# Patient Record
Sex: Male | Born: 1960 | Race: Black or African American | Hispanic: No | Marital: Married | Smoking: Never smoker
Health system: Southern US, Community
[De-identification: ages and names within clinical notes are randomized; demographics above are authoritative.]

## PROBLEM LIST (undated history)

## (undated) DIAGNOSIS — B9681 Helicobacter pylori [H. pylori] as the cause of diseases classified elsewhere: Secondary | ICD-10-CM

## (undated) DIAGNOSIS — M7710 Lateral epicondylitis, unspecified elbow: Secondary | ICD-10-CM

## (undated) DIAGNOSIS — K269 Duodenal ulcer, unspecified as acute or chronic, without hemorrhage or perforation: Secondary | ICD-10-CM

## (undated) DIAGNOSIS — K219 Gastro-esophageal reflux disease without esophagitis: Secondary | ICD-10-CM

## (undated) DIAGNOSIS — J310 Chronic rhinitis: Secondary | ICD-10-CM

## (undated) DIAGNOSIS — F325 Major depressive disorder, single episode, in full remission: Secondary | ICD-10-CM

## (undated) DIAGNOSIS — K824 Cholesterolosis of gallbladder: Secondary | ICD-10-CM

## (undated) DIAGNOSIS — J3489 Other specified disorders of nose and nasal sinuses: Secondary | ICD-10-CM

## (undated) HISTORY — DX: Major depressive disorder, single episode, in full remission: F32.5

## (undated) HISTORY — DX: Duodenal ulcer, unspecified as acute or chronic, without hemorrhage or perforation: K26.9

## (undated) HISTORY — PX: UPPER GASTROINTESTINAL ENDOSCOPY: SHX188

## (undated) HISTORY — DX: Cholesterolosis of gallbladder: K82.4

## (undated) HISTORY — DX: Helicobacter pylori (H. pylori) as the cause of diseases classified elsewhere: B96.81

## (undated) HISTORY — DX: Lateral epicondylitis, unspecified elbow: M77.10

## (undated) HISTORY — DX: Other specified disorders of nose and nasal sinuses: J34.89

## (undated) HISTORY — DX: Chronic rhinitis: J31.0

---
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
## 1997-08-11 ENCOUNTER — Emergency Department (HOSPITAL_COMMUNITY): Admission: EM | Admit: 1997-08-11 | Discharge: 1997-08-11 | Payer: Self-pay | Admitting: Emergency Medicine

## 1997-11-27 ENCOUNTER — Emergency Department (HOSPITAL_COMMUNITY): Admission: EM | Admit: 1997-11-27 | Discharge: 1997-11-27 | Payer: Self-pay | Admitting: Emergency Medicine

## 1998-06-29 ENCOUNTER — Emergency Department (HOSPITAL_COMMUNITY): Admission: EM | Admit: 1998-06-29 | Discharge: 1998-06-29 | Payer: Self-pay | Admitting: Emergency Medicine

## 1998-06-29 ENCOUNTER — Encounter: Payer: Self-pay | Admitting: Emergency Medicine

## 1998-07-27 ENCOUNTER — Emergency Department (HOSPITAL_COMMUNITY): Admission: EM | Admit: 1998-07-27 | Discharge: 1998-07-27 | Payer: Self-pay | Admitting: Emergency Medicine

## 1998-07-27 ENCOUNTER — Encounter: Payer: Self-pay | Admitting: Emergency Medicine

## 1998-10-01 ENCOUNTER — Emergency Department (HOSPITAL_COMMUNITY): Admission: EM | Admit: 1998-10-01 | Discharge: 1998-10-01 | Payer: Self-pay | Admitting: Emergency Medicine

## 1998-10-02 ENCOUNTER — Encounter: Payer: Self-pay | Admitting: Emergency Medicine

## 1999-08-04 ENCOUNTER — Emergency Department (HOSPITAL_COMMUNITY): Admission: EM | Admit: 1999-08-04 | Discharge: 1999-08-04 | Payer: Self-pay | Admitting: Emergency Medicine

## 2000-09-14 ENCOUNTER — Emergency Department (HOSPITAL_COMMUNITY): Admission: EM | Admit: 2000-09-14 | Discharge: 2000-09-14 | Payer: Self-pay

## 2000-11-09 ENCOUNTER — Emergency Department (HOSPITAL_COMMUNITY): Admission: EM | Admit: 2000-11-09 | Discharge: 2000-11-09 | Payer: Self-pay

## 2001-04-21 ENCOUNTER — Encounter: Admission: RE | Admit: 2001-04-21 | Discharge: 2001-04-21 | Payer: Self-pay | Admitting: Family Medicine

## 2001-07-30 ENCOUNTER — Encounter: Admission: RE | Admit: 2001-07-30 | Discharge: 2001-07-30 | Payer: Self-pay | Admitting: Family Medicine

## 2001-11-15 ENCOUNTER — Emergency Department (HOSPITAL_COMMUNITY): Admission: EM | Admit: 2001-11-15 | Discharge: 2001-11-15 | Payer: Self-pay | Admitting: Emergency Medicine

## 2002-02-13 ENCOUNTER — Emergency Department (HOSPITAL_COMMUNITY): Admission: EM | Admit: 2002-02-13 | Discharge: 2002-02-13 | Payer: Self-pay | Admitting: *Deleted

## 2002-03-03 ENCOUNTER — Encounter: Admission: RE | Admit: 2002-03-03 | Discharge: 2002-03-03 | Payer: Self-pay | Admitting: Family Medicine

## 2002-04-11 ENCOUNTER — Encounter: Admission: RE | Admit: 2002-04-11 | Discharge: 2002-04-11 | Payer: Self-pay | Admitting: Family Medicine

## 2002-05-05 ENCOUNTER — Encounter: Admission: RE | Admit: 2002-05-05 | Discharge: 2002-05-05 | Payer: Self-pay | Admitting: Family Medicine

## 2003-02-18 HISTORY — PX: ELBOW SURGERY: SHX618

## 2003-03-28 ENCOUNTER — Emergency Department (HOSPITAL_COMMUNITY): Admission: EM | Admit: 2003-03-28 | Discharge: 2003-03-29 | Payer: Self-pay | Admitting: Emergency Medicine

## 2003-04-30 ENCOUNTER — Emergency Department (HOSPITAL_COMMUNITY): Admission: AD | Admit: 2003-04-30 | Discharge: 2003-05-01 | Payer: Self-pay | Admitting: Emergency Medicine

## 2003-06-20 ENCOUNTER — Emergency Department (HOSPITAL_COMMUNITY): Admission: EM | Admit: 2003-06-20 | Discharge: 2003-06-20 | Payer: Self-pay | Admitting: Emergency Medicine

## 2003-10-31 ENCOUNTER — Emergency Department (HOSPITAL_COMMUNITY): Admission: EM | Admit: 2003-10-31 | Discharge: 2003-11-01 | Payer: Self-pay | Admitting: Emergency Medicine

## 2003-11-06 ENCOUNTER — Emergency Department (HOSPITAL_COMMUNITY): Admission: EM | Admit: 2003-11-06 | Discharge: 2003-11-06 | Payer: Self-pay | Admitting: Emergency Medicine

## 2004-03-20 ENCOUNTER — Ambulatory Visit: Payer: Self-pay | Admitting: Family Medicine

## 2004-04-19 ENCOUNTER — Ambulatory Visit: Payer: Self-pay | Admitting: Family Medicine

## 2004-08-08 ENCOUNTER — Ambulatory Visit: Payer: Self-pay | Admitting: Family Medicine

## 2004-09-09 ENCOUNTER — Ambulatory Visit: Payer: Self-pay | Admitting: Sports Medicine

## 2004-09-27 ENCOUNTER — Ambulatory Visit: Payer: Self-pay | Admitting: Family Medicine

## 2005-04-11 ENCOUNTER — Ambulatory Visit: Payer: Self-pay | Admitting: Family Medicine

## 2006-04-16 DIAGNOSIS — M7710 Lateral epicondylitis, unspecified elbow: Secondary | ICD-10-CM | POA: Insufficient documentation

## 2006-04-16 DIAGNOSIS — E669 Obesity, unspecified: Secondary | ICD-10-CM

## 2006-04-16 DIAGNOSIS — F3289 Other specified depressive episodes: Secondary | ICD-10-CM | POA: Insufficient documentation

## 2006-04-16 DIAGNOSIS — F329 Major depressive disorder, single episode, unspecified: Secondary | ICD-10-CM

## 2006-04-16 DIAGNOSIS — J310 Chronic rhinitis: Secondary | ICD-10-CM

## 2006-04-16 DIAGNOSIS — F528 Other sexual dysfunction not due to a substance or known physiological condition: Secondary | ICD-10-CM

## 2006-04-16 HISTORY — DX: Lateral epicondylitis, unspecified elbow: M77.10

## 2006-04-16 HISTORY — DX: Chronic rhinitis: J31.0

## 2006-04-26 ENCOUNTER — Emergency Department (HOSPITAL_COMMUNITY): Admission: EM | Admit: 2006-04-26 | Discharge: 2006-04-26 | Payer: Self-pay | Admitting: Emergency Medicine

## 2007-05-27 ENCOUNTER — Emergency Department (HOSPITAL_COMMUNITY): Admission: EM | Admit: 2007-05-27 | Discharge: 2007-05-27 | Payer: Self-pay | Admitting: Emergency Medicine

## 2007-06-04 ENCOUNTER — Emergency Department (HOSPITAL_COMMUNITY): Admission: EM | Admit: 2007-06-04 | Discharge: 2007-06-04 | Payer: Self-pay | Admitting: Emergency Medicine

## 2007-06-04 ENCOUNTER — Telehealth: Payer: Self-pay | Admitting: *Deleted

## 2007-06-07 ENCOUNTER — Ambulatory Visit: Payer: Self-pay | Admitting: Sports Medicine

## 2007-06-07 ENCOUNTER — Ambulatory Visit (HOSPITAL_COMMUNITY): Admission: RE | Admit: 2007-06-07 | Discharge: 2007-06-07 | Payer: Self-pay | Admitting: Family Medicine

## 2007-06-07 DIAGNOSIS — R55 Syncope and collapse: Secondary | ICD-10-CM

## 2007-06-08 ENCOUNTER — Ambulatory Visit: Payer: Self-pay | Admitting: Family Medicine

## 2007-06-09 ENCOUNTER — Encounter: Payer: Self-pay | Admitting: Family Medicine

## 2007-06-16 ENCOUNTER — Ambulatory Visit: Payer: Self-pay | Admitting: Family Medicine

## 2007-06-16 ENCOUNTER — Encounter: Payer: Self-pay | Admitting: Family Medicine

## 2007-06-18 ENCOUNTER — Encounter: Payer: Self-pay | Admitting: *Deleted

## 2008-02-01 ENCOUNTER — Emergency Department (HOSPITAL_COMMUNITY): Admission: EM | Admit: 2008-02-01 | Discharge: 2008-02-01 | Payer: Self-pay | Admitting: Emergency Medicine

## 2008-02-23 ENCOUNTER — Ambulatory Visit: Payer: Self-pay | Admitting: Family Medicine

## 2008-02-23 DIAGNOSIS — R1013 Epigastric pain: Secondary | ICD-10-CM

## 2008-02-23 DIAGNOSIS — J3489 Other specified disorders of nose and nasal sinuses: Secondary | ICD-10-CM

## 2008-02-23 HISTORY — DX: Other specified disorders of nose and nasal sinuses: J34.89

## 2009-08-15 ENCOUNTER — Ambulatory Visit: Payer: Self-pay | Admitting: Family Medicine

## 2009-08-15 ENCOUNTER — Inpatient Hospital Stay (HOSPITAL_COMMUNITY): Admission: EM | Admit: 2009-08-15 | Discharge: 2009-08-18 | Payer: Self-pay | Admitting: Emergency Medicine

## 2009-08-17 ENCOUNTER — Encounter: Payer: Self-pay | Admitting: Family Medicine

## 2009-08-22 ENCOUNTER — Ambulatory Visit: Payer: Self-pay | Admitting: Family Medicine

## 2009-08-22 ENCOUNTER — Encounter: Payer: Self-pay | Admitting: Family Medicine

## 2009-08-22 DIAGNOSIS — A048 Other specified bacterial intestinal infections: Secondary | ICD-10-CM | POA: Insufficient documentation

## 2009-08-22 DIAGNOSIS — D649 Anemia, unspecified: Secondary | ICD-10-CM | POA: Insufficient documentation

## 2009-08-22 LAB — CONVERTED CEMR LAB
HCT: 32.5 % — ABNORMAL LOW (ref 39.0–52.0)
Hemoglobin: 10.8 g/dL — ABNORMAL LOW (ref 13.0–17.0)
MCV: 85.1 fL (ref 78.0–100.0)
Platelets: 254 10*3/uL (ref 150–400)
RBC: 3.82 M/uL — ABNORMAL LOW (ref 4.22–5.81)
WBC: 6.4 10*3/uL (ref 4.0–10.5)

## 2009-09-10 ENCOUNTER — Encounter: Payer: Self-pay | Admitting: Family Medicine

## 2009-11-28 ENCOUNTER — Ambulatory Visit: Payer: Self-pay | Admitting: Family Medicine

## 2010-03-21 NOTE — Assessment & Plan Note (Signed)
Summary: hfu,df   Vital Signs:  Patient profile:   50 year old male Weight:      259.7 pounds Temp:     98.3 degrees F oral Pulse rate:   90 / minute BP sitting:   118 / 77  (right arm) Cuff size:   large  Vitals Entered By: Garen Grams LPN (August 22, 8293 3:17 PM) CC: hfu Is Patient Diabetic? No Pain Assessment Patient in pain? no        Primary Care Provider:  Antoine Primas DO  CC:  hfu.  History of Present Illness: 50 yo male with recent hospitalization for GI bleed and found to have gastritis and duodenal ulcers.  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  Pt is to see GI later today to start proper treatment.  Pt states that he has some tenderness but much better than before as well as dizziness but it has improved a lot.  Pt states he is anxious to get back to work.  Pt is eating well, going to the bathroom regularly and stools look more regular.    Habits & Providers  Alcohol-Tobacco-Diet     Tobacco Status: never  Current Medications (verified): 1)  Protonix 40 Mg Tbec (Pantoprazole Sodium) .... Take 1 Tab By Mouth Daily  Allergies (verified): No Known Drug Allergies  Past History:  Past medical, surgical, family and social histories (including risk factors) reviewed, and no changes noted (except as noted below).  Past Medical History: high risk sexual activity Gastritis and duodenal ulcers H yplori infection treated 7/11.  Followed by GI  Past Surgical History: Reviewed history from 04/16/2006 and no changes required. r elbow tendonitis repair - 2005 - 04/19/2004, R. inguinal hernia repair-1982 - 03/03/2002  Family History: Reviewed history from 04/16/2006 and no changes required. Father-died age 50 unknown cause.  Has 52 sisters and brothers (all healthy per his report)., Mother-living, no medical problems.  Social History: Reviewed history from 06/16/2007 and no changes required. Married, has two  daughtesr Restaurant manager, fast food, [place]).  From [place], has lived in [place]. since 1996.  Works at [employer] (heavy lifting)-lays soccer, no dieting.  Received one blood transfusion in past for unknown reason (in [place]).  No T/A/D's.Smoking Status:  never  Review of Systems       denies fever, chills, nausea, vomiting, diarrhea or constipation   Physical Exam  General:  alert, well-developed, well-nourished, and well-hydrated.   Head:  normocephalic and atraumatic.   Eyes:  normal appearancePERRLA,  Ears:  R ear normal and L ear normal.   Mouth:  pharynx pink and moist, no erythema, no exudates, and no lesions.   Lungs:  Normal respiratory effort, chest expands symmetrically. Lungs are clear to auscultation, no crackles or wheezes. Heart:  Normal rate and regular rhythm. S1 and S2 normal without gallop, murmur, click, rub or other extra sounds. Abdomen:  + BS, ttp epigastrum.  Tenderness is mild.  No guarding, no rebound.  No RUQ pain.  No masses or HSM. 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   Orders: [facility]- Est  Level 4 (62130)  Problem # 2:  UNSPECIFIED ANEMIA (ICD-285.9) CBC Hgb stable at 10.8 today, will monitor.  Orders: CBC-[facility] (86578)  [facility]- Est  Level 4 (99214)  Problem # 3:  SYNCOPE (ICD-780.2) Could secondary to anemia. Has been worked up before with holter monitor but was normal.  if continues then may repeat holter monitor.  Has not completely passed out since his admission which was complicated but dehydration at the time.  Orders: CBC-[facility] (16109) [facility]- Est  Level 4 (60454)  Complete Medication List: 1)  Protonix 40 Mg Tbec (Pantoprazole sodium) .... Take 1 tab by mouth daily  Patient Instructions: 1)  It is good to see you 2)  I am going to draw some blood  today.   3)  I want you to go see the stomach Dr. He will give you the treatment for the infection in your stomach.  4)  I wan to see you again in 3-4 weeks to make sure that you are diong better and the dizziness is not continuing.  5)  I am giving you a note for work to return on Monday.

## 2010-03-21 NOTE — Assessment & Plan Note (Signed)
Summary: epigastric pain   Vital Signs:  Patient profile:   50 year old male Height:      77 inches Weight:      253 pounds BMI:     30.11 Temp:     98.8 degrees F oral Pulse rate:   72 / minute BP sitting:   115 / 75  (left arm) Cuff size:   large  Vitals Entered By: Jimmy Footman, CMA (November 28, 2009 8:51 AM) CC: chronic abdominal pain getting worse and more frequent Comments seen in ED because needed a refill on meds.  on prilosec but getting worse   Primary Care Provider:  Antoine Primas DO  CC:  chronic abdominal pain getting worse and more frequent.  History of Present Illness: epigastric pain: Pt here for increased frequency of epigastric pain x 3-4 days.   Pt has had this pain in past and he has treated it with protonix.  He has minimal epigastric pain when he takes protonix as directed.  He ran out of medication approx 1 month ago and the pain has returned.  He went to the ER for refill on his medication the last time that he ran out of his medication.  Pt denies fever.   No change in bowel or bladder.  Pain located only in epigastric area, pain relieved when he holds pressure over the area.  No blood in stool.  No nausea/vomiting.  Habits & Providers  Alcohol-Tobacco-Diet     Tobacco Status: never  Current Medications (verified): 1)  Protonix 40 Mg Tbec (Pantoprazole Sodium) .... Take 1 Tab By Mouth Daily  Allergies (verified): No Known Drug Allergies  Review of Systems       as per hpi  Physical Exam  General:  VSS Well-developed,well-nourished,in no acute distress; alert,appropriate and cooperative throughout examination Lungs:  Normal respiratory effort, chest expands symmetrically. Lungs are clear to auscultation, no crackles or wheezes. Heart:  Normal rate and regular rhythm. S1 and S2 normal without gallop, murmur, click, rub or other extra sounds. Abdomen:  Bowel sounds positive,abdomen soft and non-tender without masses, organomegaly or hernias noted.   + tenderness in epigastric area with palpation.     Impression & Recommendations:  Problem # 1:  EPIGASTRIC PAIN, CHRONIC (ICD-789.06) Assessment Deteriorated  From history and from physical exam the pt appears to be having a recurrance of gastritis. Gave refill of protonix,  pt to take tums as needed for breakthrough reflux/gastritis pain.  Pt given red flags for return. Pt to return in 2-3 weeks to follow up with Dr. Katrinka Blazing.  Pt given flu shot today.   Orders: [facility]- Est Level  3 (04540)  Complete Medication List: 1)  Protonix 40 Mg Tbec (Pantoprazole sodium) .... Take 1 tab by mouth daily  Patient Instructions: 1)  Return in 2-3 weeks to see Dr. Katrinka Blazing for follow up to insure that reflux has improved.  2)  Take protonix as directed, take tums as needed for breakthrough reflux.  3)  Return if pain doesn't improve with the above medications, or if new or worsening symptoms.   Appended Document: epigastric pain FLU SHOT GIVEN TODAY.Jimmy Footman, CMA  November 28, 2009 11:56 AM    Immunizations Administered:  Influenza Vaccine # 1:    Vaccine Type: Fluvax 3+    Site: right deltoid    Mfr: Sanofi Pasteur    Dose: 0.5 ml    Route: IM    Given by: Jimmy Footman, CMA    Exp. Date:  08/14/2010    Lot #: [id number]    VIS given: 09/11/09 version given November 28, 2009.  Flu Vaccine Consent Questions:    Do you have a history of severe allergic reactions to this vaccine? no    Any prior history of allergic reactions to egg and/or gelatin? no    Do you have a sensitivity to the preservative Thimersol? no    Do you have a past history of Guillan-Barre Syndrome? no    Do you currently have an acute febrile illness? no    Have you ever had a severe reaction to latex? no    Vaccine information given and explained to patient? yes

## 2010-03-21 NOTE — Miscellaneous (Signed)
Summary: Application to [facility]  Clinical Lists Changes [name] dropped form off and would like to have ready tomorrow or Wednesday. Tony Nguyen  September 10, 2009 4:27 PM forms to pcp.Golden Circle RN  September 10, 2009 4:49 PM Pt paperwork completed.  Pt contacted and informed that papers were ready for pickup.  Will pick them up on Wednesday. Tony Nguyen  September 11, 2009 4:37 PM

## 2010-03-26 ENCOUNTER — Encounter: Payer: Self-pay | Admitting: *Deleted

## 2010-05-05 LAB — TYPE AND SCREEN
ABO/RH(D): B POS
Antibody Screen: NEGATIVE

## 2010-05-05 LAB — CBC
HCT: 31.3 % — ABNORMAL LOW (ref 39.0–52.0)
Hemoglobin: 10.6 g/dL — ABNORMAL LOW (ref 13.0–17.0)
Hemoglobin: 10.8 g/dL — ABNORMAL LOW (ref 13.0–17.0)
MCH: 30 pg (ref 26.0–34.0)
MCH: 30.5 pg (ref 26.0–34.0)
MCH: 30.3 pg (ref 26.0–34.0)
MCH: 30.4 pg (ref 26.0–34.0)
MCHC: 33.7 g/dL (ref 30.0–36.0)
MCHC: 34.5 g/dL (ref 30.0–36.0)
MCV: 88.9 fL (ref 78.0–100.0)
MCV: 87.8 fL (ref 78.0–100.0)
Platelets: 165 10*3/uL (ref 150–400)
Platelets: 180 10*3/uL (ref 150–400)
Platelets: 190 10*3/uL (ref 150–400)
RBC: 3.55 MIL/uL — ABNORMAL LOW (ref 4.22–5.81)
RBC: 3.74 MIL/uL — ABNORMAL LOW (ref 4.22–5.81)
RBC: 3.98 MIL/uL — ABNORMAL LOW (ref 4.22–5.81)
RDW: 13.2 % (ref 11.5–15.5)
RDW: 13.7 % (ref 11.5–15.5)
RDW: 13.4 % (ref 11.5–15.5)
RDW: 13.5 % (ref 11.5–15.5)
WBC: 8.5 10*3/uL (ref 4.0–10.5)
WBC: 8.9 10*3/uL (ref 4.0–10.5)

## 2010-05-05 LAB — PROTIME-INR
INR: 1.11 (ref 0.00–1.49)
Prothrombin Time: 14.2 seconds (ref 11.6–15.2)

## 2010-05-05 LAB — DIFFERENTIAL
Basophils Absolute: 0 10*3/uL (ref 0.0–0.1)
Eosinophils Relative: 3 % (ref 0–5)
Eosinophils Relative: 3 % (ref 0–5)
Lymphocytes Relative: 21 % (ref 12–46)
Lymphocytes Relative: 39 % (ref 12–46)
Monocytes Absolute: 0.4 10*3/uL (ref 0.1–1.0)
Monocytes Relative: 5 % (ref 3–12)
Neutro Abs: 4.7 10*3/uL (ref 1.7–7.7)
Neutro Abs: 6.1 10*3/uL (ref 1.7–7.7)
Neutrophils Relative %: 53 % (ref 43–77)

## 2010-05-05 LAB — BASIC METABOLIC PANEL
BUN: 11 mg/dL (ref 6–23)
CO2: 27 mEq/L (ref 19–32)
CO2: 24 mEq/L (ref 19–32)
Calcium: 8.8 mg/dL (ref 8.4–10.5)
Calcium: 8.3 mg/dL — ABNORMAL LOW (ref 8.4–10.5)
Creatinine, Ser: 1.08 mg/dL (ref 0.4–1.5)
Creatinine, Ser: 0.98 mg/dL (ref 0.4–1.5)
GFR calc Af Amer: >60 mL/min (ref >60)
GFR calc non Af Amer: >60 mL/min (ref >60)
GFR calc non Af Amer: >60 mL/min (ref >60)
Glucose, Bld: 101 mg/dL — ABNORMAL HIGH (ref 70–99)
Sodium: 139 mEq/L (ref 135–145)

## 2010-05-05 LAB — COMPREHENSIVE METABOLIC PANEL
AST: 19 U/L (ref 0–37)
Albumin: 3.2 g/dL — ABNORMAL LOW (ref 3.5–5.2)
BUN: 23 mg/dL (ref 6–23)
Chloride: 111 mEq/L (ref 96–112)
Creatinine, Ser: 1.17 mg/dL (ref 0.4–1.5)
GFR calc Af Amer: >60 mL/min (ref >60)
Potassium: 4.2 mEq/L (ref 3.5–5.1)
Total Protein: 6.3 g/dL (ref 6.0–8.3)

## 2010-05-05 LAB — ABO/RH: ABO/RH(D): B POS

## 2010-05-05 LAB — APTT: aPTT: 28 seconds (ref 24–37)

## 2010-07-22 ENCOUNTER — Ambulatory Visit (INDEPENDENT_AMBULATORY_CARE_PROVIDER_SITE_OTHER): Payer: PRIVATE HEALTH INSURANCE | Admitting: Family Medicine

## 2010-07-22 ENCOUNTER — Encounter: Payer: Self-pay | Admitting: Family Medicine

## 2010-07-22 VITALS — BP 135/80 | HR 80 | Temp 98.1°F | Wt 261.0 lb

## 2010-07-22 DIAGNOSIS — K279 Peptic ulcer, site unspecified, unspecified as acute or chronic, without hemorrhage or perforation: Secondary | ICD-10-CM

## 2010-07-22 MED ORDER — SUCRALFATE 1 G PO TABS: 1.0000 g | ORAL_TABLET | Freq: Four times a day (QID) | ORAL | Status: DC

## 2010-07-22 MED ORDER — PANTOPRAZOLE SODIUM 40 MG PO TBEC: 40.0000 mg | DELAYED_RELEASE_TABLET | Freq: Two times a day (BID) | ORAL | Status: DC

## 2010-07-22 NOTE — Patient Instructions (Signed)
Peptic Ulcers Ulcers are small, open craters or sores that develop in the lining of the stomach or the duodenum (the first part of the small intestine). The term peptic ulcer is used to describe both types of ulcers. There are a number of treatments that relieve the discomfort of ulcers. In most cases ulcers do heal.   CAUSES AND COMMON FEATURES OF PEPTIC ULCERS   Peptic ulcers occur only in areas of the digestive system that come in contact with digestive juices. These juices are secreted (given off) by the stomach. They include acid and an enzyme called pepsin that breaks down proteins. Many people with duodenal ulcers have too much digestive juice spilling down from the stomach. Most people with gastric (stomach) ulcers have normal or below normal amounts of stomach acid. Sometimes, when the mucous membrane (protective lining) of the stomach and duodenum does not protect well, this may add to the growth of ulcers. Duodenal ulcers often produces pain in a small area between the breastbone and navel. Pain varies from hunger pain to constant gnawing or burning sensations (feeling). Sometimes the pain is felt during sleep and may awaken the person in the middle of the night. Often the pain occurs two or three hours after eating, when the stomach is empty. Other common symptoms (problems) include overeating for pain relief. Eating relieves the pain of a duodenal ulcer. Gastric ulcer pain may be felt in the same place as the pain of a duodenal ulcer, or slightly higher up. There may also be sensations of feeling full, indigestion, and heartburn. Sometimes pain occurs when the stomach is full. This causes loss of appetite followed by weight loss. HOME CARE INSTRUCTIONS  Use of tobacco products have been found to slow down the healing of an ulcer. STOP SMOKING.   Avoid alcohol, aspirin, and other inflammation (swelling and soreness) reducing drugs. These substances weaken the stomach lining.   Eat regular,  nutritious meals.   Avoid foods that bother you.   Take medications and antacids as directed. Over-the-counter medications are used to neutralize stomach acid. Prescription medications reduce acid secretion, block acid production, or provide a protective coating over the ulcer. If a specific antacid was prescribed, do not switch brands without your caregiver's approval.  Surgery is usually not necessary. Diet and/or drug therapy usually is effective. Surgery may be necessary if perforation, obstruction due to scarring, or uncontrollable bleeding is found, or if severe pain is not otherwise controlled. SEEK IMMEDIATE MEDICAL ATTENTION IF:  You see signs of bleeding. This includes vomiting fresh, bright red blood or passing bloody or tarry, black stools.   You suffer weakness, fatigue, or loss of consciousness. These symptoms can result from severe hemorrhaging (bleeding). Shock may result.   You have sudden, intense, severe abdominal (belly) pain. This is the first sign of a perforation. This would require immediate surgical treatment.   You have intense pain and continued vomiting. This could signal an obstruction of the digestive tract.  Document Released: 02/01/2000 Document Re-Released: 05/02/2008 [facility] Patient Information 2011 [place], [place].

## 2010-07-22 NOTE — Progress Notes (Signed)
  Subjective:    Patient ID: Tony Nguyen, male    DOB: [birth year], 50 y.o.   MRN: [id number]  Abdominal Pain This is a new problem. The current episode started in the past 7 days. The onset quality is gradual. The problem occurs constantly. The problem has been unchanged. The pain is located in the epigastric region. The pain is at a severity of 5/10. The quality of the pain is aching. The abdominal pain does not radiate. Pertinent negatives include no anorexia, arthralgias, belching, constipation, diarrhea, dysuria, fever, flatus, frequency, headaches, hematochezia, hematuria, melena, myalgias, nausea, vomiting or weight loss. Associated symptoms comments: + Dizziness. The pain is aggravated by nothing. The pain is relieved by nothing. He has tried proton pump inhibitors for the symptoms. The treatment provided no relief.      Review of Systems  Constitutional: Negative for fever and weight loss.  Gastrointestinal: Positive for abdominal pain. Negative for nausea, vomiting, diarrhea, constipation, melena, hematochezia, anorexia and flatus.  Genitourinary: Negative for dysuria, frequency and hematuria.  Musculoskeletal: Negative for myalgias and arthralgias.  Neurological: Negative for headaches.       Objective:   Physical Exam  Vitals reviewed. Constitutional: He is oriented to person, place, and time. He appears well-nourished. No distress.  Eyes: Conjunctivae are normal.  Neck: Normal range of motion. Neck supple.  Cardiovascular: Normal rate, regular rhythm and normal heart sounds.   Pulmonary/Chest: Effort normal and breath sounds normal. No respiratory distress. He has no wheezes.  Abdominal: Soft. Bowel sounds are normal. He exhibits no distension and no mass. There is tenderness. There is no rebound and no guarding.       Moderate Epigastric tenderness  Lymphadenopathy:    He has no cervical adenopathy.  Neurological: He is alert and oriented to person, place, and  time. No cranial nerve deficit. He exhibits normal muscle tone. Coordination normal.  Skin: Skin is warm and dry. No rash noted.  Psychiatric: He has a normal mood and affect.          Assessment & Plan:

## 2010-07-22 NOTE — Assessment & Plan Note (Signed)
Likely peptic ulcer given hx of similar episode and diagnosis by EGD.  Will increase Protonix to twice a day, will also start Carafate.  Reviewed Precautions.  Patient would likely benefit from follow up with GI (Dr. Elnoria Howard saw him in the hospital).

## 2010-07-24 ENCOUNTER — Emergency Department (HOSPITAL_COMMUNITY)
Admission: EM | Admit: 2010-07-24 | Discharge: 2010-07-24 | Disposition: A | Discharge status: Home / Self Care | Payer: PRIVATE HEALTH INSURANCE | Attending: Emergency Medicine | Admitting: Emergency Medicine

## 2010-07-24 ENCOUNTER — Emergency Department (HOSPITAL_COMMUNITY): Payer: PRIVATE HEALTH INSURANCE

## 2010-07-24 DIAGNOSIS — R10816 Epigastric abdominal tenderness: Secondary | ICD-10-CM | POA: Insufficient documentation

## 2010-07-24 DIAGNOSIS — R109 Unspecified abdominal pain: Secondary | ICD-10-CM | POA: Insufficient documentation

## 2010-07-24 DIAGNOSIS — Z8711 Personal history of peptic ulcer disease: Secondary | ICD-10-CM | POA: Insufficient documentation

## 2010-07-24 LAB — COMPREHENSIVE METABOLIC PANEL
AST: 15 U/L (ref 0–37)
Albumin: 3.6 g/dL (ref 3.5–5.2)
Alkaline Phosphatase: 65 U/L (ref 39–117)
Chloride: 101 mEq/L (ref 96–112)
GFR calc Af Amer: >60 mL/min (ref >60)
Potassium: 3.8 mEq/L (ref 3.5–5.1)
Total Bilirubin: 0.2 mg/dL — ABNORMAL LOW (ref 0.3–1.2)

## 2010-07-24 LAB — CBC
Platelets: 216 10*3/uL (ref 150–400)
RBC: 4.87 MIL/uL (ref 4.22–5.81)
WBC: 5.4 10*3/uL (ref 4.0–10.5)

## 2010-07-24 LAB — DIFFERENTIAL
Basophils Relative: 1 % (ref 0–1)
Eosinophils Absolute: 0.3 10*3/uL (ref 0.0–0.7)
Lymphs Abs: 2.4 10*3/uL (ref 0.7–4.0)
Neutrophils Relative %: 42 % — ABNORMAL LOW (ref 43–77)

## 2010-07-24 LAB — LIPASE, BLOOD: Lipase: 25 U/L (ref 11–59)

## 2010-07-30 ENCOUNTER — Ambulatory Visit (INDEPENDENT_AMBULATORY_CARE_PROVIDER_SITE_OTHER): Payer: PRIVATE HEALTH INSURANCE | Admitting: Family Medicine

## 2010-07-30 VITALS — BP 113/76 | HR 80 | Temp 98.1°F | Ht 77.0 in | Wt 255.0 lb

## 2010-07-30 DIAGNOSIS — K279 Peptic ulcer, site unspecified, unspecified as acute or chronic, without hemorrhage or perforation: Secondary | ICD-10-CM

## 2010-07-30 MED ORDER — RANITIDINE HCL 300 MG PO TABS: 300.0000 mg | ORAL_TABLET | Freq: Every day | ORAL | Status: DC

## 2010-07-30 NOTE — Progress Notes (Signed)
Subjective:     Tony Nguyen is a 50 y.o. male who presents for evaluation of abdominal pain. Onset was 2 weeks ago. Symptoms have been gradually worsening. The pain is described as burning, and is 6/10 in intensity. Pain is located in the epigastric region without radiation.  Aggravating factors: none.  Alleviating factors: none. Associated symptoms: anorexia and melena. The patient denies constipation, diarrhea, hematochezia, hematuria, nausea and vomiting.  The patient's history has been marked as reviewed and updated as appropriate.  Review of Systems Pertinent items are noted in HPI.     Objective:    BP 113/76  Pulse 80  Temp(Src) 98.1 F (36.7 C) (Oral)  Ht 6\' 5"  (1.956 m)  Wt 255 lb (115.667 kg)  BMI 30.24 kg/m2 General appearance: alert, cooperative and appears stated age Lungs: clear to auscultation bilaterally Heart: regular rate and rhythm, S1, S2 normal, no murmur, click, rub or gallop Abdomen: abnormal findings:  epigastric, RUQ and LUQ TTP without rebound or guarding Rectal: normal tone, normal prostate, no masses or tenderness and hemoccult neg    Assessment:    Abdominal pain, likely secondary to peptic ulcer disease v GERD .    Plan:    The diagnosis was discussed with the patient and evaluation and treatment plans outlined. See orders for lab and imaging studies. Follow up with PCP in 2 weeks.  Cont ppi and added h2 (zantac) 300mg  qhs.  Pt referred to GI for EGD for H Pylori testing and evaluation.  ER warnings given.

## 2010-07-30 NOTE — Patient Instructions (Signed)
It was a pleasure to care for you today.  Please take medication as prescribed.   A new medication was added today and take once at bedtime.  Go immediately to the ED if with any fever, chills, nausea, vomiting, severe abdominal pain, black stools or bloody stools, lightheadedness or fainting. Or vomiting blood.  Please return for a follow up visit in 2 weeks for re evaluation.  You will need to make an appointment with the specialist for an EGD to re screen for infection.  Please follow up on that appointment.

## 2010-07-31 ENCOUNTER — Encounter: Payer: Self-pay | Admitting: *Deleted

## 2010-08-15 ENCOUNTER — Other Ambulatory Visit: Payer: Self-pay | Admitting: Gastroenterology

## 2010-08-15 DIAGNOSIS — R1013 Epigastric pain: Secondary | ICD-10-CM

## 2010-08-16 ENCOUNTER — Ambulatory Visit
Admission: RE | Admit: 2010-08-16 | Discharge: 2010-08-16 | Disposition: A | Discharge status: Home / Self Care | Payer: PRIVATE HEALTH INSURANCE | Source: Ambulatory Visit | Attending: Gastroenterology | Admitting: Gastroenterology

## 2010-08-16 DIAGNOSIS — R1013 Epigastric pain: Secondary | ICD-10-CM

## 2010-09-17 ENCOUNTER — Other Ambulatory Visit: Payer: Self-pay | Admitting: Gastroenterology

## 2010-09-25 ENCOUNTER — Encounter (INDEPENDENT_AMBULATORY_CARE_PROVIDER_SITE_OTHER): Payer: Self-pay | Admitting: Surgery

## 2010-09-25 ENCOUNTER — Ambulatory Visit (INDEPENDENT_AMBULATORY_CARE_PROVIDER_SITE_OTHER): Payer: PRIVATE HEALTH INSURANCE | Admitting: Surgery

## 2010-09-25 VITALS — BP 122/84 | HR 60 | Temp 96.8°F | Ht 72.0 in | Wt 255.4 lb

## 2010-09-25 DIAGNOSIS — K824 Cholesterolosis of gallbladder: Secondary | ICD-10-CM

## 2010-09-25 DIAGNOSIS — B9681 Helicobacter pylori [H. pylori] as the cause of diseases classified elsewhere: Secondary | ICD-10-CM

## 2010-09-25 DIAGNOSIS — K269 Duodenal ulcer, unspecified as acute or chronic, without hemorrhage or perforation: Secondary | ICD-10-CM

## 2010-09-25 DIAGNOSIS — A048 Other specified bacterial intestinal infections: Secondary | ICD-10-CM

## 2010-09-25 HISTORY — DX: Cholesterolosis of gallbladder: K82.4

## 2010-09-25 HISTORY — DX: Helicobacter pylori (H. pylori) as the cause of diseases classified elsewhere: B96.81

## 2010-09-25 NOTE — Progress Notes (Signed)
Subjective:     Patient ID: Tony Nguyen, male   DOB: [birth year], 50 y.o.   MRN: [id number]  HPI  Patient has a history of epigastric pain going on for a year. He had a severe episode of pain with nausea last year, so he went to the emergency room. He had loose bowels with black and red changes. He was admitted. And Dr. Elnoria Howard did endoscopy and revealed gastritis. He was placed on antacid meds. It seemed to resolve.  However this year he is getting repeated episodes. It is a burning pain. It does not radiate. It is not associated with eating. It often happens on an empty stomach. He denies any significant heartburn. No reflux. No dysphasia. He normally has one bowel movement a day. No major changes in his bowels. No hematochezia this time. The episodes are happening at night & at work. It's not associated with eating. He normally can eat any food he wants.  No problem with greasy foods, dairy, spicy foods.  He saw Dr. Evette Cristal with [facility] gastroenterology. He had an ultrasound done which showed a polyp within the gallbladder and another mass in there that was probably sludge. He an EGD which revealed duodenal bulb ulcers. He had biopsy showing gastritis and Helicobacter pylori positive. He is taken antibiotics and antacids for 2 weeks. That helped. He stopped all this. The pains come back. He really does not want to take medicines indefinitely.  Dr. Evette Cristal sent the patient [place] for consideration of the abnormal gallbladder.  No sick contacts. Internal history. He had an appendectomy but no other bowel surgery. He is otherwise a moderately active. No cardiac history. He does not smoke.  Review of Systems  Constitutional: Negative for fever, chills and diaphoresis.  HENT: Negative for sore throat and trouble swallowing.   Eyes: Negative for photophobia and visual disturbance.  Respiratory: Negative for choking and shortness of breath.   Cardiovascular: Negative for chest pain and palpitations.    Gastrointestinal: Positive for nausea and abdominal pain. Negative for vomiting, diarrhea, constipation, blood in stool, abdominal distention, anal bleeding and rectal pain.  Genitourinary: Negative for dysuria, urgency, difficulty urinating, penile pain and testicular pain.  Musculoskeletal: Negative for myalgias, back pain, joint swelling, arthralgias and gait problem.  Skin: Negative for color change and rash.  Neurological: Negative for dizziness, speech difficulty, weakness and numbness.  Hematological: Negative for adenopathy.  Psychiatric/Behavioral: Negative for hallucinations, confusion and agitation.       Objective:   Physical Exam  Constitutional: He is oriented to person, place, and time. He appears well-developed and well-nourished. No distress.  HENT:  Head: Normocephalic.  Mouth/Throat: Oropharynx is clear and moist. No oropharyngeal exudate.  Eyes: Conjunctivae and EOM are normal. Pupils are equal, round, and reactive to light. No scleral icterus.  Neck: Normal range of motion. Neck supple. No tracheal deviation present.  Cardiovascular: Normal rate, regular rhythm and intact distal pulses.   Pulmonary/Chest: Effort normal and breath sounds normal. No respiratory distress.  Abdominal: Soft. He exhibits no distension and no mass. There is no rebound and no guarding. Hernia confirmed negative in the right inguinal area and confirmed negative in the left inguinal area.       Moderate epigastric greater than right paramedian tenderness to palpation.  No tenderness over her right upper quadrant.  No Murphy sign. Small umbilical hernia.  Musculoskeletal: Normal range of motion. He exhibits no tenderness.  Lymphadenopathy:    He has no cervical adenopathy.  Right: No inguinal adenopathy present.       Left: No inguinal adenopathy present.  Neurological: He is alert and oriented to person, place, and time. No cranial nerve deficit. He exhibits normal muscle tone.  Coordination normal.  Skin: Skin is warm and dry. No rash noted. He is not diaphoretic. No erythema. No pallor.  Psychiatric: He has a normal mood and affect. His behavior is normal. Judgment and thought content normal.   Studies: As noted above. 7 mm abnormal focusing gallbladder. Looks more like a stone to me that they think is more consistent with sludge. A small gallbladder polyp. Abdominal x-rays negative. Endoscopy as above noted.    Assessment:     [see below]    Plan:     #1: duodenal bulb ulcers and gastritis. I strongly encouraged him to resume his proton pump inhibitor. Call Dr. Arlana Hove who agreed with this. Dr. didn't want to see him again since the patient is still having epigastric symptoms. I caution that the patient may need antacid meds for several months if not indefinitely since this is her repeated problem. Avoid nonsteroidals.  #2: gallbladder polyp and abnormal mass. Most likely this is benign. I discussed with numerous partners. I gave the patient her options. He was aggressive option is to do cholecystectomy since that will determine what is inside the gallbladder US to remove it. There's a very small increased risk of cancer. Catching it early would be helpful. At the very least it should be followed with a repeat Ultrasound in 6 months. Patient is very concerned about having an abnormal gallbladder & wished to have removed.  I did discuss removal gallbladder. I think is reasonable for a single site approach. Technique, risks, benefits, alternatives discussed. Most important, I noted at this point probably not effect his symptoms at all as I didn't think the gallbladder issues are asymptomatic. He understands that. We'll work according to see convenient time.

## 2010-09-25 NOTE — Patient Instructions (Addendum)
1. See Handout on Gallbladder surgery for polyps   2. Ulcer Disease (Peptic Ulcer, Gastric Ulcer, Duodenal Ulcer) You have an ulcer. This is in the first part of your small bowel, the duodenum (duodenal ulcer). An ulcer is a break in the lining of the stomach or duodenum. The ulcer causes erosion into the deeper tissue. CAUSES The stomach has a lining to protect itself from the acid that digests food. The lining can be damaged in two main ways:  The Helico Pylori bacteria (H. Pyolori) can infect the lining of the stomach and cause ulcers.   Nonsteroidal, anti-inflammatory medications (NSAIDS) can cause gastric ulcerations.   Smoking tobacco can increase the acid in the stomach. This can lead to ulcers, and will impair healing of ulcers.  Other factors, such as alcohol use and stress may contribute to ulcer formation. Rarely, a tumor or cancer can cause an ulcer.  SYMPTOMS The problems (symptoms) of ulcer disease are usually a burning or gnawing of the mid-upper belly (abdomen). This is often worse on an empty stomach and may get better with food. This may be associated with feeling sick to your stomach (nausea), bloating, and vomiting. If the ulcer results in bleeding, it can cause:  Black, tarry stools.   Vomiting of bright red blood.   Vomiting coffee-ground-looking materials.  With severe bleeding, there may be loss of consciousness and shock.  DIAGNOSIS Learning what is wrong (diagnosis) is usually made based upon your history and an exam. Medications are so effective that further tests may not be necessary. If needed, other tests may include:  Blood tests, x-rays, or heart tests. These are done to be sure that no other conditions are causing your symptoms.   X-rays (Barium studies) such as an upper GI series.   Most commonly, an upper GI endoscopy can confirm your diagnosis. This is when a flexible tube is passed through the mouth (using sedative medication). The tube is used to  look at the inside of esophagus, stomach, and small bowel. Abnormal pieces of tissue may be removed to examine under the microscope (biopsy).  TREATMENT Bleeding from ulcers can usually be treated via endoscopy. Rarely, surgery is needed for ulcers when bleeding cannot be stopped. Surgery is needed if the ulcer goes through the wall of the stomach or duodenum.  After any bleeding is stopped, medications are the main treatment:  If your ulcer was caused by the bacteria H. Pylori, then you will need antibiotics to kill the infection. Usually, a program involving more than one antibiotic, and a medicine for stomach acid, is prescribed.   Medicine to decrease acid production is used for almost all ulcers. Your caregiver will make a recommendation. They will also tell you how long you must use the medication.   Stop using any medications or substances that may have contributed to your ulcer (alcohol, tobacco, caffeine, for example).   Medications are available that protect the lining of the bowel.  HOME CARE INSTRUCTIONS Continue regular work and usual activities unless advised otherwise by your caregiver.  Avoid tobacco, alcohol, and caffeine. Tobacco use will decrease and slow the rates of healing.   Avoid foods that seem to aggravate or cause discomfort.   There are many over-the-counter products available to control stomach acid and other symptoms. Discuss these with your caregiver before using them. DO NOT substitute over-the-counter medications for prescription medications without discussing with your caregiver.   Special diets are not usually needed.   Keep any follow-up appointments and  blood tests, as directed.  SEEK MEDICAL CARE IF:  Your pain or other ulcer symptoms do not improve within a few days of starting treatment.   You develop diarrhea. This can be a complication of certain treatments.   You have ongoing indigestion or heartburn, even if your main ulcer symptoms are  improved.  SEEK IMMEDIATE MEDICAL CARE IF:  You develop bright red, rectal bleeding; dark black, tarry stools; or vomit blood.   You become light-headed, weak, have fainting episodes, or become sweaty, cold and clammy.   You experience severe abdominal pain not controlled by medications. DO NOT take pain medications unless ordered by your caregiver.  Document Released: 11/13/2004 Document Re-Released: 05/02/2008 [facility] Patient Information 2011 [place], [place].

## 2010-10-09 ENCOUNTER — Encounter (HOSPITAL_COMMUNITY)
Admission: RE | Admit: 2010-10-09 | Discharge: 2010-10-09 | Disposition: A | Discharge status: Home / Self Care | Payer: PRIVATE HEALTH INSURANCE | Source: Ambulatory Visit | Attending: Surgery | Admitting: Surgery

## 2010-10-09 LAB — CBC
HCT: 37.2 % — ABNORMAL LOW (ref 39.0–52.0)
MCH: 28.2 pg (ref 26.0–34.0)
MCHC: 33.6 g/dL (ref 30.0–36.0)
RDW: 13.3 % (ref 11.5–15.5)

## 2010-10-09 LAB — SURGICAL PCR SCREEN: Staphylococcus aureus: NEGATIVE

## 2010-10-15 ENCOUNTER — Ambulatory Visit (HOSPITAL_COMMUNITY)
Admission: RE | Admit: 2010-10-15 | Discharge: 2010-10-16 | Disposition: A | Discharge status: Home / Self Care | Payer: PRIVATE HEALTH INSURANCE | Source: Ambulatory Visit | Attending: Surgery | Admitting: Surgery

## 2010-10-15 ENCOUNTER — Ambulatory Visit (HOSPITAL_COMMUNITY): Payer: PRIVATE HEALTH INSURANCE

## 2010-10-15 ENCOUNTER — Other Ambulatory Visit (INDEPENDENT_AMBULATORY_CARE_PROVIDER_SITE_OTHER): Payer: Self-pay | Admitting: Surgery

## 2010-10-15 DIAGNOSIS — K219 Gastro-esophageal reflux disease without esophagitis: Secondary | ICD-10-CM | POA: Insufficient documentation

## 2010-10-15 DIAGNOSIS — Z01812 Encounter for preprocedural laboratory examination: Secondary | ICD-10-CM | POA: Insufficient documentation

## 2010-10-15 DIAGNOSIS — K429 Umbilical hernia without obstruction or gangrene: Secondary | ICD-10-CM | POA: Insufficient documentation

## 2010-10-15 DIAGNOSIS — K811 Chronic cholecystitis: Secondary | ICD-10-CM

## 2010-10-15 DIAGNOSIS — K824 Cholesterolosis of gallbladder: Secondary | ICD-10-CM | POA: Insufficient documentation

## 2010-10-15 DIAGNOSIS — E669 Obesity, unspecified: Secondary | ICD-10-CM | POA: Insufficient documentation

## 2010-10-15 HISTORY — PX: CHOLECYSTECTOMY: SHX55

## 2010-10-17 NOTE — Op Note (Signed)
Tony Nguyen, Tony Nguyen NO.:  [id number]  MEDICAL RECORD NO.:  [id number]  LOCATION:  5532                         FACILITY:  [facility]  PHYSICIAN:  Tony Sportsman, MD     DATE OF BIRTH:  11/23/1960  DATE OF PROCEDURE:  10/15/2010 DATE OF DISCHARGE:                              OPERATIVE REPORT   PRIMARY CARE PHYSICIAN:  Tony Primas, DO  GASTROENTEROLOGISTS: 1. Tony Shiver, MD 2. Tony Hawks Elnoria Howard, MD  SURGEON:  Tony Sportsman, MD  ASSISTANT:  RN.  PREOPERATIVE DIAGNOSES: 1. Gallbladder polyps with probable chronic cholecystitis. 2. Possible umbilical hernia.  POSTOPERATIVE DIAGNOSIS:  Gallbladder polyps with probable chronic cholecystitis.  PROCEDURE PERFORMED: 1. Laparoscopic cholecystectomy with intraoperative cholangiogram     (single site technique). 2. Primary repair of umbilical hernia.  ANESTHESIA: 1. General anesthesia. 2. Local anesthetic and a field block.  SPECIMENS:  Gallbladder. DRAINS:  None.  ESTIMATED BLOOD LOSS:  Less than 30 mL.  COMPLICATIONS:  None apparent.  INDICATIONS:  Mr. Tony Nguyen is a 50 year old male with history of intermittent epigastric pains.  He has been evaluated by Gastroenterology for history of gastritis and some duodenitis that seems to be rather controlled.  He had a workup showing polyp within the gallbladder and probable sludge.  Gases, reflux, and gastritis seemed to have improved when asked but his postprandial symptoms have not and he has abnormality of his gallbladder.  I was consulted and I recommended consideration of cholecystectomy.  The anatomy or physiology of hepatobiliary and pancreatic function was discussed.  Pathophysiology of gallbladder polyps and chronic cholecystolithiasis was discussed.  Options were discussed. Recommendations were made for removal of gallbladder and study bile ducts.  Techniques, risks, benefits, and alternatives were discussed. Single site laparoscopic and open  techniques were discussed of umbilical hernia to help with future risk of incarceration and strangulation were discussed as well.  Risks, benefits, and alternatives were discussed. Questions were answered and he agreed to proceed.  OPERATIVE FINDINGS:  He had some minor adhesions to the gallbladder and some dilation of the gallbladder concerning for chronic cholecystitis. His biliary system had rather classic anatomy with no evidence of any obstruction or abnormalities.  He had a 1-cm umbilical hernia through the stalk.  DESCRIPTION OF PROCEDURE:  Informed consent was confirmed.  The patient underwent general anesthesia without any difficulty.  He was positioned supine with arms tucked.  He had voided prior to coming to the operating room.  He had sequential compression devices active during the entire care.  His abdomen was clipped, prepped, and draped in sterile fashion. A surgical time-out confirmed our plan.  I placed a #5 long bariatric port through the umbilical stalk hernia using a modified Hasson cut-down technique through a curvilinear incision through the superior circumference in the umbilical fold.  I did camera inspection and it revealed no injury.  I did carbon dioxide insufflation.  There were no adhesions to the anterior abdominal wall. I therefore placed a 5-mm port in the left upper aspect of the wound and a 5-mm grasper in the right inferior aspect of the wound.  I turned attention towards the right upper quadrant.  I found the gallbladder fundus elevated cephalad.  I think he had some greater omentum and mesocolonic adhesions to most of the anterior wall of the gallbladder concerning for chronic cholecystitis.  I elevated that up and freed those off using controlled blunt and focused ultrasonic/harmonic dissection.  Then I was able to free the peritoneum from the gallbladder on its anterior medial wall and posterolateral wall.  I isolated and skeletonized a  dominant posterior cystic artery branch and a smaller right anterior cystic artery branch after skeletonizing and getting a 360-degree good critical view.  I ligated using ultrasound dissection.  I found the lymph node of [place].  I isolated and ligated that and kept that on the gallbladder side.  I did further dissection and freed the proximal third of the gallbladder off the liver bed to get a good critical view of the infundibulum and the cystic duct as it narrowed down.  I made sure I had a good critical view.  I placed a clip in the infundibulum and did a partial cystic ductotomy. I took a #5 [place] through a subperiosteal puncture site and placed it in the cystic duct.  Initially, I could not get it advanced.  I re-did the ductotomy about 6 mm more proximally and was able to achieve the cannulization better.  This was most likely consistent with that was between the two ductotomies.  I ran a cholangiogram using dilute radiopaque contrast and continuous fluoroscopy.  Contrast flowed well from the side branch and refluxed up the common hepatic duct into the right and left intrahepatic chains.  It easily flowed down the common bile duct across the normal ampulla into the normal duodenal bulb without any major abnormalities.  I went ahead and removed the cholangiographic catheter.  I placed 4 clips on the cystic duct just proximal to the most proximal cystic ductotomy.  I completed cystic duct transection using ultrasonic/harmonic dissection.  I freed the gallbladder from its medial attachments on the liver bed just to find a wisp to help elevate the gallbladder fossa.  I used touch point cautery to ensure good hemostasis on the liver bed.  The liver was rather friable and it did have few small peels of that but then I was able to control that by controlling the oozing with the cautery.  I did copious irrigation with excellent hemostasis.  Duodenal bulb, colon, and  other organs appeared normal and not harmed.  I went ahead and removed the gallbladder out the umbilical wound.  I freed the umbilical stalk off the fascia.  I reapproximated the umbilical hernia using 0-Vicryl interrupted stitches with good result.  I closed the skin using 4-0 Monocryl stitch.  Sterile dressings were applied.  The patient was extubated and sent to the recovery room in stable condition.  I discussed the postoperative care with the patient in the office and just prior to surgery in the holding area.  I will discuss with his family as well.     Tony Sportsman, MD     SCG/MEDQ  D:  10/15/2010  T:  10/15/2010  Job:  [id number]  cc:   Tony Nguyen, M.D. Tony Primas, DO  Electronically Signed by Karie Soda MD on 10/17/2010 01:33:58 PM

## 2010-11-04 ENCOUNTER — Encounter (INDEPENDENT_AMBULATORY_CARE_PROVIDER_SITE_OTHER): Payer: Self-pay | Admitting: Surgery

## 2010-11-04 ENCOUNTER — Ambulatory Visit (INDEPENDENT_AMBULATORY_CARE_PROVIDER_SITE_OTHER): Payer: PRIVATE HEALTH INSURANCE | Admitting: Surgery

## 2010-11-04 DIAGNOSIS — K824 Cholesterolosis of gallbladder: Secondary | ICD-10-CM

## 2010-11-04 DIAGNOSIS — K811 Chronic cholecystitis: Secondary | ICD-10-CM

## 2010-11-04 NOTE — Progress Notes (Signed)
Subjective:     Patient ID: Tony Nguyen, male   DOB: 22-Feb-1960, 50 y.o.   MRN: [id number]  HPI  Reason for visit: Followup after laparoscopic cholecystectomy 10/15/2010  Pathology: Chronic cholecystitis. Mucosal fold ?polyp  The patient comes today feeling well. No longer is having the epigastric pains. No nausea nor vomiting. He is having daily bowel movements which is an improvement. No fevers chills nor sweats. Energy level is good. Minimal soreness. He is wanting to get back to work but notes he has a fair amount of heavy lifting and wishes to be careful.  Review of Systems  Constitutional: Negative for fever, chills and diaphoresis.  HENT: Negative for nosebleeds, sore throat, facial swelling, mouth sores, trouble swallowing and ear discharge.   Eyes: Negative for photophobia, discharge and visual disturbance.  Respiratory: Negative for choking, chest tightness, shortness of breath and stridor.   Cardiovascular: Negative for chest pain and palpitations.  Gastrointestinal: Negative for nausea, vomiting, diarrhea, constipation, blood in stool, abdominal distention, anal bleeding and rectal pain.  Genitourinary: Negative for dysuria, urgency, difficulty urinating and testicular pain.  Musculoskeletal: Negative for myalgias, back pain, arthralgias and gait problem.  Skin: Negative for color change, pallor, rash and wound.  Neurological: Negative for dizziness, speech difficulty, weakness, numbness and headaches.  Hematological: Negative for adenopathy. Does not bruise/bleed easily.  Psychiatric/Behavioral: Negative for hallucinations, confusion and agitation.       Objective:   Physical Exam  Constitutional: He is oriented to person, place, and time. He appears well-developed and well-nourished. No distress.  HENT:  Head: Normocephalic.  Mouth/Throat: Oropharynx is clear and moist. No oropharyngeal exudate.  Eyes: Conjunctivae and EOM are normal. Pupils are equal, round,  and reactive to light. No scleral icterus.  Neck: Normal range of motion. Neck supple. No tracheal deviation present.  Cardiovascular: Normal rate, normal heart sounds and intact distal pulses.   Pulmonary/Chest: Effort normal. No respiratory distress.  Abdominal: Soft. He exhibits no distension. There is no tenderness. Hernia confirmed negative in the right inguinal area and confirmed negative in the left inguinal area.       Umb incision clean with normal healing ridge.  No hernias  Musculoskeletal: Normal range of motion. He exhibits no tenderness.  Lymphadenopathy:    He has no cervical adenopathy.       Right: No inguinal adenopathy present.       Left: No inguinal adenopathy present.  Neurological: He is alert and oriented to person, place, and time. No cranial nerve deficit. He exhibits normal muscle tone. Coordination normal.  Skin: Skin is warm and dry. No rash noted. He is not diaphoretic. No erythema. No pallor.  Psychiatric: He has a normal mood and affect. His behavior is normal. Judgment and thought content normal.       Assessment:     Postoperative day #20 status post laparoscopic cholecystectomy. Improving well.    Plan:     Increase activity as tolerated.  Advance diet as tolerated. Avoid heavy and spicy foods.  Followup of duodenal peptic ulcer disease with gastroenterology as needed.  I'm glad he's had a good response. I think he is getting out of a window of any serious post operative risks or issues. He can followup p.r.n. He expressed appreciation.

## 2010-11-12 LAB — CBC
HCT: 43
Hemoglobin: 14.5
MCHC: 33.7
Platelets: 195
RDW: 13.4

## 2010-11-12 LAB — DIFFERENTIAL
Basophils Absolute: 0
Basophils Relative: 1
Eosinophils Absolute: 0.3
Eosinophils Relative: 5
Lymphocytes Relative: 40
Monocytes Absolute: 0.5

## 2010-11-12 LAB — POCT I-STAT, CHEM 8
BUN: 15
Calcium, Ion: 1.2
HCT: 45
Hemoglobin: 15.3
Sodium: 139
TCO2: 26

## 2012-03-08 ENCOUNTER — Encounter (HOSPITAL_COMMUNITY): Payer: Self-pay | Admitting: Emergency Medicine

## 2012-03-08 ENCOUNTER — Emergency Department (HOSPITAL_COMMUNITY)
Admission: EM | Admit: 2012-03-08 | Discharge: 2012-03-08 | Disposition: A | Discharge status: Home / Self Care | Payer: 59 | Attending: Emergency Medicine | Admitting: Emergency Medicine

## 2012-03-08 DIAGNOSIS — Y939 Activity, unspecified: Secondary | ICD-10-CM | POA: Insufficient documentation

## 2012-03-08 DIAGNOSIS — S29019A Strain of muscle and tendon of unspecified wall of thorax, initial encounter: Secondary | ICD-10-CM

## 2012-03-08 DIAGNOSIS — S239XXA Sprain of unspecified parts of thorax, initial encounter: Secondary | ICD-10-CM | POA: Insufficient documentation

## 2012-03-08 DIAGNOSIS — Y9289 Other specified places as the place of occurrence of the external cause: Secondary | ICD-10-CM | POA: Insufficient documentation

## 2012-03-08 DIAGNOSIS — Z79899 Other long term (current) drug therapy: Secondary | ICD-10-CM | POA: Insufficient documentation

## 2012-03-08 DIAGNOSIS — Y990 Civilian activity done for income or pay: Secondary | ICD-10-CM | POA: Insufficient documentation

## 2012-03-08 DIAGNOSIS — X500XXA Overexertion from strenuous movement or load, initial encounter: Secondary | ICD-10-CM | POA: Insufficient documentation

## 2012-03-08 MED ORDER — CYCLOBENZAPRINE HCL 10 MG PO TABS: 10.0000 mg | ORAL_TABLET | Freq: Three times a day (TID) | ORAL | Status: DC | PRN

## 2012-03-08 MED ORDER — HYDROCODONE-ACETAMINOPHEN 5-325 MG PO TABS: 1.0000 | ORAL_TABLET | Freq: Four times a day (QID) | ORAL | Status: DC | PRN

## 2012-03-08 MED ORDER — IBUPROFEN 800 MG PO TABS: 800.0000 mg | ORAL_TABLET | Freq: Three times a day (TID) | ORAL | Status: DC | PRN

## 2012-03-08 NOTE — ED Provider Notes (Signed)
Medical screening examination/treatment/procedure(s) were performed by non-physician practitioner and as supervising physician I was immediately available for consultation/collaboration.   Celene Kras, MD 03/08/12 [phone]

## 2012-03-08 NOTE — ED Provider Notes (Signed)
History     CSN: [id number]  Arrival date & time 03/08/12  1156   First MD Initiated Contact with Patient 03/08/12 1349      Chief Complaint  Patient presents with  . Shoulder Pain    (Consider location/radiation/quality/duration/timing/severity/associated sxs/prior treatment) HPI Patient presents to the emergency department with right posterior shoulder pain, along the shoulder blade.  Patient states that the pain started after he finished working yesterday.  Patient denies any other injury.  The patient denies shortness of breath, chest pain, nausea, vomiting, abdominal pain, back pain fever or dizziness.  Patient states that movement makes the pain, worse.  Patient denies taking anything prior to route for his discomfort. History reviewed. No pertinent past medical history.  Past Surgical History  Procedure Date  . Cholecystectomy 10/15/10  . Elbow surgery 2005    right  . Appendectomy 1981  . Upper gastrointestinal endoscopy     History reviewed. No pertinent family history.  History  Substance Use Topics  . Smoking status: Never Smoker   . Smokeless tobacco: Not on file  . Alcohol Use: No      Review of Systems All other systems negative except as documented in the HPI. All pertinent positives and negatives as reviewed in the HPI. Allergies  Review of patient's allergies indicates no known allergies.  Home Medications   Current Outpatient Rx  Name  Route  Sig  Dispense  Refill  . ACETAMINOPHEN 500 MG PO TABS   Oral   Take 500 mg by mouth every 6 (six) hours as needed. pain         . MULTI-VITAMIN/MINERALS PO TABS   Oral   Take 1 tablet by mouth daily.         Marland Kitchen OMEPRAZOLE 20 MG PO CPDR   Oral   Take 20 mg by mouth daily.           BP 142/76  Pulse 72  Temp 98.5 F (36.9 C) (Oral)  SpO2 100%  Physical Exam  Nursing note and vitals reviewed. Constitutional: He is oriented to person, place, and time. He appears well-developed and  well-nourished.  Cardiovascular: Normal rate, regular rhythm and normal heart sounds.   Pulmonary/Chest: Effort normal and breath sounds normal.  Musculoskeletal:       Cervical back: He exhibits normal range of motion.       Back:  Neurological: He is alert and oriented to person, place, and time. He exhibits normal muscle tone. Coordination normal.  Skin: Skin is warm and dry. No rash noted.    ED Course  Procedures (including critical care time)  Patient has most likely thoracic/shoulder strain.  Patient is advised to use heat on the area.  Told to return if any worsening in his condition.  Also advised followup with his primary care Dr. for recheck.  MDM          Carlyle Dolly, PA-C 03/08/12 [phone]

## 2012-03-08 NOTE — ED Notes (Signed)
States yesterday had acute onset back left shoulder pain, states pain is sharp and constant since yesterday, denies injury, states has not happened before, states when moving arm backwards, increase in pain

## 2012-05-06 ENCOUNTER — Ambulatory Visit (INDEPENDENT_AMBULATORY_CARE_PROVIDER_SITE_OTHER): Payer: 59 | Admitting: Family Medicine

## 2012-05-06 VITALS — BP 153/83 | HR 67 | Temp 98.3°F | Ht 72.0 in | Wt 256.8 lb

## 2012-05-06 MED ORDER — OMEPRAZOLE 20 MG PO CPDR: DELAYED_RELEASE_CAPSULE | ORAL | Status: DC

## 2012-05-06 NOTE — Assessment & Plan Note (Signed)
Chronic condition for patient Consider Dx of PUD Possible H. Pylori. Low likelyhood of pancreatitis or malignancy. WIll restart PPI (prilosec 40mg  for 2 wks then 20mg  daily) and told pt to stop NSAID. If not improving may need to be treated for H. Pylori F/u PCP PRN

## 2012-05-06 NOTE — Progress Notes (Signed)
Abdul-Rashid I Amsler is a 52 y.o. male who presents to [facility] today for abdominal pain   Abdominal pain: started 1 week ago. Gradual onset. Left work early due to pain yesterday. Epigastric. Relieved w/ food and when pushes on epigastric region. Not exacerbated by certain positions, laying down, eating. Worse when feeling like have to pee badly. Improves w/ palpation of epigastric region. Denies fevers, n/v/d/c, hematochezia, hematemasis, hematuria, dysuria, unintentional wt loss. Denies tobacco and ETOH use. Ran out of prilosec in January. Takes 800mg  ibuprofen once a week.    The following portions of the patient's history were reviewed and updated as appropriate: allergies, current medications, past medical history, family and social history, and problem list.  Patient is a nonsmoker   No past medical history on file.  ROS as above otherwise neg.    Medications reviewed. Current Outpatient Prescriptions  Medication Sig Dispense Refill  . ibuprofen (ADVIL,MOTRIN) 800 MG tablet Take 1 tablet (800 mg total) by mouth every 8 (eight) hours as needed for pain.  30 tablet  0  . Multiple Vitamins-Minerals (MULTIVITAMIN WITH MINERALS) tablet Take 1 tablet by mouth daily.      Marland Kitchen omeprazole (PRILOSEC) 20 MG capsule Take 20 mg by mouth daily.       No current facility-administered medications for this visit.    Exam: BP 153/83  Pulse 67  Temp(Src) 98.3 F (36.8 C) (Oral)  Ht 6' (1.829 m)  Wt 256 lb 12.8 oz (116.484 kg)  BMI 34.82 kg/m2 Gen: Well NAD HEENT: EOMI,  MMM Lungs: CTABL Nl WOB Heart: RRR no MRG Abd: Mild tenderness to palpation in the epigastric region. NABS. No rebound, no pain at Mcburny's point or murphy's sign.  Exts: Non edematous BL  LE, warm and well perfused.   No results found for this or any previous visit (from the past 72 hour(s)).

## 2012-05-06 NOTE — Patient Instructions (Addendum)
You likely have irritation of your stomach from too much acid Please restart your acid pill Take 40mg  of Prilosec daily for 2 weeks then decrease to 20mg  daily thereafter If your pain continues please come back for additional testing as you may also have another H. Pylori infection Please stop taking the ibuprofen for the next 2 weeks. Try tylenol instead   Ulcer Disease You have an ulcer. This may be in your stomach (gastric ulcer) or in the first part of your small bowel, the duodenum (duodenal ulcer). An ulcer is a break in the lining of the stomach or duodenum. The ulcer causes erosion into the deeper tissue. CAUSES  The stomach has a lining to protect itself from the acid that digests food. The lining can be damaged in two main ways:  The Helico Pylori bacteria (H. Pyolori) can infect the lining of the stomach and cause ulcers.  Nonsteroidal, anti-inflammatory medications (NSAIDS) can cause gastric ulcerations.  Smoking tobacco can increase the acid in the stomach. This can lead to ulcers, and will impair healing of ulcers. Other factors, such as alcohol use and stress may contribute to ulcer formation. Rarely, a tumor or cancer can cause an ulcer.  SYMPTOMS  The problems (symptoms) of ulcer disease are usually a burning or gnawing of the mid-upper belly (abdomen). This is often worse on an empty stomach and may get better with food. This may be associated with feeling sick to your stomach (nausea), bloating, and vomiting. If the ulcer results in bleeding, it can cause:  Black, tarry stools.  Vomiting of bright red blood.  Vomiting coffee-ground-looking materials. With severe bleeding, there may be loss of consciousness and shock.  DIAGNOSIS  Learning what is wrong (diagnosis) is usually made based upon your history and an exam. Medications are so effective that further tests may not be necessary. If needed, other tests may include:  Blood tests, x-rays, or heart tests. These are  done to be sure that no other conditions are causing your symptoms.  X-rays (Barium studies) such as an upper GI series.  Most commonly, an upper GI endoscopy can confirm your diagnosis. This is when a flexible tube is passed through the mouth (using sedative medication). The tube is used to look at the inside of esophagus, stomach, and small bowel. Abnormal pieces of tissue may be removed to examine under the microscope (biopsy). TREATMENT  Bleeding from ulcers can usually be treated via endoscopy. Rarely, surgery is needed for ulcers when bleeding cannot be stopped. Surgery is needed if the ulcer goes through the wall of the stomach or duodenum.  After any bleeding is stopped, medications are the main treatment:  If your ulcer was caused by the bacteria H. Pylori, then you will need antibiotics to kill the infection. Usually, a program involving more than one antibiotic, and a medicine for stomach acid, is prescribed.  Medicine to decrease acid production is used for almost all ulcers. Your caregiver will make a recommendation. They will also tell you how long you must use the medication.  Stop using any medications or substances that may have contributed to your ulcer (alcohol, tobacco, caffeine, for example).  Medications are available that protect the lining of the bowel. HOME CARE INSTRUCTIONS   Continue regular work and usual activities unless advised otherwise by your caregiver.  Avoid tobacco, alcohol, and caffeine. Tobacco use will decrease and slow the rates of healing.  Avoid foods that seem to aggravate or cause discomfort.  There are many over-the-counter  products available to control stomach acid and other symptoms. Discuss these with your caregiver before using them. DO NOT substitute over-the-counter medications for prescription medications without discussing with your caregiver.  Special diets are not usually needed.  Keep any follow-up appointments and blood tests, as  directed. SEEK MEDICAL CARE IF:   Your pain or other ulcer symptoms do not improve within a few days of starting treatment.  You develop diarrhea. This can be a complication of certain treatments.  You have ongoing indigestion or heartburn, even if your main ulcer symptoms are improved. SEEK IMMEDIATE MEDICAL CARE IF:   You develop bright red, rectal bleeding; dark black, tarry stools; or vomit blood.  You become light-headed, weak, have fainting episodes, or become sweaty, cold and clammy.  You experience severe abdominal pain not controlled by medications. DO NOT take pain medications unless ordered by your caregiver. Document Released: 11/13/2004 Document Revised: 04/28/2011 Document Reviewed: 10/01/2006 [facility] Patient Information 2013 [place], [place].

## 2012-11-18 ENCOUNTER — Encounter: Payer: Self-pay | Admitting: Family Medicine

## 2012-11-18 ENCOUNTER — Ambulatory Visit (INDEPENDENT_AMBULATORY_CARE_PROVIDER_SITE_OTHER): Payer: Commercial Managed Care - PPO | Admitting: Family Medicine

## 2012-11-18 VITALS — BP 126/78 | HR 82 | Ht 72.0 in | Wt 251.0 lb

## 2012-11-18 DIAGNOSIS — R7309 Other abnormal glucose: Secondary | ICD-10-CM

## 2012-11-18 DIAGNOSIS — A048 Other specified bacterial intestinal infections: Secondary | ICD-10-CM

## 2012-11-18 DIAGNOSIS — Z23 Encounter for immunization: Secondary | ICD-10-CM

## 2012-11-18 DIAGNOSIS — D649 Anemia, unspecified: Secondary | ICD-10-CM

## 2012-11-18 DIAGNOSIS — K269 Duodenal ulcer, unspecified as acute or chronic, without hemorrhage or perforation: Secondary | ICD-10-CM

## 2012-11-18 DIAGNOSIS — F3289 Other specified depressive episodes: Secondary | ICD-10-CM

## 2012-11-18 DIAGNOSIS — F329 Major depressive disorder, single episode, unspecified: Secondary | ICD-10-CM

## 2012-11-18 DIAGNOSIS — R252 Cramp and spasm: Secondary | ICD-10-CM

## 2012-11-18 DIAGNOSIS — R1013 Epigastric pain: Secondary | ICD-10-CM

## 2012-11-18 DIAGNOSIS — B9681 Helicobacter pylori [H. pylori] as the cause of diseases classified elsewhere: Secondary | ICD-10-CM

## 2012-11-18 LAB — COMPREHENSIVE METABOLIC PANEL
ALT: 15 U/L (ref 0–53)
AST: 16 U/L (ref 0–37)
Albumin: 4 g/dL (ref 3.5–5.2)
Alkaline Phosphatase: 57 U/L (ref 39–117)
Glucose, Bld: 89 mg/dL (ref 70–99)
Potassium: 4.4 mEq/L (ref 3.5–5.3)
Sodium: 140 mEq/L (ref 135–145)
Total Bilirubin: 0.5 mg/dL (ref 0.3–1.2)
Total Protein: 7.1 g/dL (ref 6.0–8.3)

## 2012-11-18 LAB — POCT GLYCOSYLATED HEMOGLOBIN (HGB A1C): Hemoglobin A1C: 5.9

## 2012-11-18 LAB — CBC
MCH: 28.6 pg (ref 26.0–34.0)
MCHC: 34.4 g/dL (ref 30.0–36.0)
MCV: 83.3 fL (ref 78.0–100.0)
Platelets: 233 10*3/uL (ref 150–400)
RBC: 4.68 MIL/uL (ref 4.22–5.81)

## 2012-11-18 NOTE — Patient Instructions (Addendum)
Tony Nguyen,  Thank you for coming in to see me today. Your exam is normal.  1. We are getting some screening blood work today as well as blood work to evaluate your leg cramping.  2. Please call to schedule your colonoscopy.  3. Regarding your stomach this chronic pain actually seems to be muscle pain, for this I recommend tylenol. Also colonoscopy to make sure there is no colon process.    I am sorry to hear of you separation, but glad that you are making positive moves towards peace in your life. [facility] [place] is a [employer] in our community. Please call if you are interested in personal or family services.  I will be in touch with blood work results.   Plan to f/u in 2 months to re-evaluate your stomach pain.   Dr. Armen Pickup

## 2012-11-18 NOTE — Assessment & Plan Note (Signed)
Presumably secondary to duodenal ulcers and gastritis.  Plan to recheck CBC today.

## 2012-11-18 NOTE — Assessment & Plan Note (Signed)
History depressive disorder. Negative screen today. Currently separating from his wife.  Provided information sheet on [facility] [place] for counseling services if needed.

## 2012-11-18 NOTE — Assessment & Plan Note (Signed)
This is a new problem patient with elevated blood sugars in the past however screening A1c was normal today. Perhaps this is related to poor sleep hygiene and excessive work schedule.  Counseled patient on recommended sleep time of 7-8 hours. Will check electrolytes.

## 2012-11-18 NOTE — Progress Notes (Signed)
  Subjective:    Patient ID: Tony Nguyen, male    DOB: 1960/04/20, 52 y.o.   MRN: [id number]  Abdominal Pain   52 year old male with a history of chronic epigastric pain and EGD confirmed duodenal ulcers In 2011 presents for physical to discuss the following:  #1 chronic epigastric pain: Patient still has sharp epigastric pain that does not radiate. No associated weight loss, nausea vomiting, reflux, diarrhea, constipation, melena. Patient status post cholecystectomy. He continues to takes PPI. He reports that the pain is improved when he applies pressure to the epigastric area.  #2 posterior thigh cramping: Patient reports intermittent severe posterior thigh cramping at night. He reports that he works a 70+ work week. He sleeps to 4 hours a night due to schedule. He reports that his diet is well rounded. He does have a history of hyperglycemia but no history of diabetes, hypertension or hyperlipidemia. He is a chronic nonsmoker.  #3 separation from wife: Patient is currently in the process of separating from his wife. He reports stress related to situation. His 2 young children age 57 and 39. He feels that the separation is the best thing for him and his family. He denies depressive symptoms.  #4 health maintenance patient is due for colonoscopy and flu shot. Review of Systems  Gastrointestinal: Positive for abdominal pain.   Patient Information Form: Screening and ROS  AUDIT-C Score: 0 Do you feel safe in relationships? yes PHQ-2:negative  Review of Symptoms  General:  Negative for nexplained weight loss, fever Skin: Negative for new or changing mole, sore that won't heal HEENT: Positive for trouble sitting sometimes. Negative for trouble hearing, ringing in ears, mouth sores, hoarseness, change in voice, dysphagia. CV:  Negative for chest pain, dyspnea, edema, palpitations Resp: Negative for cough, dyspnea, hemoptysis GI: Negative for nausea, vomiting, diarrhea,  constipation, abdominal pain, melena, hematochezia. GU: Negative for dysuria, incontinence, urinary hesitance, hematuria, vaginal or penile discharge, polyuria, sexual difficulty, lumps in testicle or breasts MSK: Positive for muscle cramps in the posterior thighs that occurred night. Negative for joint pain or swelling Neuro: Positive for headaches. Negative for weakness, numbness, dizziness, passing out/fainting Psych: Negative for depression, anxiety, memory problems    Objective: BP 126/78  Pulse 82  Ht 6' (1.829 m)  Wt 251 lb (113.853 kg)  BMI 34.03 kg/m2    Physical Exam  Constitutional: He is oriented to person, place, and time. He appears well-developed and well-nourished. No distress.  HENT:  Head: Normocephalic and atraumatic.  Right Ear: External ear normal.  Left Ear: External ear normal.  Nose: Nose normal.  Mouth/Throat: Oropharynx is clear and moist.  Eyes: Pupils are equal, round, and reactive to light.  Neck: Normal range of motion. Neck supple. No thyromegaly present.  Cardiovascular: Normal rate, regular rhythm, normal heart sounds and intact distal pulses.  Exam reveals no gallop and no friction rub.   No murmur heard. Pulmonary/Chest: Effort normal and breath sounds normal.  Abdominal: Soft. Bowel sounds are normal. He exhibits no distension and no mass. There is no tenderness. There is no rebound and no guarding.  Musculoskeletal: Normal range of motion. He exhibits no edema and no tenderness.  Neurological: He is alert and oriented to person, place, and time. No cranial nerve deficit.  Skin: Skin is warm and dry.  Psychiatric: He has a normal mood and affect. His behavior is normal. Judgment and thought content normal.      Assessment & Plan:

## 2012-11-19 ENCOUNTER — Encounter: Payer: Self-pay | Admitting: Family Medicine

## 2013-01-07 ENCOUNTER — Ambulatory Visit (INDEPENDENT_AMBULATORY_CARE_PROVIDER_SITE_OTHER): Payer: Commercial Managed Care - PPO | Admitting: Family Medicine

## 2013-01-07 ENCOUNTER — Other Ambulatory Visit: Payer: Self-pay | Admitting: Family Medicine

## 2013-01-07 ENCOUNTER — Encounter: Payer: Self-pay | Admitting: Family Medicine

## 2013-01-07 VITALS — BP 120/71 | HR 80 | Temp 99.0°F | Ht 72.0 in | Wt 246.0 lb

## 2013-01-07 DIAGNOSIS — L299 Pruritus, unspecified: Secondary | ICD-10-CM | POA: Insufficient documentation

## 2013-01-07 MED ORDER — HYDROXYZINE HCL 10 MG PO TABS: 10.0000 mg | ORAL_TABLET | Freq: Three times a day (TID) | ORAL | Status: DC | PRN

## 2013-01-07 MED ORDER — DIPHENHYDRAMINE-ZINC ACETATE 1-0.1 % EX CREA: TOPICAL_CREAM | Freq: Three times a day (TID) | CUTANEOUS | Status: DC | PRN

## 2013-01-07 NOTE — Assessment & Plan Note (Signed)
A: localized L back itching with evidence of excoriation. Dry skin. P: Topical emollient Hydroxyzine prn Benadryl cream prn

## 2013-01-07 NOTE — Patient Instructions (Signed)
Mr. Humble,  Thank you for coming in today. Please take the prescribed medication for itching. There is no rash.  Keeping skin well moisturized will help minimize itching: Eucerin, cerave or Vaseline are good moisturizers.   Call if itching persist.  Dr. Armen Pickup

## 2013-01-07 NOTE — Progress Notes (Signed)
  Subjective:    Patient ID: Tony Nguyen, male    DOB: 1960/08/28, 52 y.o.   MRN: [id number]  HPI 52 yo M presents for SD visit:  1. Itching: L back x one week. No travel, pets, new products, new medications, rash. Itch occurs throughout day and a bit worse at night. Daughters do not itch. No treatment, just scratching.    Review of Systems As per HPI     Objective:   Physical Exam BP 120/71  Pulse 80  Temp(Src) 99 F (37.2 C) (Oral)  Ht 6' (1.829 m)  Wt 246 lb (111.585 kg)  BMI 33.36 kg/m2 General appearance: alert, cooperative and no distress Skin: excoriation - trunk L posterior. No trunk or L axillary adenopathy. Skin is overall moderately xerotic.     Assessment & Plan:

## 2013-05-12 ENCOUNTER — Ambulatory Visit (INDEPENDENT_AMBULATORY_CARE_PROVIDER_SITE_OTHER): Payer: Commercial Managed Care - PPO | Admitting: Family Medicine

## 2013-05-12 ENCOUNTER — Encounter: Payer: Self-pay | Admitting: Family Medicine

## 2013-05-12 VITALS — BP 114/73 | HR 76 | Temp 98.1°F | Ht 72.0 in | Wt 259.9 lb

## 2013-05-12 DIAGNOSIS — R42 Dizziness and giddiness: Secondary | ICD-10-CM | POA: Insufficient documentation

## 2013-05-12 MED ORDER — MECLIZINE HCL 32 MG PO TABS: 32.0000 mg | ORAL_TABLET | Freq: Three times a day (TID) | ORAL | Status: DC | PRN

## 2013-05-12 NOTE — Progress Notes (Signed)
Patient ID: Tony Nguyen, male   DOB: 08/15/1960, 53 y.o.   MRN: [id number]    Subjective: HPI: Patient is a 53 y.o. male presenting to clinic today for dizziness x3 days.  Feels room spinning around him. Blurred vision when trying to focus.  Has had this before, resolved on its own. No problems with with blood pressure. Not orthostatic today He reports a headache (frontal); he has taken Tylenol and Aleve.  Has a history of seasonal allergies. Takes Zyrtec. Reports nausea and epigastric pain. No vomiting. No fevers, no recent URI or flu. Reports some intermittent black vision when he is really dizzy, resolves on its own.  No new medications, no tinnitus, no ear pain.    History Reviewed: Never smoker. Health Maintenance: UTD on medications  ROS: Please see HPI above.  Objective: Office vital signs reviewed. BP 114/73  Pulse 76  Temp(Src) 98.1 F (36.7 C) (Oral)  Ht 6' (1.829 m)  Wt 259 lb 14.4 oz (117.89 kg)  BMI 35.24 kg/m2  Physical Examination:  General: Awake, alert. NAD HEENT: Atraumatic, normocephalic. MMM. Posterior pharynx erythematous. TM slightly budging. Mild frontal tenderness. EOM intact. Neck: No masses palpated. No LAD Pulm: CTAB, no wheezes Cardio: RRR, no murmurs appreciated Extremities: No edema Neuro: Strength and sensation grossly intact. CN intact without and weakness. No nystagmus.  Assessment: 53 y.o. male with vertigo  Plan: See Problem List and After Visit Summary

## 2013-05-12 NOTE — Assessment & Plan Note (Addendum)
DDx includes BPPV, eustachian tube dysfunction, seasonal allergy flare, viral infection, labyrinthitis. 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 Can return to work on 05/16/13. - Meclizine prn symptoms - Water and rest - F/u if not improved, spikes fever, has syncope or worsening headache

## 2013-05-12 NOTE — Patient Instructions (Signed)
Vertigo Vertigo means you feel like you or your surroundings are moving when they are not. Vertigo can be dangerous if it occurs when you are at work, driving, or performing difficult activities.  CAUSES  Vertigo occurs when there is a conflict of signals sent to your brain from the visual and sensory systems in your body. There are many different causes of vertigo, including:  Infections, especially in the inner ear.  A bad reaction to a drug or misuse of alcohol and medicines.  Withdrawal from drugs or alcohol.  Rapidly changing positions, such as lying down or rolling over in bed.  A migraine headache.  Decreased blood flow to the brain.  Increased pressure in the brain from a head injury, infection, tumor, or bleeding. SYMPTOMS  You may feel as though the world is spinning around or you are falling to the ground. Because your balance is upset, vertigo can cause nausea and vomiting. You may have involuntary eye movements (nystagmus). DIAGNOSIS  Vertigo is usually diagnosed by physical exam. If the cause of your vertigo is unknown, your caregiver may perform imaging tests, such as an MRI scan (magnetic resonance imaging). TREATMENT  Most cases of vertigo resolve on their own, without treatment. Depending on the cause, your caregiver may prescribe certain medicines. If your vertigo is related to body position issues, your caregiver may recommend movements or procedures to correct the problem. In rare cases, if your vertigo is caused by certain inner ear problems, you may need surgery. HOME CARE INSTRUCTIONS   Follow your caregiver's instructions.  Avoid driving.  Avoid operating heavy machinery.  Avoid performing any tasks that would be dangerous to you or others during a vertigo episode.  Tell your caregiver if you notice that certain medicines seem to be causing your vertigo. Some of the medicines used to treat vertigo episodes can actually make them worse in some people. SEEK  IMMEDIATE MEDICAL CARE IF:   Your medicines do not relieve your vertigo or are making it worse.  You develop problems with talking, walking, weakness, or using your arms, hands, or legs.  You develop severe headaches.  Your nausea or vomiting continues or gets worse.  You develop visual changes.  A family member notices behavioral changes.  Your condition gets worse. MAKE SURE YOU:  Understand these instructions.  Will watch your condition.  Will get help right away if you are not doing well or get worse. Document Released: 11/13/2004 Document Revised: 04/28/2011 Document Reviewed: 08/22/2010 [employer] Patient Information 2014 [employer].  

## 2013-05-13 ENCOUNTER — Other Ambulatory Visit: Payer: Self-pay | Admitting: Family Medicine

## 2013-05-13 MED ORDER — MECLIZINE HCL 25 MG PO TABS: 25.0000 mg | ORAL_TABLET | Freq: Three times a day (TID) | ORAL | Status: DC | PRN

## 2013-09-05 ENCOUNTER — Encounter: Payer: Self-pay | Admitting: Family Medicine

## 2013-09-05 ENCOUNTER — Ambulatory Visit (INDEPENDENT_AMBULATORY_CARE_PROVIDER_SITE_OTHER): Payer: Commercial Managed Care - PPO | Admitting: Family Medicine

## 2013-09-05 VITALS — BP 133/87 | HR 77 | Temp 98.2°F | Wt 248.0 lb

## 2013-09-05 DIAGNOSIS — R5383 Other fatigue: Secondary | ICD-10-CM

## 2013-09-05 DIAGNOSIS — R5381 Other malaise: Secondary | ICD-10-CM

## 2013-09-05 DIAGNOSIS — R42 Dizziness and giddiness: Secondary | ICD-10-CM

## 2013-09-05 LAB — CBC
HCT: 36.9 % — ABNORMAL LOW (ref 39.0–52.0)
Hemoglobin: 12.5 g/dL — ABNORMAL LOW (ref 13.0–17.0)
MCH: 28.9 pg (ref 26.0–34.0)
MCHC: 33.9 g/dL (ref 30.0–36.0)
MCV: 85.2 fL (ref 78.0–100.0)
PLATELETS: 194 10*3/uL (ref 150–400)
RBC: 4.33 MIL/uL (ref 4.22–5.81)
RDW: 13.8 % (ref 11.5–15.5)
WBC: 5.3 10*3/uL (ref 4.0–10.5)

## 2013-09-05 MED ORDER — DOXYCYCLINE HYCLATE 100 MG PO TABS: 100.0000 mg | ORAL_TABLET | Freq: Two times a day (BID) | ORAL | Status: DC

## 2013-09-05 MED ORDER — ONDANSETRON 8 MG PO TBDP
4.0000 mg | ORAL_TABLET | Freq: Three times a day (TID) | ORAL | Status: DC | PRN
Start: 2013-09-05 — End: 2015-05-02

## 2013-09-05 NOTE — Progress Notes (Signed)
   Subjective:    Patient ID: Tony Nguyen, male    DOB: 03/16/1960, 53 y.o.   MRN: [id number]  HPI: Pt presents to clinic for headache, fatigue, joint pain, and dizziness. Current symptoms started several days ago "out of the blue." - describes a feeling of lightheadedness with intermittent blurred vision but NO room-spinning sensation - joint pain is in knees, elbows, and shoulders, aching - denies recent camping, insect bites, rashes - denies syncope, chest pain, SOB, N/V, sweating, or ringing in his ears; also denies falls - nothing makes symptoms better and he has not taken any medications for it or any regular medications, currently - standing makes his symptoms worse - pt reports history of vertigo but states this feels different; has taken meclazine in the past (per office note March 2015, symptoms suggested more room-spinning, etc)  Review of Systems: As above.     Objective:   Physical Exam BP 133/87  Pulse 77  Temp(Src) 98.2 F (36.8 C) (Oral)  Wt 248 lb (112.492 kg) Gen: well-appearing adult male in NAD, but does appear fatigued HEENT: [place]/AT, EOMI, MMM, TM's clear bilaterally  Mild posterior oropharynx erythema, no significant anterior cervical lymphadenopathy  Some moderate tenderness to direct palpation over frontal sinuses but not over maxillary sinuses Cardio: RRR, no murmur appreciated Pulm: CTAB, no wheezes, normal WOB MSK: normal ROM to all four extremities, no joint effusions or deformities noted  Some achiness / increased fatigue worse with active ROM after exam Neuro: alert / oriented, no gross focal deficits, with intact strength / sensation throughout extremities  No reproduction of symptoms with changes in position other than standing  Pt able to stand / walk without assistance though does report symptoms with standing Skin: NO rashes noted to palms, soles, hands / feet, extremities, or trunk     Assessment & Plan:  52yo with hx of vertigo but  now with vague symptoms of fatigue and dizziness of sudden onset - uncertain if this could reflect a viral sinusitis / otitis or URI-type illness, given sinus tenderness - possible RMSF given bodyaches, headaches, and weakness but NO reported recent contact with ticks or excursions outdoors, no rash - possible autonomic instability but unlikely given suddenness of onset and history otherwise unremarkable for frank neuro deficits - does not appear dehydrated and doubt hypotension playing a factor; neuro exam otherwise normal  Plan: broad differential as above, but will check labs to eval for end-organ function and anemia (BMP and CBC) - will f/u results as necessary; note and results to be forwarded to Dr. McDiarmid (PCP) Lorain ChildesFYI - given possibility of RMSF will treat with doxycycline for 14 days, though doubt this is the cause and will defer checking titres - instructed pt to f/u by the end of this week if he continues to feel bad or if his symptoms worsen - otherwise recommended supportive care (push fluids, Tylenol for fever / body aches, etc)  The above note reflects an HPI obtained with the assistance of A. Goins, MS4, with additions / clarifications based on my independent interview. The above also reflects my independent exam and assessment/plan.  Bobbye Mortonhristopher M Jameson Morrow, MD PGY-3, [facility] 09/05/2013, 10:46 PM

## 2013-09-05 NOTE — Patient Instructions (Signed)
Thank you for coming in, today!  I'm not sure what is causing your symptoms. It could be from dehydration and/or a sinus-type infection, or it could be "[facility] Spotted Fever."  You can read more about it, below. You don't have the rash we expect, but people don't always see a rash or a tick bite.  You should take a medicine called doxycycline twice a day for 2 weeks. It may make you nauseated -- if it does, you can take Zofran as needed every 6-8 hours.  If you feel worse over the next several days, come back to be re-evaluated Friday or next week. It may take a few weeks to a month or so to feel completely better.  Come back to see Dr. McDiarmid as you need, otherwise. Please feel free to call with any questions or concerns at any time, at [phone]. --Dr. Casper Harrison  [facility] Fever [facility] Spotted Fever (RMSF) is the oldest known tick-borne disease of people in the [place]. This disease was named because it was first described among people in the [facility] area who had an illness characterized by a rash with red-purple-black spots. This disease is caused by a rickettsia (Rickettsia rickettsii), a bacteria carried by the tick. The [facility] wood tick and the American dog tick, acquire and transmit the RMSF bacteria (pictures NOT actual size). When a larval, nymphal or adult tick feeds on an infected rodent or larger animal, the tick can become infected. Infected adult ticks then feed on people who may then get RMSF. The tick transmits the disease to humans during a prolonged period of feeding that lasts many hours, days or even a couple weeks. The bite is painless and frequently goes unnoticed. An infected male tick may also pass the rickettsial bacteria to her eggs that then may mature to be infected adult ticks. The rickettsia that causes RMSF can also get into a person's body through damaged skin. A tick bite is not necessary. People can get RMSF if  they crush a tick and get it's blood or body fluids on their skin through a small cut or sore.  DIAGNOSIS Diagnosis is made by laboratory tests.  TREATMENT Treatment is with antibiotics (medications that kill rickettsia and other bacteria). Immediate treatment usually prevents death. GEOGRAPHIC RANGE This disease was reported only in the [facility] until 1931. RMSF has more recently been described among individuals in all states except [place], [place] and [place]. The highest reported incidences of RMSF now occur among residents of [place], [place], [place] and [address]. TIME OF YEAR  Most cases are diagnosed during late spring and summer when ticks are most active. However, especially in the warmer [place] states, a few cases occur during the winter. SYMPTOMS   Symptoms of RMSF begin from 2 to 14 days after a tick bite. The most common early symptoms are fever, muscle aches and headache followed by nausea (feeling sick to your stomach) or vomiting.  The RMSF rash is typically delayed until 3 or more days after symptom onset, and eventually develops in 9 of 10 infected patients by the 5th day of illness. If the disease is not treated it can cause death. If you get a fever, headache, muscle aches, rash, nausea or vomiting within 2 weeks of a possible tick bite or exposure you should see your caregiver immediately. PREVENTION Ticks prefer to hide in shady, moist ground litter. They can often be found above the ground clinging to tall grass, brush,  shrubs and low tree branches. They also inhabit lawns and gardens, especially at the edges of woodlands and around old stone walls. Within the areas where ticks generally live, no naturally vegetated area can be considered completely free of infected ticks. The best precaution against RMSF is to avoid contact with soil, leaf litter and vegetation as much as possible in tick infested areas. For those who enjoy gardening or walking in their yards,  clear brush and mow tall grass around houses and at the edges of gardens. This may help reduce the tick population in the immediate area. Applications of chemical insecticides by a licensed professional in the spring (late May) and Fall (September) will also control ticks, especially in heavily infested areas. Treatment will never get rid of all the ticks. Getting rid of small animal populations that host ticks will also decrease the tick population. When working in the garden, Mattel[place]uning shrubs, or handling soil and vegetation, wear light-colored protective clothing and gloves. Spot-check often to prevent ticks from reaching the skin. Ticks cannot jump or fly. They will not drop from an above-ground perch onto a passing animal. Once a tick gains access to human skin it climbs upward until it reaches a more protected area. For example, the back of the knee, groin, navel, armpit, ears or nape of the neck. It then begins the slow process of embedding itself in the skin. Campers, hikers, field workers, and others who spend time in wooded, brushy or tall grassy areas can avoid exposure to ticks by using the following precautions:  Wear light-colored clothing with a tight weave to spot ticks more easily and prevent contact with the skin.  Wear long pants tucked into socks, long-sleeved shirts tucked into pants and enclosed shoes or boots along with insect repellent.  Spray clothes with insect repellent containing either DEET or Permethrin. Only DEET can be used on exposed skin. Follow the manufacturer's directions carefully.  Wear a hat and keep long hair pulled back.  Stay on cleared, well-worn trails whenever possible.  Spot-check yourself and others often for the presence of ticks on clothes. If you find one, there are likely to be others. Check thoroughly.  Remove clothes after leaving tick-infested areas. If possible, wash them to eliminate any unseen ticks. Check yourself, your children and any pets  from head to toe for the presence of ticks.  Shower and shampoo. You can greatly reduce your chances of contracting RMSF if you remove attached ticks as soon as possible. Regular checks of the body, including all body sites covered by hair (head, armpits, genitals), allow removal of the tick before rickettsial transmission. To remove an attached tick, use a forceps or tweezers to detach the intact tick without leaving mouth parts in the skin. The tick bite wound should be cleansed after tick removal. Remember the most common symptoms of RMSF are fever, muscle aches, headache and nausea or vomiting with a later onset of rash. If you get these symptoms after a tick bite and while living in an area where RMSF is found, RMSF should be suspected. If the disease is not treated, it can cause death. See your caregiver immediately if you get these symptoms. Do this even if not aware of a tick bite. Document Released: 05/18/2000 Document Revised: 04/28/2011 Document Reviewed: 01/08/2009 [facility] Patient Information 2015 [place]Care, [place]. This information is not intended to replace advice given to you by your health care provider. Make sure you discuss any questions you have with your health care provider.

## 2013-09-06 ENCOUNTER — Encounter: Payer: Self-pay | Admitting: Family Medicine

## 2013-09-06 LAB — BASIC METABOLIC PANEL
BUN: 15 mg/dL (ref 6–23)
CHLORIDE: 105 meq/L (ref 96–112)
CO2: 27 meq/L (ref 19–32)
CREATININE: 1.17 mg/dL (ref 0.50–1.35)
Calcium: 9.1 mg/dL (ref 8.4–10.5)
Glucose, Bld: 98 mg/dL (ref 70–99)
POTASSIUM: 4 meq/L (ref 3.5–5.3)
Sodium: 139 mEq/L (ref 135–145)

## 2013-10-07 ENCOUNTER — Encounter: Payer: Self-pay | Admitting: Family Medicine

## 2013-10-07 ENCOUNTER — Ambulatory Visit (INDEPENDENT_AMBULATORY_CARE_PROVIDER_SITE_OTHER): Payer: Commercial Managed Care - PPO | Admitting: Family Medicine

## 2013-10-07 VITALS — BP 109/75 | HR 76 | Temp 98.5°F | Ht 72.0 in | Wt 242.9 lb

## 2013-10-07 DIAGNOSIS — R51 Headache: Secondary | ICD-10-CM

## 2013-10-07 DIAGNOSIS — M25569 Pain in unspecified knee: Secondary | ICD-10-CM

## 2013-10-07 DIAGNOSIS — M25561 Pain in right knee: Secondary | ICD-10-CM

## 2013-10-07 DIAGNOSIS — R42 Dizziness and giddiness: Secondary | ICD-10-CM

## 2013-10-07 DIAGNOSIS — M25562 Pain in left knee: Secondary | ICD-10-CM | POA: Insufficient documentation

## 2013-10-07 DIAGNOSIS — R519 Headache, unspecified: Secondary | ICD-10-CM | POA: Insufficient documentation

## 2013-10-07 LAB — CBC
HCT: 42 % (ref 39.0–52.0)
HEMOGLOBIN: 14.5 g/dL (ref 13.0–17.0)
MCH: 29.2 pg (ref 26.0–34.0)
MCHC: 34.5 g/dL (ref 30.0–36.0)
MCV: 84.7 fL (ref 78.0–100.0)
PLATELETS: 231 10*3/uL (ref 150–400)
RBC: 4.96 MIL/uL (ref 4.22–5.81)
RDW: 13.9 % (ref 11.5–15.5)
WBC: 6.1 10*3/uL (ref 4.0–10.5)

## 2013-10-07 MED ORDER — ACETAMINOPHEN 500 MG PO TABS: 1000.0000 mg | ORAL_TABLET | Freq: Three times a day (TID) | ORAL | Status: DC | PRN

## 2013-10-07 MED ORDER — CYCLOBENZAPRINE HCL 5 MG PO TABS: 5.0000 mg | ORAL_TABLET | Freq: Three times a day (TID) | ORAL | Status: DC | PRN

## 2013-10-07 NOTE — Assessment & Plan Note (Signed)
Headaches appear to be experienced primarily in the occipital region of his head. They appeared to be musculoskeletal in nature. However it is also very likely that they are possibly due to hypovolemic changes his body experiences while at work. I strongly encouraged him to continue to keep his fluids up while at work. Also, with the possible musculoskeletal nature of his symptoms he is reporting--I have prescribed him a short course of Flexeril 5 mg. I took time to ensure that he understood the precautions with this medication especially while working with/around heavy machinery.

## 2013-10-07 NOTE — Progress Notes (Signed)
Patient ID: Tony Nguyen, male   DOB: 17-Apr-1960, 53 y.o.   MRN: [id number]  Mickie Hillier, MD, MS Phone: [phone]  Subjective:  Chief complaint  Pt Here for HEADACHE   Onset: 2 days  Location: occipital  Quality: 6-7/10 Frequency: daily Precipitating factors: work, leaning forward    Prior treatment: ibuprofen  Associated Symptoms Nausea/vomiting: no  Photophobia/phonophobia: no  Tearing of eyes: no  Sinus pain/pressure: no  Family hx migraine: no  Personal stressors: yes, work   [employer] Fever: no  Neck pain/stiffness: yes, leaning forward increased discomfort. 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 He brings 3 bottles of water with him to work each day. He states that he does his best to stay well-hydrated throughout his day. He reports heavy diaphoresis due to the heat of his workplace. When he does experience some dizziness he is able to sit down and it subsides over time. He denies any palpitations or chest pain.  Patient states his been experiencing some knee pain as well as his headache/dizziness. His knee pain has been associated with work. No trauma was noted. Patient states that he has associated with significant use throughout his day. Patient says he's taken some ibuprofen which is helped slightly for this pain. No swelling/erythema has been noted by the patient.  Review of Systems  Constitutional: Positive for diaphoresis. Negative for fever, chills and malaise/fatigue.  HENT: Negative for congestion and hearing loss.   Eyes: Positive for blurred vision.  Respiratory: Negative for cough and shortness of breath.   Cardiovascular: Negative for chest  pain and palpitations.  Gastrointestinal: Negative for nausea, vomiting and diarrhea.  Musculoskeletal: Positive for back pain, joint pain and neck pain. Negative for falls.  Neurological: Positive for dizziness and headaches.     Past Medical History Patient Active Problem List   Diagnosis Date Noted  . Headache(784.0) 10/07/2013  . Knee pain, bilateral 10/07/2013  . Vertigo 05/12/2013  . Itching 01/07/2013  . Leg cramps 11/18/2012  . 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 EPIGASTRIC PAIN, CHRONIC 02/23/2008  . OBESITY, NOS 04/16/2006  . 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 DEPRESSIVE DISORDER, NOS 04/16/2006    Medications- reviewed and updated Current Outpatient Prescriptions  Medication Sig Dispense Refill  . acetaminophen (TYLENOL) 500 MG tablet Take 2 tablets (1,000 mg total) by mouth every 8 (eight) hours as needed.  30 tablet  0  . cyclobenzaprine (FLEXERIL) 5 MG tablet Take 1 tablet (5 mg total) by mouth 3 (three) times daily as needed for muscle spasms.  30 tablet  0  . doxycycline (VIBRA-TABS) 100 MG tablet Take 1 tablet (100 mg total) by mouth 2 (two) times daily. Take for 14 days.  28 tablet  0  . ondansetron (ZOFRAN ODT) 8 MG disintegrating tablet Take 0.5-1 tablets (4-8 mg total) by mouth every 8 (eight) hours as needed for nausea or vomiting.  20 tablet  0   No current facility-administered medications for this visit.    Objective: BP 109/75  Pulse 76  Temp(Src) 98.5 F (36.9 C) (Oral)  Ht 6' (1.829 m)  Wt 242 lb 14.4 oz (110.179 kg)  BMI 32.94 kg/m2 Gen: NAD, alert, cooperative with exam HEENT: NCAT, EOMI,  PERRL CV: RRR, good S1/S2, no murmur Resp: CTABL, no wheezes, non-labored Abd: Soft, Non Tender, Non Distended, BS present, no guarding or organomegaly Ext: No edema, warm Neuro: Alert and oriented, No gross deficits   Assessment/Plan:  Headache(784.0) Headaches appear to be experienced primarily in the occipital region of his  head. They appeared to be musculoskeletal in nature. However it is also very likely that they are possibly due to hypovolemic changes his body experiences while at work. I strongly encouraged him to continue to keep his fluids up while at work. Also, with the possible musculoskeletal nature of his symptoms he is reporting--I have prescribed him a short course of Flexeril 5 mg. I took time to ensure that he understood the precautions with this medication especially while working with/around heavy machinery.  Vertigo Most likely due to hypovolemic changes. See headaches above.  Knee pain, bilateral Patient states his been experiencing bilateral knee pain for quite some time. He says that ibuprofen has helped slight with this pain. Do to his history of duodenal ulcers I strongly encouraged him to discontinue taking any ibuprofen from this day forward. I prescribed to him Tylenol 1000 mg to be taken twice a day or 3 times a day as needed. I also informed him of the possibility to attempts corticosteroid injections in both knees in the future if oral medication does not help. Consider radiographic imaging in the future.    Orders Placed This Encounter  Procedures  . CBC    Meds ordered this encounter  Medications  . cyclobenzaprine (FLEXERIL) 5 MG tablet    Sig: Take 1 tablet (5 mg total) by mouth 3 (three) times daily as needed for muscle spasms.    Dispense:  30 tablet    Refill:  0  . acetaminophen (TYLENOL) 500 MG tablet    Sig: Take 2 tablets (1,000 mg total) by mouth every 8 (eight) hours as needed.    Dispense:  30 tablet    Refill:  0

## 2013-10-07 NOTE — Assessment & Plan Note (Signed)
Patient states his been experiencing bilateral knee pain for quite some time. He says that ibuprofen has helped slight with this pain. Do to his history of duodenal ulcers I strongly encouraged him to discontinue taking any ibuprofen from this day forward. I prescribed to him Tylenol 1000 mg to be taken twice a day or 3 times a day as needed. I also informed him of the possibility to attempts corticosteroid injections in both knees in the future if oral medication does not help. Consider radiographic imaging in the future.

## 2013-10-07 NOTE — Patient Instructions (Signed)
It was a pleasure meeting you today. Here is the plan that we discussed and agreed upon:  - Due to your history of duodenal ulcers please discontinue taking ibuprofen and all other NSAIDs immediately. - For your knee pain these began taking Tylenol 1000 mg 2-3 times a day when necessary. - For the headaches and dizziness I recommend frequent breaks while at work and attempts to Loew[employer] Corporationpre-hydrate yourself for work; and drinking plenty of water while on the job. - I prescribed for you a short regimen of Flexeril. This should help for your back pain/headaches. Please understand that you must evaluate how your body tolerates this medication before working on or around any heavy machinery. This should be done while at home, and careful judgment should be taken before deciding to take before work. If there is any doubt on your ability to perform on or around heavy machinery safely then I strongly urge you to not take this medication while at work. - If your symptoms persist or worsen over the next week to 2 weeks you are urged to reschedule a followup appointment or report to the nearest emergency department.

## 2013-10-07 NOTE — Assessment & Plan Note (Signed)
Most likely due to hypovolemic changes. See headaches above.

## 2013-10-18 ENCOUNTER — Ambulatory Visit (INDEPENDENT_AMBULATORY_CARE_PROVIDER_SITE_OTHER): Payer: Commercial Managed Care - PPO | Admitting: Family Medicine

## 2013-10-18 ENCOUNTER — Encounter: Payer: Self-pay | Admitting: Family Medicine

## 2013-10-18 VITALS — BP 116/77 | HR 94 | Temp 98.2°F | Ht 72.0 in | Wt 247.0 lb

## 2013-10-18 DIAGNOSIS — R21 Rash and other nonspecific skin eruption: Secondary | ICD-10-CM

## 2013-10-18 MED ORDER — HYDROCORTISONE 0.5 % EX CREA: 1.0000 "application " | TOPICAL_CREAM | Freq: Two times a day (BID) | CUTANEOUS | Status: DC

## 2013-10-18 MED ORDER — HYDROXYZINE HCL 10 MG PO TABS: 10.0000 mg | ORAL_TABLET | Freq: Three times a day (TID) | ORAL | Status: DC | PRN

## 2013-10-18 NOTE — Patient Instructions (Signed)
Use a moisturizer to the affected area (brands like Eucerin). Apply the topical steroid once a day for the next several weeks. You can use the oral hydroxyzine to help with any worse itching as needed.

## 2013-10-18 NOTE — Progress Notes (Signed)
Patient ID: Tony Nguyen, male   DOB: 10-Oct-1960, 53 y.o.   MRN: [id number]   Subjective:    Patient ID: Tony Nguyen, male    DOB: 11-18-1960, 53 y.o.   MRN: [id number]  HPI  CC: rash on arms and legs  # Rash:  Present for past 1 week  Primarily itchy, but also a little painful  Occurred on forearm, upper arm, lower thigh.  No swelling, no redness, no drainage.  No changes in soaps, detergents.  Has had rashes in the past, but doesn't remember what they were  Did start taking flexeril last week for possible cervical headache; states the rash came after starting the flexeril  Denies any ruptured vesicles ROS: no fevers/chills, no CP, no joint pains, no muscle aches  Review of Systems   See HPI for ROS. All other systems reviewed and are negative. Objective:  BP 116/77  Pulse 94  Temp(Src) 98.2 F (36.8 C) (Oral)  Ht 6' (1.829 m)  Wt 247 lb (112.038 kg)  BMI 33.49 kg/m2 Vitals reviewed  General: NAD, pleasant AA male CV: 2+ radial pulses Skin: multiple circular healed scars approx 1mm grouped and located left and right forearms, upper arms. No burrows, no vesicles, mild if any erythema, no swelling.  Neuro: normal grip strength bilaterally   Assessment & Plan:  See Problem List Documentation

## 2013-10-18 NOTE — Assessment & Plan Note (Addendum)
No clear etiology, could be skin reaction to flexeril as the timing correlates however patient has had this medication in the past.  Plan: moisturizer cream, mild steroidal cream daily, hydroxyzine for itching as needed, f/u in 2-3 weeks if not improved or worsens. Consider trial of stopping flexeril.

## 2013-11-17 ENCOUNTER — Ambulatory Visit (INDEPENDENT_AMBULATORY_CARE_PROVIDER_SITE_OTHER): Payer: Commercial Managed Care - PPO | Admitting: Family Medicine

## 2013-11-17 ENCOUNTER — Encounter: Payer: Self-pay | Admitting: Family Medicine

## 2013-11-17 VITALS — BP 131/82 | HR 88 | Temp 98.2°F | Ht 72.0 in | Wt 243.4 lb

## 2013-11-17 DIAGNOSIS — R1013 Epigastric pain: Secondary | ICD-10-CM

## 2013-11-17 MED ORDER — OMEPRAZOLE 20 MG PO CPDR
20.0000 mg | DELAYED_RELEASE_CAPSULE | Freq: Every day | ORAL | Status: DC
Start: 2013-11-17 — End: 2015-05-02

## 2013-11-17 NOTE — Progress Notes (Signed)
   Subjective:    Patient ID: Tony Nguyen, male    DOB: 05/10/1960, 53 y.o.   MRN: [id number]  Patient presents for a same day appointment.  HPI  ABDOMINAL PAIN / GERD: - Chronic known hx of GERD with hx duodenal ulcer with positive H. Pylori, previously followed and worked-up by [facility] since 2011 (s/p EGD, treatment for H.pylori, PPI) - Reports symptoms started last night with epigastric pain before eating and "gurgling, bubbling in stomach", ate some rice, then took Prilosec went to bed, pain as sharp pain 10/10  - Currently improved down to 6/10 pain, pain improved by applying pressure to epigastric region otherwise constant pain with improvement since resuming PPI. Denies any association or worsening with food. - Last episode of similar pain in 04/2013 treated at that time with Prilosec with improvement - Admits to decreased appetite x 3 weeks - Requesting new prescription Prilosec - Admits epigastric abdominal pain - Denies current use of any NSAIDs or EtOH use - Denies any fevers/chills, chest pain or pressure, SOB, cough, nausea / vomiting, diarrhea, back pain  PMH: - Chronic cholecystitis, s/p lap choley 2012  I have reviewed and updated the following as appropriate: allergies and current medications  Social Hx: - Never smoker  Review of Systems  See above HPI    Objective:   Physical Exam  BP 131/82  Pulse 88  Temp(Src) 98.2 F (36.8 C) (Oral)  Ht 6' (1.829 m)  Wt 243 lb 6.4 oz (110.406 kg)  BMI 33.00 kg/m2  Gen - well-appearing, cooperative, NAD HEENT - MMM Heart - RRR, no murmurs heard Chest - non-tender to palpation Lungs - CTAB, no wheezing, crackles, or rhonchi. Normal work of breathing. Abd - soft, +mild-mod tenderness to epigastric region only seems slightly improved on palpation, otherwise abdomen non-tender, no rebound, negative McBurney's, no masses, +active BS Ext - peripheral pulses intact +2 b/l Skin - warm, dry, no rashes       Assessment & Plan:   See specific A&P problem list for details.

## 2013-11-17 NOTE — Patient Instructions (Signed)
Thank you for coming in to clinic today.  1. Sent prescription for Prilosec to pharmacy - recommend starting daily - take in morning. Avoid food triggers 2. Please call [facility] and schedule follow-up appointment - you may need another scope and they may treat you with antibiotics.  If pain worsens, develop fevers, nausea / vomiting, fevers, please call and follow-up sooner or go directly to Emergency Department.  Please schedule a follow-up appointment with Dr. Jaquita RectorMelancon in 1-3 months if symptoms not improving.  If you have any other questions or concerns, please feel free to call the clinic to contact me. You may also schedule an earlier appointment if necessary.  However, if your symptoms get significantly worse, please go to the Emergency Department to seek immediate medical attention.  Saralyn PilarAlexander Karamalegos, DO [facility]   Food Choices for Gastroesophageal Reflux Disease When you have gastroesophageal reflux disease (GERD), the foods you eat and your eating habits are very important. Choosing the right foods can help ease the discomfort of GERD. WHAT GENERAL GUIDELINES DO I NEED TO FOLLOW?  Choose fruits, vegetables, whole grains, low-fat dairy products, and low-fat meat, fish, and poultry.  Limit fats such as oils, salad dressings, butter, nuts, and avocado.  Keep a food diary to identify foods that cause symptoms.  Avoid foods that cause reflux. These may be different for different people.  Eat frequent small meals instead of three large meals each day.  Eat your meals slowly, in a relaxed setting.  Limit fried foods.  Cook foods using methods other than frying.  Avoid drinking alcohol.  Avoid drinking large amounts of liquids with your meals.  Avoid bending over or lying down until 2-3 hours after eating. WHAT FOODS ARE NOT RECOMMENDED? The following are some foods and drinks that may worsen your symptoms: Vegetables Tomatoes. Tomato juice. Tomato  and spaghetti sauce. Chili peppers. Onion and garlic. Horseradish. Fruits Oranges, grapefruit, and lemon (fruit and juice). Meats High-fat meats, fish, and poultry. This includes hot dogs, ribs, ham, sausage, salami, and bacon. Dairy Whole milk and chocolate milk. Sour cream. Cream. Butter. Ice cream. Cream cheese.  Beverages Coffee and tea, with or without caffeine. Carbonated beverages or energy drinks. Condiments Hot sauce. Barbecue sauce.  Sweets/Desserts Chocolate and cocoa. Donuts. Peppermint and spearmint. Fats and Oils High-fat foods, including [place] fries and potato chips. Other Vinegar. Strong spices, such as black pepper, white pepper, red pepper, cayenne, curry powder, cloves, ginger, and chili powder. The items listed above may not be a complete list of foods and beverages to avoid. Contact your dietitian for more information. Document Released: 02/03/2005 Document Revised: 02/08/2013 Document Reviewed: 12/08/2012 [facility] Patient Information 2015 [place], [place]LLC. This information is not intended to replace advice given to you by your health care provider. Make sure you discuss any questions you have with your health care provider.

## 2013-11-18 NOTE — Assessment & Plan Note (Signed)
Consistent with chronic known epigastric pain due to GERD, concern with hx prior duodenal ulcer (positive, H. Pylori) - Afebrile, benign abdomen, currently improving since resuming PPI, no chest pain/pressure/SOB - No GI red flag symptoms (no vomiting, hematemesis, rectal bleeding or melena) - Reviewed prior EGD 2011, confirmed H pylori, s/p treatment previously per GI, has not followed up with GI for several years  Plan: 1. Refilled Omeprazole 20mg  daily, recommend starting at 40mg  daily x 2 weeks then resume 20mg  daily 2. Advised to call and schedule f/u with [facility] for re-evaluation of PUD, may consider repeat EGD vs H pylori testing, however did not perform urea breath test today due to current PPI use 3. If symptoms not responding to PPI, consider empiric triple therapy for H pylori, RTC 1 month

## 2015-05-01 ENCOUNTER — Encounter (HOSPITAL_COMMUNITY): Payer: Self-pay

## 2015-05-01 ENCOUNTER — Emergency Department (HOSPITAL_COMMUNITY): Payer: Self-pay

## 2015-05-01 ENCOUNTER — Emergency Department (HOSPITAL_COMMUNITY)
Admission: EM | Admit: 2015-05-01 | Discharge: 2015-05-02 | Disposition: A | Discharge status: Home / Self Care | Payer: Self-pay | Attending: Emergency Medicine | Admitting: Emergency Medicine

## 2015-05-01 DIAGNOSIS — Z8709 Personal history of other diseases of the respiratory system: Secondary | ICD-10-CM | POA: Insufficient documentation

## 2015-05-01 DIAGNOSIS — Z8739 Personal history of other diseases of the musculoskeletal system and connective tissue: Secondary | ICD-10-CM | POA: Insufficient documentation

## 2015-05-01 DIAGNOSIS — Z8719 Personal history of other diseases of the digestive system: Secondary | ICD-10-CM | POA: Insufficient documentation

## 2015-05-01 DIAGNOSIS — Z9049 Acquired absence of other specified parts of digestive tract: Secondary | ICD-10-CM | POA: Insufficient documentation

## 2015-05-01 DIAGNOSIS — R1012 Left upper quadrant pain: Secondary | ICD-10-CM | POA: Insufficient documentation

## 2015-05-01 DIAGNOSIS — R1011 Right upper quadrant pain: Secondary | ICD-10-CM | POA: Insufficient documentation

## 2015-05-01 DIAGNOSIS — R1013 Epigastric pain: Secondary | ICD-10-CM | POA: Insufficient documentation

## 2015-05-01 DIAGNOSIS — Z8619 Personal history of other infectious and parasitic diseases: Secondary | ICD-10-CM | POA: Insufficient documentation

## 2015-05-01 LAB — CBC WITH DIFFERENTIAL/PLATELET
Basophils Absolute: 0 10*3/uL (ref 0.0–0.1)
Basophils Relative: 1 %
EOS PCT: 5 %
Eosinophils Absolute: 0.3 10*3/uL (ref 0.0–0.7)
HEMATOCRIT: 44.9 % (ref 39.0–52.0)
HEMOGLOBIN: 14.8 g/dL (ref 13.0–17.0)
LYMPHS ABS: 2.9 10*3/uL (ref 0.7–4.0)
Lymphocytes Relative: 52 %
MCH: 29.7 pg (ref 26.0–34.0)
MCHC: 33 g/dL (ref 30.0–36.0)
MCV: 90 fL (ref 78.0–100.0)
Monocytes Absolute: 0.4 10*3/uL (ref 0.1–1.0)
Monocytes Relative: 8 %
NEUTROS ABS: 1.9 10*3/uL (ref 1.7–7.7)
NEUTROS PCT: 34 %
PLATELETS: 230 10*3/uL (ref 150–400)
RBC: 4.99 MIL/uL (ref 4.22–5.81)
RDW: 12.9 % (ref 11.5–15.5)
WBC: 5.5 10*3/uL (ref 4.0–10.5)

## 2015-05-01 LAB — COMPREHENSIVE METABOLIC PANEL
ALBUMIN: 4.2 g/dL (ref 3.5–5.0)
ALT: 27 U/L (ref 17–63)
ANION GAP: 10 (ref 5–15)
AST: 27 U/L (ref 15–41)
Alkaline Phosphatase: 59 U/L (ref 38–126)
BUN: 17 mg/dL (ref 6–20)
CHLORIDE: 105 mmol/L (ref 101–111)
CO2: 26 mmol/L (ref 22–32)
Calcium: 9.6 mg/dL (ref 8.9–10.3)
Creatinine, Ser: 1.04 mg/dL (ref 0.61–1.24)
GFR calc non Af Amer: >60 mL/min (ref >60)
Glucose, Bld: 99 mg/dL (ref 65–99)
Potassium: 4.3 mmol/L (ref 3.5–5.1)
SODIUM: 141 mmol/L (ref 135–145)
Total Bilirubin: 0.6 mg/dL (ref 0.3–1.2)
Total Protein: 8 g/dL (ref 6.5–8.1)

## 2015-05-01 LAB — LIPASE, BLOOD: LIPASE: 31 U/L (ref 11–51)

## 2015-05-01 MED ORDER — HYDROMORPHONE HCL 1 MG/ML IJ SOLN
0.5000 mg | Freq: Once | INTRAMUSCULAR | Status: AC
  Administered 2015-05-01: 0.5 mg via INTRAVENOUS
  Filled 2015-05-01: qty 1

## 2015-05-01 MED ORDER — ONDANSETRON HCL 4 MG/2ML IJ SOLN
4.0000 mg | Freq: Once | INTRAMUSCULAR | Status: AC
  Administered 2015-05-01: 4 mg via INTRAVENOUS
  Filled 2015-05-01: qty 2

## 2015-05-01 NOTE — ED Notes (Signed)
Pt was requesting food but informed patient he needed to wait for all test results.

## 2015-05-01 NOTE — ED Provider Notes (Signed)
CSN: [id number]     Arrival date & time 05/01/15  2034 History   First MD Initiated Contact with Patient 05/01/15 2127     Chief Complaint  Patient presents with  . Abdominal Pain    EPIGASTRIC PAIN X2 DAYS     (Consider location/radiation/quality/duration/timing/severity/associated sxs/prior Treatment) HPI Comments: Patient presents with complaint of epigastric pain starting approximately 3 days ago. Patient has a history of cholecystectomy and appendectomy. Family states that he was on medication for reflux but is not currently. Pain waxes and wanes. It does not radiate. No associated fevers, nausea, vomiting, diarrhea, or urinary symptoms. Food does not make the pain worse. Patient denies heavy NSAID use or alcohol use. Patient states that he had similar pain in the past. He had a negative EGD. Onset of symptoms acute. Nothing makes symptoms worse. Deep palpation of the area makes the pain better.  Patient is a 55 y.o. male presenting with abdominal pain. The history is provided by the patient and a relative.  Abdominal Pain Associated symptoms: no chest pain, no cough, no diarrhea, no dysuria, no fever, no nausea, no shortness of breath, no sore throat and no vomiting     Past Medical History  Diagnosis Date  . Gallbladder polyp 09/25/2010  . TENNIS ELBOW 04/16/2006    Qualifier: Diagnosis of  By: Bebe Shaggy    . RHINITIS, CHRONIC 04/16/2006    Qualifier: Diagnosis of  By: Bebe Shaggy    . OTHER DISEASES OF NASAL CAVITY AND SINUSES 02/23/2008    Qualifier: Diagnosis of  By: Daphine Deutscher MD, Corrie Dandy    . Duodenal ulcer due to Helicobacter pylori 09/25/2010    EGD confirmed that duodenal ulcers and H. pylori gastritis in 08/2009. Treated with triple  therapy at [facility].    Past Surgical History  Procedure Laterality Date  . Elbow surgery  2005    right  . Appendectomy  1981  . Upper gastrointestinal endoscopy    . Cholecystectomy  10/15/10    Path results revealed chronic  cholecystitis   Family History  Problem Relation Age of Onset  . Diabetes Neg Hx   . Early death Neg Hx   . Heart disease Neg Hx   . Hyperlipidemia Neg Hx   . Hypertension Neg Hx    Social History  Substance Use Topics  . Smoking status: Never Smoker   . Smokeless tobacco: Never Used  . Alcohol Use: No    Review of Systems  Constitutional: Negative for fever.  HENT: Negative for rhinorrhea and sore throat.   Eyes: Negative for redness.  Respiratory: Negative for cough and shortness of breath.   Cardiovascular: Negative for chest pain.  Gastrointestinal: Positive for abdominal pain. Negative for nausea, vomiting and diarrhea.  Genitourinary: Negative for dysuria.  Musculoskeletal: Negative for myalgias.  Skin: Negative for rash.  Neurological: Negative for headaches.    Allergies  Review of patient's allergies indicates no known allergies.  Home Medications   Prior to Admission medications   Medication Sig Start Date End Date Taking? Authorizing Provider  acetaminophen (TYLENOL) 500 MG tablet Take 2 tablets (1,000 mg total) by mouth every 8 (eight) hours as needed. Patient not taking: Reported on 05/01/2015 10/07/13   Kathee Delton, MD  cyclobenzaprine (FLEXERIL) 5 MG tablet Take 1 tablet (5 mg total) by mouth 3 (three) times daily as needed for muscle spasms. Patient not taking: Reported on 05/01/2015 10/07/13   Kathee Delton, MD  hydrocortisone cream 0.5 % Apply 1  application topically 2 (two) times daily. Patient not taking: Reported on 05/01/2015 10/18/13   Nani RavensAndrew M Wight, MD  hydrOXYzine (ATARAX/VISTARIL) 10 MG tablet Take 1 tablet (10 mg total) by mouth 3 (three) times daily as needed for itching. Patient not taking: Reported on 05/01/2015 10/18/13   Nani RavensAndrew M Wight, MD  omeprazole (PRILOSEC) 20 MG capsule Take 1 capsule (20 mg total) by mouth daily. Patient not taking: Reported on 05/01/2015 11/17/13   Netta NeatAlexander J Karamalegos, DO  ondansetron (ZOFRAN ODT) 8 MG disintegrating  tablet Take 0.5-1 tablets (4-8 mg total) by mouth every 8 (eight) hours as needed for nausea or vomiting. Patient not taking: Reported on 05/01/2015 09/05/13   Stephanie Couphristopher M Street, MD   BP 147/90 mmHg  Pulse 60  Temp(Src) 98 F (36.7 C) (Oral)  Resp 18  Ht 6' (1.829 m)  Wt 106.595 kg  BMI 31.86 kg/m2  SpO2 100%   Physical Exam  Constitutional: He appears well-developed and well-nourished.  HENT:  Head: Normocephalic and atraumatic.  Eyes: Conjunctivae are normal. Right eye exhibits no discharge. Left eye exhibits no discharge.  Neck: Normal range of motion. Neck supple.  Cardiovascular: Normal rate, regular rhythm and normal heart sounds.   Pulmonary/Chest: Effort normal and breath sounds normal.  Abdominal: Soft. Bowel sounds are normal. He exhibits no distension and no pulsatile midline mass. There is tenderness (moderate) in the right upper quadrant, epigastric area and left upper quadrant. There is no rebound and no guarding.  Neurological: He is alert.  Skin: Skin is warm and dry.  Psychiatric: He has a normal mood and affect.  Nursing note and vitals reviewed.   ED Course  Procedures (including critical care time) Labs Review Labs Reviewed  CBC WITH DIFFERENTIAL/PLATELET  COMPREHENSIVE METABOLIC PANEL  LIPASE, BLOOD  URINALYSIS, ROUTINE W REFLEX MICROSCOPIC (NOT AT [facility])    Imaging Review Dg Abd Acute W/chest  05/01/2015  CLINICAL DATA:  Epigastric pain for 4 days. EXAM: DG ABDOMEN ACUTE W/ 1V CHEST COMPARISON:  None available, examination interpretedduring PACS downtime. FINDINGS: Cardiac silhouette appears mildly enlarged, mediastinal silhouette is unremarkable. Lungs are clear, no pleural effusions. No pneumothorax. Soft tissue planes and included osseous structures are nonacute; mildly widened LEFT acromioclavicular joint may be projectional or represent old injury. Bowel gas pattern is nondilated and nonobstructive. Surgical clips in the included right abdomen  compatible with cholecystectomy. No intra-abdominal mass effect, pathologic calcifications or free air. Soft tissue planes and included osseous structures are non-suspicious. IMPRESSION: Mild cardiomegaly, no acute pulmonary process. Normal bowel gas pattern. Electronically Signed   By: Awilda Metroourtnay  Bloomer M.D.   On: 05/01/2015 22:17   I have personally reviewed and evaluated these images and lab results as part of my medical decision-making.   EKG Interpretation   Date/Time:  Tuesday May 01 2015 22:14:28 EDT Ventricular Rate:  61 PR Interval:  210 QRS Duration: 112 QT Interval:  404 QTC Calculation: 407 R Axis:   -22 Text Interpretation:  Sinus rhythm Prolonged PR interval Confirmed by  Denton LankSTEINL  MD, Caryn BeeKEVIN ([id number]) on 05/01/2015 10:55:14 PM      9:45 PM Patient seen and examined. Work-up initiated. Medications ordered.   Vital signs reviewed and are as follows: BP 147/90 mmHg  Pulse 60  Temp(Src) 98 F (36.7 C) (Oral)  Resp 18  Ht 6' (1.829 m)  Wt 106.595 kg  BMI 31.86 kg/m2  SpO2 100%  12:41 AM Patient updated on results. States that he is feeling a bit better. He is tolerating  juice.   States he is comfortable with d/c to home at this time. Will discharge with omeprazole, Carafate, Pepcid. Patient sees family practice. Encouraged follow-up in the next 3 days. He may need GI evaluation if symptoms persist.  The patient was urged to return to the Emergency Department immediately with worsening of current symptoms, worsening abdominal pain, persistent vomiting, blood noted in stools, fever, or any other concerns. The patient verbalized understanding.    MDM   Final diagnoses:  Epigastric abdominal pain   Patient presents with epigastric abdominal pain 3 days. No vomiting. Lab workup today is reassuring without any specific abnormality. X-ray shows bowel gas, no free air. Abdomen is soft on exam with epigastric tenderness. No aneurysm seen on ultrasound in 2012. Patient has  had similar pain in the past. Most likely upper GI related at this point. Will give trial of Carafate, Pepcid, omeprazole as patient did seem to have improvement with PPI in the past per family. No current PPI.  Encouraged close PCP follow-up for further evaluation.   Renne Crigler, PA-C 05/02/15 0045  Cathren Laine, MD 05/07/15 [id number]

## 2015-05-01 NOTE — ED Notes (Signed)
Bed: WA20 Expected date:  Expected time:  Means of arrival:  Comments: Epigastric pain

## 2015-05-01 NOTE — ED Notes (Signed)
Attempted an IV but was unsuccessful. EMS had attempted and attempted blood draw with no success. Placed an IV consult with blood draw.

## 2015-05-01 NOTE — ED Notes (Addendum)
PT RECEIVED VIA EMS C/O EPIGASTRIC PAIN X3 DAYS. DENIES N/V/D. PT HAS A HX OF CHOLECYSTECTOMY. PT STATES DEEP PALPATION HELPS THE PAIN.

## 2015-05-01 NOTE — ED Notes (Signed)
Pt urinated but family members emptied his urinal before acquiring this urine sample.

## 2015-05-01 NOTE — ED Notes (Signed)
IV team at bedside 

## 2015-05-02 MED ORDER — GI COCKTAIL ~~LOC~~
30.0000 mL | Freq: Once | ORAL | Status: AC
  Administered 2015-05-02: 30 mL via ORAL
  Filled 2015-05-02: qty 30

## 2015-05-02 MED ORDER — SUCRALFATE 1 G PO TABS: 1.0000 g | ORAL_TABLET | Freq: Three times a day (TID) | ORAL | Status: DC

## 2015-05-02 MED ORDER — FAMOTIDINE 20 MG PO TABS: 20.0000 mg | ORAL_TABLET | Freq: Two times a day (BID) | ORAL | Status: DC

## 2015-05-02 MED ORDER — OMEPRAZOLE 20 MG PO CPDR: DELAYED_RELEASE_CAPSULE | ORAL | Status: DC

## 2015-05-02 NOTE — Discharge Instructions (Signed)
Please read and follow all provided instructions.  Your diagnoses today include:  1. Epigastric abdominal pain    Tests performed today include:  Blood counts and electrolytes  Blood tests to check liver and kidney function  Blood tests to check pancreas function  Urine test to look for infection and pregnancy (in women)  Vital signs. See below for your results today.   Medications prescribed:   Take any prescribed medications only as directed.  Home care instructions:   Follow any educational materials contained in this packet.  Follow-up instructions: Please follow-up with your primary care provider in the next 2 days for further evaluation of your symptoms.    Return instructions:  SEEK IMMEDIATE MEDICAL ATTENTION IF:  The pain does not go away or becomes severe   A temperature above 101F develops   Repeated vomiting occurs (multiple episodes)   The pain becomes localized to portions of the abdomen. The right side could possibly be appendicitis. In an adult, the left lower portion of the abdomen could be colitis or diverticulitis.   Blood is being passed in stools or vomit (bright red or black tarry stools)   You develop chest pain, difficulty breathing, dizziness or fainting, or become confused, poorly responsive, or inconsolable (young children)  If you have any other emergent concerns regarding your health  Additional Information: Abdominal (belly) pain can be caused by many things. Your caregiver performed an examination and possibly ordered blood/urine tests and imaging (CT scan, x-rays, ultrasound). Many cases can be observed and treated at home after initial evaluation in the emergency department. Even though you are being discharged home, abdominal pain can be unpredictable. Therefore, you need a repeated exam if your pain does not resolve, returns, or worsens. Most patients with abdominal pain don't have to be admitted to the hospital or have surgery, but  serious problems like appendicitis and gallbladder attacks can start out as nonspecific pain. Many abdominal conditions cannot be diagnosed in one visit, so follow-up evaluations are very important.  Your vital signs today were: BP 127/83 mmHg   Pulse 70   Temp(Src) 98.2 F (36.8 C) (Oral)   Resp 17   Ht 6' (1.829 m)   Wt 106.595 kg   BMI 31.86 kg/m2   SpO2 97% If your blood pressure (bp) was elevated above 135/85 this visit, please have this repeated by your doctor within one month. --------------

## 2015-07-30 ENCOUNTER — Ambulatory Visit (INDEPENDENT_AMBULATORY_CARE_PROVIDER_SITE_OTHER): Payer: Commercial Managed Care - PPO | Admitting: Family Medicine

## 2015-07-30 ENCOUNTER — Encounter: Payer: Self-pay | Admitting: Family Medicine

## 2015-07-30 VITALS — BP 128/82 | HR 77 | Temp 98.3°F | Wt 255.0 lb

## 2015-07-30 DIAGNOSIS — B354 Tinea corporis: Secondary | ICD-10-CM

## 2015-07-30 MED ORDER — TERBINAFINE HCL 1 % EX CREA: 1.0000 "application " | TOPICAL_CREAM | Freq: Two times a day (BID) | CUTANEOUS | Status: DC

## 2015-07-30 NOTE — Patient Instructions (Signed)
Follow up in 4 weeks if rash has not resolved.  Body Ringworm Ringworm (tinea corporis) is a fungal infection of the skin on the body. This infection is not caused by worms, but is actually caused by a fungus. Fungus normally lives on the top of your skin and can be useful. However, in the case of ringworms, the fungus grows out of control and causes a skin infection. It can involve any area of skin on the body and can spread easily from one person to another (contagious). Ringworm is a common problem for children, but it can affect adults as well. Ringworm is also often found in athletes, especially wrestlers who share equipment and mats.  CAUSES  Ringworm of the body is caused by a fungus called dermatophyte. It can spread by:  Touchingother people who are infected.  Touchinginfected pets.  Touching or sharingobjects that have been in contact with the infected person or pet (hats, combs, towels, clothing, sports equipment). SYMPTOMS   Itchy, raised red spots and bumps on the skin.  Ring-shaped rash.  Redness near the border of the rash with a clear center.  Dry and scaly skin on or around the rash. Not every person develops a ring-shaped rash. Some develop only the red, scaly patches. DIAGNOSIS  Most often, ringworm can be diagnosed by performing a skin exam. Your caregiver may choose to take a skin scraping from the affected area. The sample will be examined under the microscope to see if the fungus is present.  TREATMENT  Body ringworm may be treated with a topical antifungal cream or ointment. Sometimes, an antifungal shampoo that can be used on your body is prescribed. You may be prescribed antifungal medicines to take by mouth if your ringworm is severe, keeps coming back, or lasts a long time.  HOME CARE INSTRUCTIONS   Only take over-the-counter or prescription medicines as directed by your caregiver.  Wash the infected area and dry it completely before applying yourcream or  ointment.  When using antifungal shampoo to treat the ringworm, leave the shampoo on the body for 3-5 minutes before rinsing.   Wear loose clothing to stop clothes from rubbing and irritating the rash.  Wash or change your bed sheets every night while you have the rash.  Have your pet treated by your veterinarian if it has the same infection. To prevent ringworm:   Practice good hygiene.  Wear sandals or shoes in public places and showers.  Do not share personal items with others.  Avoid touching red patches of skin on other people.  Avoid touching pets that have bald spots or wash your hands after doing so. SEEK MEDICAL CARE IF:   Your rash continues to spread after 7 days of treatment.  Your rash is not gone in 4 weeks.  The area around your rash becomes red, warm, tender, and swollen.   This information is not intended to replace advice given to you by your health care provider. Make sure you discuss any questions you have with your health care provider.   Document Released: 02/01/2000 Document Revised: 10/29/2011 Document Reviewed: 08/18/2011 Elsevier Interactive Patient Education [employer] Elsevier Inc.

## 2015-07-30 NOTE — Progress Notes (Signed)
   Subjective: CC: rash [id number]:Tony Nguyen is a 55 y.o. male presenting to clinic today for same day appointment. PCP: Velora HecklerHaney,Alyssa, MD Concerns today include:  1. Rash Patient notes that rash started about 2 months ago.  Started on the inside of right knee and has spread to neck and behind ears and jaws.  It is itchy.  Denies new detergents, lotions, soaps.  Possible new foods.  No preceding illness or new medications.  No nausea, vomiting, SOB.  Has been using a topical ointment from [place].  He notes that it relieves the itching but has not resolved the rash.  Notes that rash seems to be worse in hot weather.  MedHx reviewed.  Please see EMR.  ROS: Per HPI  Objective: Office vital signs reviewed. BP 128/82 mmHg  Pulse 77  Temp(Src) 98.3 F (36.8 C) (Oral)  Wt 255 lb (115.667 kg)  SpO2 98%  Physical Examination:  General: Awake, alert, well nourished, No acute distress HEENT: Normal, no LAD Skin: dry, intact, circular maculopapular rash with central clearing on inside of right knee, behind left ear, at border of right jawline, and at suprasternal notch ranging from 0.5 inches to 2 inches in diameter.  Non exudative, non bleeding.  Assessment/ Plan: 55 y.o. male   1. Tinea corporis.  Exam seems consistent with this.   - terbinafine (LAMISIL AT) 1 % cream; Apply 1 application topically 2 (two) times daily. x2-4 weeks  Dispense: 30 g; Refill: 1 - Could consider skin scraping at next visit if no resolution - If no improvement in 4 weeks, would consider oral therapy - Discussed frequent hand washing, disposal of current razor blade - Return precautions reviewed - Follow up prn  Raliegh IpAshly M Oree Mirelez, DO PGY-2, [facility]

## 2016-04-16 ENCOUNTER — Encounter: Payer: Self-pay | Admitting: Student

## 2016-04-16 ENCOUNTER — Ambulatory Visit (INDEPENDENT_AMBULATORY_CARE_PROVIDER_SITE_OTHER): Payer: No Typology Code available for payment source | Admitting: Student

## 2016-04-16 VITALS — BP 130/88 | HR 93 | Temp 98.4°F | Ht 72.0 in | Wt 262.0 lb

## 2016-04-16 DIAGNOSIS — Z131 Encounter for screening for diabetes mellitus: Secondary | ICD-10-CM | POA: Diagnosis not present

## 2016-04-16 DIAGNOSIS — R1013 Epigastric pain: Secondary | ICD-10-CM | POA: Diagnosis not present

## 2016-04-16 DIAGNOSIS — Z1159 Encounter for screening for other viral diseases: Secondary | ICD-10-CM | POA: Diagnosis not present

## 2016-04-16 DIAGNOSIS — E669 Obesity, unspecified: Secondary | ICD-10-CM

## 2016-04-16 DIAGNOSIS — Z23 Encounter for immunization: Secondary | ICD-10-CM

## 2016-04-16 DIAGNOSIS — Z0000 Encounter for general adult medical examination without abnormal findings: Secondary | ICD-10-CM | POA: Diagnosis not present

## 2016-04-16 DIAGNOSIS — Z114 Encounter for screening for human immunodeficiency virus [HIV]: Secondary | ICD-10-CM | POA: Diagnosis not present

## 2016-04-16 LAB — POCT GLYCOSYLATED HEMOGLOBIN (HGB A1C): HEMOGLOBIN A1C: 6

## 2016-04-16 MED ORDER — SUCRALFATE 1 G PO TABS: 1.0000 g | ORAL_TABLET | Freq: Three times a day (TID) | ORAL | 1 refills | Status: DC

## 2016-04-16 MED ORDER — OMEPRAZOLE 20 MG PO CPDR: DELAYED_RELEASE_CAPSULE | ORAL | 1 refills | Status: DC

## 2016-04-16 NOTE — Progress Notes (Signed)
   Subjective:    Patient ID: Tony Nguyen, male    DOB: 10/11/1960, 56 y.o.   MRN: [id number]   CC: follow up  HPI: 56 y/o M presents for follow up. He reports no issues but requests medication refill  Chronic epigastric pain - has been treated with prilosec, pepcid and carafate in past buit has run out - he continues to have pain occasionally but does not know what makes it worse - no know associated with food - no melena or hematemesis - no current pain  Obesity - he denies trying to eat healthfully and reports his exercise is his work at a [employer]  Healthcare maintenance - due for Tdap, HIV and Hep C screening  Smoking status reviewed  Review of Systems  Per HPI, else denies recent illness, fever, chest pain, shortness of breath, abdominal pain,    Objective:  BP 130/88   Pulse 93   Temp 98.4 F (36.9 C) (Oral)   Ht 6' (1.829 m)   Wt 262 lb (118.8 kg)   SpO2 96%   BMI 35.53 kg/m  Vitals and nursing note reviewed  General: NAD Cardiac: RRR,  Respiratory: CTAB, normal effort Abdomen: obese, soft, nontender, nondistended, no Bowel sounds present Extremities: no edema or cyanosis. WWP. Skin: warm and dry, no rashes noted Neuro: alert and oriented, no focal deficits   Assessment & Plan:    EPIGASTRIC PAIN, CHRONIC History consistent with GERD, no red flags, no melena, hematemesis or bright red blood per rectum - will refill prilosec and carafate - will add pepcid back if needed  OBESITY, NOS Body mass index is 35.53 kg/m. Lifestyle modification discussed including healthy diet and exercise - will collect lipid panel - A1c as last A1c in [id number] was elevated at 5.9 - BMP  Health care maintenance Tdap, HIV. Hep C today    Avyonna Wagoner A. Kennon RoundsHaney MD, MS Family Medicine Resident PGY-3 Pager [phone]

## 2016-04-16 NOTE — Assessment & Plan Note (Signed)
History consistent with GERD, no red flags, no melena, hematemesis or bright red blood per rectum - will refill prilosec and carafate - will add pepcid back if needed

## 2016-04-16 NOTE — Assessment & Plan Note (Signed)
Tdap, HIV. Hep C today

## 2016-04-16 NOTE — Assessment & Plan Note (Signed)
Body mass index is 35.53 kg/m. Lifestyle modification discussed including healthy diet and exercise - will collect lipid panel - A1c as last A1c in [id number] was elevated at 5.9 - BMP

## 2016-04-16 NOTE — Patient Instructions (Signed)
Follow up as needed Your medications will be refilled If your labs are abnormal, you will be called If you have any questions or concerns, call the office at [phone]

## 2016-04-17 ENCOUNTER — Telehealth: Payer: Self-pay | Admitting: Student

## 2016-04-17 ENCOUNTER — Encounter: Payer: Self-pay | Admitting: Student

## 2016-04-17 DIAGNOSIS — R7303 Prediabetes: Secondary | ICD-10-CM | POA: Insufficient documentation

## 2016-04-17 LAB — BASIC METABOLIC PANEL WITH GFR
BUN: 15 mg/dL (ref 7–25)
CHLORIDE: 104 mmol/L (ref 98–110)
CO2: 22 mmol/L (ref 20–31)
Calcium: 9.4 mg/dL (ref 8.6–10.3)
Creat: 1.19 mg/dL (ref 0.70–1.33)
GFR, EST AFRICAN AMERICAN: 80 mL/min (ref >=60)
GFR, Est Non African American: 69 mL/min (ref >=60)
Glucose, Bld: 93 mg/dL (ref 65–99)
POTASSIUM: 4.3 mmol/L (ref 3.5–5.3)
SODIUM: 139 mmol/L (ref 135–146)

## 2016-04-17 LAB — LIPID PANEL
Cholesterol: 187 mg/dL (ref <200)
HDL: 45 mg/dL (ref >40)
LDL CALC: 121 mg/dL — AB (ref <100)
Total CHOL/HDL Ratio: 4.2 Ratio (ref <5.0)
Triglycerides: 104 mg/dL (ref <150)
VLDL: 21 mg/dL (ref <30)

## 2016-04-17 LAB — HIV ANTIBODY (ROUTINE TESTING W REFLEX): HIV: NONREACTIVE

## 2016-04-17 LAB — HEPATITIS C ANTIBODY: HCV Ab: NEGATIVE

## 2016-04-17 MED ORDER — ASPIRIN EC 81 MG PO TBEC: 81.0000 mg | DELAYED_RELEASE_TABLET | Freq: Every day | ORAL | 2 refills | Status: AC

## 2016-04-17 NOTE — Telephone Encounter (Signed)
Patient scheduled an appointment for 04-23-16. Tony Nguyen,CMA

## 2016-04-17 NOTE — Telephone Encounter (Signed)
Please call the patient and ask him to make an appointment to discuss his labs. Specifically, his A1c is in the prediabetic range

## 2016-04-23 ENCOUNTER — Ambulatory Visit (INDEPENDENT_AMBULATORY_CARE_PROVIDER_SITE_OTHER): Payer: No Typology Code available for payment source | Admitting: Student

## 2016-04-23 ENCOUNTER — Encounter: Payer: Self-pay | Admitting: Student

## 2016-04-23 VITALS — BP 120/80 | HR 98 | Temp 98.1°F | Wt 258.0 lb

## 2016-04-23 DIAGNOSIS — R7303 Prediabetes: Secondary | ICD-10-CM

## 2016-04-23 NOTE — Patient Instructions (Signed)
Diabetes Insipidus Diabetes insipidus (DI) is a rare condition that causes the body to produce more urine than normal, which leads to thirst and dehydration. The urine is made mostly of water (dilute urine). There are four types of DI:  Central DI. This is the most common type.  Dipsogenic DI.  Nephrogenic DI.  Pregnancy-related DI. The most common forms are related to decreased production of the hormone that regulates urine production (antidiuretic hormone) or resistance to this hormone. DI can be managed with treatment and is not usually serious. This condition is not related to type 1 or type 2 diabetes mellitus, in which blood sugar (glucose) levels are too high. DI affects mostly adults, but it can happen at any age. What are the causes? Central DI is caused by damage to the pituitary gland or hypothalamus in the brain. Dipsogenic DI is caused by a defect in the thirst mechanism in the brain. This defect causes you to drink too much fluid. These may result from:  Brain surgery.  Infection.  Inflammation.  Brain tumor.  Head injury. Nephrogenic DI is caused by the kidneys not responding to the antidiuretic hormone in the body. This may result from:  Chronic kidney disease (CKD).  Certain medicines, such as lithium.  Low potassium levels.  High calcium levels. Pregnancy-related DI is caused by the antidiuretic hormone not working properly in the body. This results from having a temporary form of diabetes mellitus that develops during pregnancy (gestational diabetes mellitus). What are the signs or symptoms? Symptoms of this condition include:  Excessive urination. This means urinating more than 10 cups (2.4 L) during a period of 24 hours.  Excessive thirst.  Frequent nighttime urination (nocturia).  Nausea.  Diarrhea. How is this diagnosed? This condition may be diagnosed based on:  Your medical history.  A physical exam.  Blood tests.  Urine tests. How is  this treated? Once your specific type of diabetes insipidus is diagnosed, treatment may include one or more of the following:  Increasing or limiting your fluid intake.  Taking medicines that contain artificial (synthetic) versions of the antidiuretic hormone.  Stopping certain medicines that you take.  Correcting the balance of minerals (electrolytes) in your body.  Changing your diet. You may be put on a low-protein or low-sodium diet. You may need to visit your health care provider regularly to make sure your condition is being treated properly. You may also need to work with providers who specialize in:  Kidney problems (nephrologist).  Hormone disorders (endocrinologist). Follow these instructions at home:  Follow instructions from your health care provider about how much fluid and water to drink. You may be directed to drink more fluids and water, or to limit how much fluid and water you drink.  Follow instructions from your health care provider about eating or drinking restrictions.  Take over-the-counter and prescription medicines only as told by your health care provider.  Return to your normal activities as told by your health care provider. Ask your health care provider what activities are safe for you.  If directed, monitor your risk of dehydration in extreme heat.  Keep all follow-up visits as told by your health care provider. This is important.  Carry a medical alert card or wear medical alert jewelry. Contact a health care provider if:  You continue to have symptoms after treatment. Get help right away if:  You have extreme thirst.  You have symptoms of severe dehydration, such as rapid heart rate, muscle cramps, or confusion.  Summary  Diabetes insipidus (DI) is a rare condition that causes the body to produce more urine than normal, which leads to thirst and dehydration.  Follow instructions from your health care provider about eating or drinking  restrictions.  Treatment may include increasing or limiting your fluid intake and correcting the balance of minerals (electrolytes) in your body.  Get help right away if you have symptoms of severe dehydration, such as rapid heart rate, muscle cramps, or confusion. This information is not intended to replace advice given to you by your health care provider. Make sure you discuss any questions you have with your health care provider. Document Released: 02/08/2013 Document Revised: 11/13/2015 Document Reviewed: 11/05/2015 Elsevier Interactive Patient Education  2017 [employer].

## 2016-04-23 NOTE — Assessment & Plan Note (Signed)
A1c 6.0. Discussed lifestyl modification versus starting medication therapy. The patient decided to work on his lifestyle and if no improvement in hs A1c, start medication - referral to dr Gerilyn PilgrimSykes - discussed healthy exercise - will follow in 3 months and recheck A1c then, if not improved will start metformin - patient is agreeable with this plan

## 2016-04-23 NOTE — Progress Notes (Signed)
   Subjective:    Patient ID: Tony Nguyen, male    DOB: 02/24/1960, 56 y.o.   MRN: [id number]   CC: Follow up A1c  HPI: 56 y/o presents to discuss elevated A1c  Elevated A1c - A1c was 6.0 - he dies getting exercise and does not try to eat a healthy diet - lipid panel and CMP performed and were largely within normal limits  Smoking status reviewed  Review of Systems  Per HPI, else denies recent illness, fever, headache, changes in vision, chest pain,    Objective:  BP 120/80   Pulse 98   Temp 98.1 F (36.7 C) (Oral)   Wt 258 lb (117 kg)   SpO2 98%   BMI 34.99 kg/m  Vitals and nursing note reviewed  General: NAD Cardiac: RRR,  Respiratory: CTAB, normal effort Neuro: alert and oriented   Assessment & Plan:    Prediabetes A1c 6.0. Discussed lifestyl modification versus starting medication therapy. The patient decided to work on his lifestyle and if no improvement in hs A1c, start medication - referral to dr Gerilyn PilgrimSykes - discussed healthy exercise - will follow in 3 months and recheck A1c then, if not improved will start metformin - patient is agreeable with this plan    Tony Nguyen A. Kennon RoundsHaney MD, MS Family Medicine Resident PGY-3 Pager [phone]

## 2017-04-01 ENCOUNTER — Other Ambulatory Visit: Payer: Self-pay

## 2017-04-01 ENCOUNTER — Emergency Department (HOSPITAL_COMMUNITY)
Admission: EM | Admit: 2017-04-01 | Discharge: 2017-04-01 | Disposition: A | Discharge status: Home / Self Care | Payer: No Typology Code available for payment source | Attending: Emergency Medicine | Admitting: Emergency Medicine

## 2017-04-01 ENCOUNTER — Encounter (HOSPITAL_COMMUNITY): Payer: Self-pay | Admitting: Emergency Medicine

## 2017-04-01 DIAGNOSIS — R69 Illness, unspecified: Secondary | ICD-10-CM

## 2017-04-01 DIAGNOSIS — Z79899 Other long term (current) drug therapy: Secondary | ICD-10-CM | POA: Insufficient documentation

## 2017-04-01 DIAGNOSIS — Z7982 Long term (current) use of aspirin: Secondary | ICD-10-CM | POA: Diagnosis not present

## 2017-04-01 DIAGNOSIS — R51 Headache: Secondary | ICD-10-CM | POA: Diagnosis present

## 2017-04-01 DIAGNOSIS — J111 Influenza due to unidentified influenza virus with other respiratory manifestations: Secondary | ICD-10-CM | POA: Insufficient documentation

## 2017-04-01 LAB — URINALYSIS, ROUTINE W REFLEX MICROSCOPIC
BACTERIA UA: NONE SEEN
Bilirubin Urine: NEGATIVE
GLUCOSE, UA: NEGATIVE mg/dL
KETONES UR: NEGATIVE mg/dL
Leukocytes, UA: NEGATIVE
Nitrite: NEGATIVE
PROTEIN: NEGATIVE mg/dL
Specific Gravity, Urine: 1.027 (ref 1.005–1.030)
Squamous Epithelial / LPF: NONE SEEN
pH: 5 (ref 5.0–8.0)

## 2017-04-01 LAB — CBC WITH DIFFERENTIAL/PLATELET
Basophils Absolute: 0 10*3/uL (ref 0.0–0.1)
Basophils Relative: 0 %
Eosinophils Absolute: 0.2 10*3/uL (ref 0.0–0.7)
Eosinophils Relative: 4 %
HEMATOCRIT: 42.1 % (ref 39.0–52.0)
HEMOGLOBIN: 13.9 g/dL (ref 13.0–17.0)
LYMPHS ABS: 1.8 10*3/uL (ref 0.7–4.0)
LYMPHS PCT: 38 %
MCH: 29.4 pg (ref 26.0–34.0)
MCHC: 33 g/dL (ref 30.0–36.0)
MCV: 89.2 fL (ref 78.0–100.0)
Monocytes Absolute: 0.6 10*3/uL (ref 0.1–1.0)
Monocytes Relative: 12 %
NEUTROS ABS: 2.3 10*3/uL (ref 1.7–7.7)
NEUTROS PCT: 46 %
Platelets: 208 10*3/uL (ref 150–400)
RBC: 4.72 MIL/uL (ref 4.22–5.81)
RDW: 13.4 % (ref 11.5–15.5)
WBC: 4.9 10*3/uL (ref 4.0–10.5)

## 2017-04-01 LAB — BASIC METABOLIC PANEL
Anion gap: 10 (ref 5–15)
BUN: 10 mg/dL (ref 6–20)
CHLORIDE: 102 mmol/L (ref 101–111)
CO2: 24 mmol/L (ref 22–32)
CREATININE: 1.29 mg/dL — AB (ref 0.61–1.24)
Calcium: 9.1 mg/dL (ref 8.9–10.3)
GFR calc Af Amer: >60 mL/min (ref >60)
GFR calc non Af Amer: >60 mL/min (ref >60)
GLUCOSE: 113 mg/dL — AB (ref 65–99)
POTASSIUM: 4 mmol/L (ref 3.5–5.1)
SODIUM: 136 mmol/L (ref 135–145)

## 2017-04-01 MED ORDER — IBUPROFEN 800 MG PO TABS
800.0000 mg | ORAL_TABLET | Freq: Once | ORAL | Status: AC
  Administered 2017-04-01: 800 mg via ORAL
  Filled 2017-04-01: qty 1

## 2017-04-01 MED ORDER — IBUPROFEN 800 MG PO TABS: 800.0000 mg | ORAL_TABLET | Freq: Three times a day (TID) | ORAL | 0 refills | Status: DC

## 2017-04-01 MED ORDER — HYDROCODONE-HOMATROPINE 5-1.5 MG/5ML PO SYRP: ORAL_SOLUTION | ORAL | 0 refills | Status: DC

## 2017-04-01 NOTE — ED Provider Notes (Signed)
[facility] EMERGENCY DEPARTMENT Provider Note   CSN: [id number] Arrival date & time: 04/01/17  4098     History   Chief Complaint Chief Complaint  Patient presents with  . Influenza    HPI Abdul-Rashid I Vandenberghe is a 57 y.o. male.  HPI Patient symptoms started 3 days ago.  Reports aching all over his body.  Generalized headache.  Sore throat.  Cough.  No shortness of breath no chest pain.  No vomiting no diarrhea.  Patient denies any focal joint swelling or redness.  No body rashes.  No sick contacts that he is aware of.  Patient has not traveled and greater than 6 years.  He tried some TheraFlu yesterday with minimal improvement. Past Medical History:  Diagnosis Date  . Duodenal ulcer due to Helicobacter pylori 09/25/2010   EGD confirmed that duodenal ulcers and H. pylori gastritis in 08/2009. Treated with triple  therapy at [facility].   Marland Kitchen Gallbladder polyp 09/25/2010  . OTHER DISEASES OF NASAL CAVITY AND SINUSES 02/23/2008   Qualifier: Diagnosis of  By: Daphine Deutscher MD, Corrie Dandy    . RHINITIS, CHRONIC 04/16/2006   Qualifier: Diagnosis of  By: Bebe Shaggy    . TENNIS ELBOW 04/16/2006   Qualifier: Diagnosis of  By: Bebe Shaggy      Patient Active Problem List   Diagnosis Date Noted  . Prediabetes 04/17/2016  . Health care maintenance 04/16/2016  . Headache(784.0) 10/07/2013  . Knee pain, bilateral 10/07/2013  . Vertigo 05/12/2013  . Itching 01/07/2013  . Leg cramps 11/18/2012  . 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 EPIGASTRIC PAIN, CHRONIC 02/23/2008  . OBESITY, NOS 04/16/2006  . 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 DEPRESSIVE DISORDER, NOS 04/16/2006    Past Surgical History:  Procedure Laterality Date  . APPENDECTOMY  1981  . CHOLECYSTECTOMY  10/15/10   Path results revealed chronic cholecystitis  . ELBOW SURGERY  2005   right  . UPPER GASTROINTESTINAL ENDOSCOPY         Home Medications    Prior to Admission medications     Medication Sig Start Date End Date Taking? Authorizing Provider  aspirin EC 81 MG tablet Take 1 tablet (81 mg total) by mouth daily. 04/17/16 04/17/17  Bonney Aid, MD  famotidine (PEPCID) 20 MG tablet Take 1 tablet (20 mg total) by mouth 2 (two) times daily. Patient not taking: Reported on 04/16/2016 05/02/15   Renne Crigler, PA-C  HYDROcodone-homatropine [facility]) 5-1.5 MG/5ML syrup 5-10 mL every 4-6 hours as needed for severe cough. 04/01/17   Arby Barrette, MD  ibuprofen (ADVIL,MOTRIN) 800 MG tablet Take 1 tablet (800 mg total) by mouth 3 (three) times daily. 04/01/17   Arby Barrette, MD  omeprazole (PRILOSEC) 20 MG capsule Take one capsule PO twice a day for 3 days, then one capsule PO once a day 04/16/16   Haney, Arlyss Repress A, MD  sucralfate (CARAFATE) 1 g tablet Take 1 tablet (1 g total) by mouth 4 (four) times daily -  with meals and at bedtime. 04/16/16   Haney, Jeanann Lewandowsky, MD  sucralfate (CARAFATE) 1 g tablet Take 1 tablet (1 g total) by mouth 4 (four) times daily -  with meals and at bedtime. 04/16/16   Bonney Aid, MD    Family History Family History  Problem Relation Age of Onset  . Diabetes Neg Hx   . Early death Neg Hx   . Heart disease Neg Hx   . Hyperlipidemia Neg Hx   .  Hypertension Neg Hx     Social History Social History   Tobacco Use  . Smoking status: Never Smoker  . Smokeless tobacco: Never Used  Substance Use Topics  . Alcohol use: No  . Drug use: No     Allergies   Patient has no known allergies.   Review of Systems Review of Systems 10 Systems reviewed and are negative for acute change except as noted in the HPI.   Physical Exam Updated Vital Signs BP 110/66   Pulse 80   Temp 98.5 F (36.9 C) (Oral)   Resp 16   Ht 6' (1.829 m)   Wt 117 kg (258 lb)   SpO2 100%   BMI 34.99 kg/m   Physical Exam  Constitutional: He is oriented to person, place, and time. He appears well-developed and well-nourished. No distress.  HENT:  Head: Normocephalic  and atraumatic.  Bilateral TMs normal.  Oropharynx pink moist.  Posterior oropharynx widely patent without erythema or exudate.  Eyes: Conjunctivae and EOM are normal.  Neck: Neck supple. No thyromegaly present.  Cardiovascular: Normal rate, regular rhythm, normal heart sounds and intact distal pulses.  No murmur heard. Pulmonary/Chest: Effort normal and breath sounds normal. No respiratory distress.  Abdominal: Soft. He exhibits no distension. There is no tenderness.  Musculoskeletal: He exhibits no edema or tenderness.  Lymphadenopathy:    He has no cervical adenopathy.  Neurological: He is alert and oriented to person, place, and time. No cranial nerve deficit. He exhibits normal muscle tone. Coordination normal.  Skin: Skin is warm and dry.  Psychiatric: He has a normal mood and affect.  Nursing note and vitals reviewed.    ED Treatments / Results  Labs (all labs ordered are listed, but only abnormal results are displayed) Labs Reviewed  BASIC METABOLIC PANEL - Abnormal; Notable for the following components:      Result Value   Glucose, Bld 113 (*)    Creatinine, Ser 1.29 (*)    All other components within normal limits  URINALYSIS, ROUTINE W REFLEX MICROSCOPIC - Abnormal; Notable for the following components:   Hgb urine dipstick SMALL (*)    All other components within normal limits  CBC WITH DIFFERENTIAL/PLATELET    EKG  EKG Interpretation None       Radiology No results found.  Procedures Procedures (including critical care time)  Medications Ordered in ED Medications  ibuprofen (ADVIL,MOTRIN) tablet 800 mg (800 mg Oral Given 04/01/17 1022)     Initial Impression / Assessment and Plan / ED Course  I have reviewed the triage vital signs and the nursing notes.  Pertinent labs & imaging results that were available during my care of the patient were reviewed by me and considered in my medical decision making (see chart for details).     Final Clinical  Impressions(s) / ED Diagnoses   Final diagnoses:  Influenza-like illness   Patient had acute onset of febrile illness with generalized myalgia, headache, sore throat and cough.  He is clinically well in appearance.  Diagnostic studies negative.  Findings are most consistent with influenza without complications at this time.  Return precautions are reviewed.  Patient is given instructions on symptomatic treatment. ED Discharge Orders        Ordered    ibuprofen (ADVIL,MOTRIN) 800 MG tablet  3 times daily     04/01/17 1033    HYDROcodone-homatropine (HYCODAN) 5-1.5 MG/5ML syrup     04/01/17 1033       Arby BarrettePfeiffer, Arriel Victor, MD  04/01/17 1040  

## 2017-04-01 NOTE — ED Triage Notes (Signed)
Pt c/o 10/10 generalized body ache with fever, nausea and vomiting for the past few days.

## 2017-06-01 ENCOUNTER — Other Ambulatory Visit: Payer: Self-pay

## 2017-06-01 ENCOUNTER — Other Ambulatory Visit (HOSPITAL_COMMUNITY)
Admission: RE | Admit: 2017-06-01 | Discharge: 2017-06-01 | Disposition: A | Discharge status: Home / Self Care | Payer: No Typology Code available for payment source | Source: Ambulatory Visit | Attending: Family Medicine | Admitting: Family Medicine

## 2017-06-01 ENCOUNTER — Ambulatory Visit (INDEPENDENT_AMBULATORY_CARE_PROVIDER_SITE_OTHER): Payer: No Typology Code available for payment source | Admitting: Family Medicine

## 2017-06-01 ENCOUNTER — Encounter: Payer: Self-pay | Admitting: Family Medicine

## 2017-06-01 VITALS — BP 116/70 | HR 86 | Temp 98.1°F | Ht 72.0 in | Wt 259.0 lb

## 2017-06-01 DIAGNOSIS — B354 Tinea corporis: Secondary | ICD-10-CM | POA: Diagnosis not present

## 2017-06-01 DIAGNOSIS — E669 Obesity, unspecified: Secondary | ICD-10-CM

## 2017-06-01 DIAGNOSIS — Z202 Contact with and (suspected) exposure to infections with a predominantly sexual mode of transmission: Secondary | ICD-10-CM

## 2017-06-01 DIAGNOSIS — Z6835 Body mass index (BMI) 35.0-35.9, adult: Secondary | ICD-10-CM | POA: Diagnosis not present

## 2017-06-01 DIAGNOSIS — R7303 Prediabetes: Secondary | ICD-10-CM | POA: Diagnosis not present

## 2017-06-01 DIAGNOSIS — M25562 Pain in left knee: Secondary | ICD-10-CM

## 2017-06-01 HISTORY — DX: Tinea corporis: B35.4

## 2017-06-01 LAB — POCT GLYCOSYLATED HEMOGLOBIN (HGB A1C): HEMOGLOBIN A1C: 6.2

## 2017-06-01 MED ORDER — TERBINAFINE HCL 1 % EX CREA
1.0000 | TOPICAL_CREAM | Freq: Two times a day (BID) | CUTANEOUS | 0 refills | Status: DC
Start: 2017-06-01 — End: 2018-04-20

## 2017-06-01 MED ORDER — METFORMIN HCL 500 MG PO TABS
500.0000 mg | ORAL_TABLET | Freq: Every day | ORAL | 0 refills | Status: DC
Start: 2017-06-01 — End: 2017-07-03

## 2017-06-01 NOTE — Assessment & Plan Note (Signed)
Rash on upper back and L elbow with raised borders and central clearing c/w tinea corporis. Similar to tinea that patient had in the past that resolved with topical lamisil. Discussed treat with lamisil and RTC if spreads or does not improve.

## 2017-06-01 NOTE — Progress Notes (Addendum)
Subjective:  Tony Nguyen is a 57 y.o. male who presents to the [facility] today for annual wellness exam.  HPI:  Skin rash - similar to previous one he had last year. Started on upper back and spread, has a patch on elbow as well - improved with lamictal, reoccured over last few months  - very itchy - only bleed with scratching  L knee pain - intermittent - improved with stretching  - not present today - top of knee, sharp - worse with climbing stairs, less with going down steps - no weakness   Patient Active Problem List   Diagnosis Date Noted  . Prediabetes 04/17/2016  . Health care maintenance 04/16/2016  . Knee pain, bilateral 10/07/2013  . Itching 01/07/2013  . Leg cramps 11/18/2012  . OBESITY, NOS 04/16/2006  ;  Past Medical History:  Diagnosis Date  . Duodenal ulcer due to Helicobacter pylori 09/25/2010   EGD confirmed that duodenal ulcers and H. pylori gastritis in 08/2009. Treated with triple  therapy at [facility].   Marland Kitchen. Gallbladder polyp 09/25/2010  . Major depression in remission (HCC)   . OTHER DISEASES OF NASAL CAVITY AND SINUSES 02/23/2008   Qualifier: Diagnosis of  By: Daphine DeutscherMartin MD, Corrie DandyMary    . RHINITIS, CHRONIC 04/16/2006   Qualifier: Diagnosis of  By: Bebe ShaggyWOODBURY, ANGELICA    . TENNIS ELBOW 04/16/2006   Qualifier: Diagnosis of  By: Bebe ShaggyWOODBURY, ANGELICA     Past Surgical History:  Procedure Laterality Date  . APPENDECTOMY  1981  . CHOLECYSTECTOMY  10/15/10   Path results revealed chronic cholecystitis  . ELBOW SURGERY  2005   right  . UPPER GASTROINTESTINAL ENDOSCOPY     Social History   Socioeconomic History  . Marital status: Married    Spouse name: Not on file  . Number of children: 2  . Years of education: Not on file  . Highest education level: Not on file  Occupational History  . Occupation: Financial plannerMachine Operator     Employer: [employer]    Comment: [employer]  Social Needs  . Financial resource strain: Not on file  . Food insecurity:    Worry:  Not on file    Inability: Not on file  . Transportation needs:    Medical: Not on file    Non-medical: Not on file  Tobacco Use  . Smoking status: Never Smoker  . Smokeless tobacco: Never Used  Substance and Sexual Activity  . Alcohol use: No  . Drug use: No  . Sexual activity: Yes  Lifestyle  . Physical activity:    Days per week: 1 day    Minutes per session: 90 min  . Stress: Not on file  Relationships  . Social connections:    Talks on phone: Not on file    Gets together: Not on file    Attends religious service: Not on file    Active member of club or organization: Not on file    Attends meetings of clubs or organizations: Not on file    Relationship status: Not on file  . Intimate partner violence:    Fear of current or ex partner: Not on file    Emotionally abused: Not on file    Physically abused: Not on file    Forced sexual activity: Not on file  Other Topics Concern  . Not on file  Social History Narrative   Patient is originally from [place].   Past 2 daughters age 56 and 5713.  Undergoing a separation from his wife.      Works out regularly with running and weightlifting.     ROS: per HPI, otherwise all systems reviewed and negative   Objective:  Physical Exam: BP 116/70   Pulse 86   Temp 98.1 F (36.7 C) (Oral)   Ht 6' (1.829 m)   Wt 259 lb (117.5 kg)   SpO2 96%   BMI 35.13 kg/m   Gen: NAD, resting comfortably CV: RRR with no murmurs appreciated Pulm: NWOB, CTAB with no crackles, wheezes, or rhonchi GI: Normal bowel sounds present. Soft, Nontender, Nondistended. MSK: L knee without ligamentous laxity, no point tenderness. Neg drawer and lachman. No edema or effusion Skin: scaly rash on upper back with central clearing and some small areas os hypopigmentation. Small similar patch L elbow Neuro: grossly normal, moves all extremities Psych: Normal affect and thought content  Results for orders placed or performed in visit on 06/01/17 (from the  past 72 hour(s))  POCT glycosylated hemoglobin (Hb A1C)     Status: None   Collection Time: 06/01/17 10:52 AM  Result Value Ref Range   Hemoglobin A1C 6.2      Assessment/Plan:  Tinea corporis Rash on upper back and L elbow with raised borders and central clearing c/w tinea corporis. Similar to tinea that patient had in the past that resolved with topical lamisil. Discussed treat with lamisil and RTC if spreads or does not improve.  Left knee pain Anterior knee pain at top of patella that occurs the setting of overuse and most exacerbated by ascending stairs is most c/w patellofemoral pain. No trauma or ligamentous laxity to suggest tear.  Will treat conservatively with knee exercises.   OBESITY, NOS Patient counseled on healthy diet and exercise given BMI. He does cook and eats many fish and vegetables but also eats a lot of potatoes and rice so he was counseled regarding his significant carb intake today. He will increase his exercise from once a week to 3 times a week.  Possible exposure to STD Was last sexually active a few weeks ago and is asymptomatic but would like STD testing today. Declined HIV and RPR.  Prediabetes Patient has obesity and a1c 6.2 today still in prediabetic range. Patient was counseled regarding diet and exercise changes. He is agreeable to starting metformin 500mg  daily and follow up in 1 month.    Leland Her, DO PGY-2, [facility] 06/01/2017 10:27 AM

## 2017-06-01 NOTE — Assessment & Plan Note (Signed)
Was last sexually active a few weeks ago and is asymptomatic but would like STD testing today. Declined HIV and RPR.

## 2017-06-01 NOTE — Assessment & Plan Note (Signed)
Anterior knee pain at top of patella that occurs the setting of overuse and most exacerbated by ascending stairs is most c/w patellofemoral pain. No trauma or ligamentous laxity to suggest tear.  Will treat conservatively with knee exercises.

## 2017-06-01 NOTE — Assessment & Plan Note (Signed)
Patient counseled on healthy diet and exercise given BMI. He does cook and eats many fish and vegetables but also eats a lot of potatoes and rice so he was counseled regarding his significant carb intake today. He will increase his exercise from once a week to 3 times a week.

## 2017-06-01 NOTE — Patient Instructions (Addendum)
We talked about getting exercise 3 times a week.  Great job on the eating healthy. We will recheck for prediabetes.  We are checking some labs today. If results require attention, either myself or my nurse will get in touch with you. If everything is normal, you will get a letter in the mail or a message in My Chart. Please give [place] a call if you do not hear from [place] after 2 weeks.   Please bring all of your medications with you to each visit.   Sign up for My Chart to have easy access to your labs results, and communication with your primary care physician.  Feel free to call with any questions or concerns at any time, at [phone].   Take care,  Dr. Leland Her, DO [facility] Patellofemoral Pain Syndrome  Knee Exercises Ask your health care provider which exercises are safe for you. Do exercises exactly as told by your health care provider and adjust them as directed. It is normal to feel mild stretching, pulling, tightness, or discomfort as you do these exercises, but you should stop right away if you feel sudden pain or your pain gets worse.Do not begin these exercises until told by your health care provider. STRETCHING AND RANGE OF MOTION EXERCISES These exercises warm up your muscles and joints and improve the movement and flexibility of your knee. These exercises also help to relieve pain, numbness, and tingling. Exercise A: Knee Extension, Prone 1. Lie on your abdomen on a bed. 2. Place your left / right knee just beyond the edge of the surface so your knee is not on the bed. You can put a towel under your left / right thigh just above your knee for comfort. 3. Relax your leg muscles and allow gravity to straighten your knee. You should feel a stretch behind your left / right knee. 4. Hold this position for __________ seconds. 5. Scoot up so your knee is supported between repetitions. Repeat __________ times. Complete this stretch __________ times a  day. Exercise B: Knee Flexion, Active  1. Lie on your back with both knees straight. If this causes back discomfort, bend your left / right knee so your foot is flat on the floor. 2. Slowly slide your left / right heel back toward your buttocks until you feel a gentle stretch in the front of your knee or thigh. 3. Hold this position for __________ seconds. 4. Slowly slide your left / right heel back to the starting position. Repeat __________ times. Complete this exercise __________ times a day. Exercise C: Quadriceps, Prone  1. Lie on your abdomen on a firm surface, such as a bed or padded floor. 2. Bend your left / right knee and hold your ankle. If you cannot reach your ankle or pant leg, loop a belt around your foot and grab the belt instead. 3. Gently pull your heel toward your buttocks. Your knee should not slide out to the side. You should feel a stretch in the front of your thigh and knee. 4. Hold this position for __________ seconds. Repeat __________ times. Complete this stretch __________ times a day. Exercise D: Hamstring, Supine 1. Lie on your back. 2. Loop a belt or towel over the ball of your left / right foot. The ball of your foot is on the walking surface, right under your toes. 3. Straighten your left / right knee and slowly pull on the belt to raise your leg until you feel a gentle  stretch behind your knee. ? Do not let your left / right knee bend while you do this. ? Keep your other leg flat on the floor. 4. Hold this position for __________ seconds. Repeat __________ times. Complete this stretch __________ times a day. STRENGTHENING EXERCISES These exercises build strength and endurance in your knee. Endurance is the ability to use your muscles for a long time, even after they get tired. Exercise E: Quadriceps, Isometric  1. Lie on your back with your left / right leg extended and your other knee bent. Put a rolled towel or small pillow under your knee if told by your  health care provider. 2. Slowly tense the muscles in the front of your left / right thigh. You should see your kneecap slide up toward your hip or see increased dimpling just above the knee. This motion will push the back of the knee toward the floor. 3. For __________ seconds, keep the muscle as tight as you can without increasing your pain. 4. Relax the muscles slowly and completely. Repeat __________ times. Complete this exercise __________ times a day. Exercise F: Straight Leg Raises - Quadriceps 1. Lie on your back with your left / right leg extended and your other knee bent. 2. Tense the muscles in the front of your left / right thigh. You should see your kneecap slide up or see increased dimpling just above the knee. Your thigh may even shake a bit. 3. Keep these muscles tight as you raise your leg 4-6 inches (10-15 cm) off the floor. Do not let your knee bend. 4. Hold this position for __________ seconds. 5. Keep these muscles tense as you lower your leg. 6. Relax your muscles slowly and completely after each repetition. Repeat __________ times. Complete this exercise __________ times a day. Exercise G: Hamstring, Isometric 1. Lie on your back on a firm surface. 2. Bend your left / right knee approximately __________ degrees. 3. Dig your left / right heel into the surface as if you are trying to pull it toward your buttocks. Tighten the muscles in the back of your thighs to dig as hard as you can without increasing any pain. 4. Hold this position for __________ seconds. 5. Release the tension gradually and allow your muscles to relax completely for __________ seconds after each repetition. Repeat __________ times. Complete this exercise __________ times a day. Exercise H: Hamstring Curls  If told by your health care provider, do this exercise while wearing ankle weights. Begin with __________ weights. Then increase the weight by 1 lb (0.5 kg) increments. Do not wear ankle weights that  are more than __________. 1. Lie on your abdomen with your legs straight. 2. Bend your left / right knee as far as you can without feeling pain. Keep your hips flat against the floor. 3. Hold this position for __________ seconds. 4. Slowly lower your leg to the starting position.  Repeat __________ times. Complete this exercise __________ times a day. Exercise I: Squats (Quadriceps) 1. Stand in front of a table, with your feet and knees pointing straight ahead. You may rest your hands on the table for balance but not for support. 2. Slowly bend your knees and lower your hips like you are going to sit in a chair. ? Keep your weight over your heels, not over your toes. ? Keep your lower legs upright so they are parallel with the table legs. ? Do not let your hips go lower than your knees. ? Do not bend  lower than told by your health care provider. ? If your knee pain increases, do not bend as low. 3. Hold the squat position for __________ seconds. 4. Slowly push with your legs to return to standing. Do not use your hands to pull yourself to standing. Repeat __________ times. Complete this exercise __________ times a day. Exercise J: Wall Slides (Quadriceps)  1. Lean your back against a smooth wall or door while you walk your feet out 18-24 inches (46-61 cm) from it. 2. Place your feet hip-width apart. 3. Slowly slide down the wall or door until your knees bend __________ degrees. Keep your knees over your heels, not over your toes. Keep your knees in line with your hips. 4. Hold for __________ seconds. Repeat __________ times. Complete this exercise __________ times a day. Exercise K: Straight Leg Raises - Hip Abductors 1. Lie on your side with your left / right leg in the top position. Lie so your head, shoulder, knee, and hip line up. You may bend your bottom knee to help you keep your balance. 2. Roll your hips slightly forward so your hips are stacked directly over each other and your  left / right knee is facing forward. 3. Leading with your heel, lift your top leg 4-6 inches (10-15 cm). You should feel the muscles in your outer hip lifting. ? Do not let your foot drift forward. ? Do not let your knee roll toward the ceiling. 4. Hold this position for __________ seconds. 5. Slowly return your leg to the starting position. 6. Let your muscles relax completely after each repetition. Repeat __________ times. Complete this exercise __________ times a day. Exercise L: Straight Leg Raises - Hip Extensors 1. Lie on your abdomen on a firm surface. You can put a pillow under your hips if that is more comfortable. 2. Tense the muscles in your buttocks and lift your left / right leg about 4-6 inches (10-15 cm). Keep your knee straight as you lift your leg. 3. Hold this position for __________ seconds. 4. Slowly lower your leg to the starting position. 5. Let your leg relax completely after each repetition. Repeat __________ times. Complete this exercise __________ times a day. This information is not intended to replace advice given to you by your health care provider. Make sure you discuss any questions you have with your health care provider. Document Released: 12/18/2004 Document Revised: 10/29/2015 Document Reviewed: 12/10/2014 Elsevier Interactive Patient Education  2018 [employer].

## 2017-06-01 NOTE — Assessment & Plan Note (Addendum)
Patient has obesity and a1c 6.2 today still in prediabetic range. Patient was counseled regarding diet and exercise changes. He is agreeable to starting metformin 500mg  daily and follow up in 1 month.

## 2017-06-02 LAB — LIPID PANEL
Chol/HDL Ratio: 4.8 ratio (ref 0.0–5.0)
Cholesterol, Total: 192 mg/dL (ref 100–199)
HDL: 40 mg/dL (ref >39)
LDL Calculated: 137 mg/dL — ABNORMAL HIGH (ref 0–99)
Triglycerides: 75 mg/dL (ref 0–149)
VLDL CHOLESTEROL CAL: 15 mg/dL (ref 5–40)

## 2017-06-02 LAB — URINE CYTOLOGY ANCILLARY ONLY
CHLAMYDIA, DNA PROBE: NEGATIVE
NEISSERIA GONORRHEA: NEGATIVE

## 2017-06-03 ENCOUNTER — Encounter: Payer: Self-pay | Admitting: Family Medicine

## 2017-06-03 ENCOUNTER — Other Ambulatory Visit: Payer: Self-pay | Admitting: Family Medicine

## 2017-06-03 DIAGNOSIS — E785 Hyperlipidemia, unspecified: Secondary | ICD-10-CM

## 2017-06-03 MED ORDER — ATORVASTATIN CALCIUM 40 MG PO TABS: 40.0000 mg | ORAL_TABLET | Freq: Every day | ORAL | 0 refills | Status: DC

## 2017-06-03 NOTE — Progress Notes (Signed)
Based on lipid panel. Patient ASCVD risk 11%. Not able to be on ASA81 due to history of duodenal ulcers. Start atorvastatin 40mg  daily. Patient called and voiced good understanding.

## 2017-07-03 ENCOUNTER — Other Ambulatory Visit: Payer: Self-pay | Admitting: Family Medicine

## 2017-07-03 DIAGNOSIS — R7303 Prediabetes: Secondary | ICD-10-CM

## 2017-07-03 DIAGNOSIS — E785 Hyperlipidemia, unspecified: Secondary | ICD-10-CM

## 2017-09-28 ENCOUNTER — Encounter: Payer: Self-pay | Admitting: Family Medicine

## 2017-09-28 ENCOUNTER — Other Ambulatory Visit (HOSPITAL_COMMUNITY)
Admission: RE | Admit: 2017-09-28 | Discharge: 2017-09-28 | Disposition: A | Discharge status: Home / Self Care | Payer: No Typology Code available for payment source | Source: Ambulatory Visit | Attending: Family Medicine | Admitting: Family Medicine

## 2017-09-28 ENCOUNTER — Ambulatory Visit (INDEPENDENT_AMBULATORY_CARE_PROVIDER_SITE_OTHER): Payer: No Typology Code available for payment source | Admitting: Family Medicine

## 2017-09-28 ENCOUNTER — Other Ambulatory Visit: Payer: Self-pay

## 2017-09-28 VITALS — BP 128/82 | HR 90 | Temp 97.9°F | Ht 72.0 in | Wt 265.0 lb

## 2017-09-28 DIAGNOSIS — W57XXXA Bitten or stung by nonvenomous insect and other nonvenomous arthropods, initial encounter: Secondary | ICD-10-CM

## 2017-09-28 DIAGNOSIS — Z202 Contact with and (suspected) exposure to infections with a predominantly sexual mode of transmission: Secondary | ICD-10-CM

## 2017-09-28 DIAGNOSIS — S40261A Insect bite (nonvenomous) of right shoulder, initial encounter: Secondary | ICD-10-CM | POA: Diagnosis not present

## 2017-09-28 MED ORDER — AZITHROMYCIN 500 MG PO TABS: 500.0000 mg | ORAL_TABLET | Freq: Once | ORAL | Status: DC

## 2017-09-28 MED ORDER — CEFTRIAXONE SODIUM 250 MG IJ SOLR
250.0000 mg | Freq: Once | INTRAMUSCULAR | Status: AC
  Administered 2017-09-28: 250 mg via INTRAMUSCULAR

## 2017-09-28 MED ORDER — AZITHROMYCIN 500 MG PO TABS
1000.0000 mg | ORAL_TABLET | Freq: Once | ORAL | Status: AC
  Administered 2017-09-28: 1000 mg via ORAL

## 2017-09-28 NOTE — Progress Notes (Signed)
    Subjective:  Tony Nguyen is a 57 y.o. male who presents to the [facility] today with a chief complaint of rash and STD exposure.   HPI:  Rash A couple of weeks ago he had a itchy spot on the back of his R arm/shoulder area. Then noticed a couple more bumps. Has been putting a OTC ointment on it which helps. No longer scratching at it. Seems to be healing. Bumps are not spreading. Has not had any new rashes.   STD exposure Was told by his sexual partner that she has an STD and he needs to be treated. She could not remember the name of the STD. He states he is not having any symptoms. No dysuria or discharge. No rashes. No itching.  ROS: Per HPI  Objective:  Physical Exam: BP 128/82   Pulse 90   Temp 97.9 F (36.6 C) (Oral)   Ht 6' (1.829 m)   Wt 265 lb (120.2 kg)   SpO2 97%   BMI 35.94 kg/m   Gen: NAD, resting comfortably Skin:  4-5 scattered small circular excoriated papules on R upper back/arm Neuro: grossly normal, moves all extremities Psych: Normal affect and thought content   Assessment/Plan:  1. Exposure to STD Given unknown STD exposure, will treat presumptively with 1g azithromycin and 250mg  Ctx today. STD testing obtained as well - Urine cytology ancillary only - cefTRIAXone (ROCEPHIN) injection 250 mg - azithromycin (ZITHROMAX) tablet 1,000 mg  2. Insect bite of right shoulder, initial encounter Rash appears to be most consistent with insect bites that are healing well. Patient reassured.   Leland HerElsia J Christine Morton, DO PGY-3, [facility] 09/28/2017 4:03 PM

## 2017-09-28 NOTE — Patient Instructions (Signed)
Your rash looks like insect bites that are healing well  We are checking some labs today. If results require attention, either myself or my nurse will get in touch with you. If everything is normal, you will get a letter in the mail or a message in My Chart. Please give us a call if you do not hear from us after 2 weeks.   Sign up for My Chart to have easy access to your labs results, and communication with your primary care physician.  Feel free to call with any questions or concerns at any time, at [phone].   Take care,  Dr. Leland HerElsia J Yina Riviere, DO [facility]

## 2017-09-29 ENCOUNTER — Encounter: Payer: Self-pay | Admitting: Family Medicine

## 2017-09-29 LAB — URINE CYTOLOGY ANCILLARY ONLY
CHLAMYDIA, DNA PROBE: NEGATIVE
Neisseria Gonorrhea: NEGATIVE
Trichomonas: NEGATIVE

## 2017-12-10 ENCOUNTER — Telehealth: Payer: Self-pay | Admitting: Family Medicine

## 2017-12-10 NOTE — Progress Notes (Deleted)
  Subjective:   Patient ID: Tony Nguyen    DOB: 06/08/1960, 57 y.o. male   MRN: [id number]  Tony Nguyen is a 57 y.o. male with a history of obesity, prediabetes here for   Joint pain - ***  Review of Systems:  Per HPI.  PMFSH, medications and smoking status reviewed.  Objective:   There were no vitals taken for this visit. Vitals and nursing note reviewed.  General: well nourished, well developed, in no acute distress with non-toxic appearance HEENT: normocephalic, atraumatic, moist mucous membranes Neck: supple, non-tender without lymphadenopathy CV: regular rate and rhythm without murmurs, rubs, or gallops, no lower extremity edema Lungs: clear to auscultation bilaterally with normal work of breathing Abdomen: soft, non-tender, non-distended, no masses or organomegaly palpable, normoactive bowel sounds Skin: warm, dry, no rashes or lesions Extremities: warm and well perfused, normal tone MSK: ROM grossly intact, strength intact, gait normal Neuro: Alert and oriented, speech normal  Assessment & Plan:   No problem-specific Assessment & Plan notes found for this encounter.  No orders of the defined types were placed in this encounter.  No orders of the defined types were placed in this encounter.   Ellwood Dense, DO PGY-2, [facility] 12/10/2017 8:00 PM

## 2017-12-10 NOTE — Telephone Encounter (Signed)
Can be printed for him at tomorrows visit.

## 2017-12-10 NOTE — Telephone Encounter (Signed)
Pt has an appointment tomorrow 12/11/17 at 3:10. He would like to know if he can receive a copy of his results from his last visit 09/28/17. He said he had them but has misplaces them.

## 2017-12-11 ENCOUNTER — Ambulatory Visit: Payer: No Typology Code available for payment source

## 2018-01-07 ENCOUNTER — Ambulatory Visit: Payer: No Typology Code available for payment source | Admitting: Family Medicine

## 2018-04-20 ENCOUNTER — Encounter: Payer: Self-pay | Admitting: Family Medicine

## 2018-04-20 ENCOUNTER — Other Ambulatory Visit: Payer: Self-pay

## 2018-04-20 ENCOUNTER — Ambulatory Visit (INDEPENDENT_AMBULATORY_CARE_PROVIDER_SITE_OTHER): Payer: No Typology Code available for payment source | Admitting: Family Medicine

## 2018-04-20 VITALS — BP 132/81 | HR 88 | Temp 98.5°F | Ht 72.0 in | Wt 265.0 lb

## 2018-04-20 DIAGNOSIS — Z0000 Encounter for general adult medical examination without abnormal findings: Secondary | ICD-10-CM

## 2018-04-20 DIAGNOSIS — R7303 Prediabetes: Secondary | ICD-10-CM | POA: Diagnosis not present

## 2018-04-20 DIAGNOSIS — M67432 Ganglion, left wrist: Secondary | ICD-10-CM

## 2018-04-20 DIAGNOSIS — B354 Tinea corporis: Secondary | ICD-10-CM

## 2018-04-20 DIAGNOSIS — Z23 Encounter for immunization: Secondary | ICD-10-CM | POA: Diagnosis not present

## 2018-04-20 LAB — POCT GLYCOSYLATED HEMOGLOBIN (HGB A1C): HbA1c, POC (controlled diabetic range): 6.3 % (ref 0.0–7.0)

## 2018-04-20 MED ORDER — TERBINAFINE HCL 1 % EX CREA: 1.0000 "application " | TOPICAL_CREAM | Freq: Two times a day (BID) | CUTANEOUS | 0 refills | Status: DC

## 2018-04-20 NOTE — Progress Notes (Signed)
Subjective:  Tony Nguyen is a 57 y.o. male who presents to the [facility] today with a chief complaint of itching and bump on hand ans for annual wellness.   HPI:  He states that he has overall been in good health lately.  He has 2 complaints today that he would like to discuss.  Bump on L hand He is noticed a bump on the top of his left wrist that appeared a few months ago.  It is not painful.  He has no numbness or tingling in his hand.  It has not impeded his function but only bothers him because it appears unsightly.  It is not warm.  No drainage.  Itching  He is having itching on his upper back similar to the fungal rash she had had before that resolved with topical Lamisil.  Is having similar symptoms.  He moisturizes regularly with cocoa butter.  No polyuria or polydipsia. Continues today metformin 500 mg daily for his prediabetes, compliant, tolerating well.    ROS: per HPI, otherwise all systems reviewed and negative  PMH:  The following were reviewed and entered/updated in epic: Past Medical History:  Diagnosis Date  . Duodenal ulcer due to Helicobacter pylori 09/25/2010   EGD confirmed that duodenal ulcers and H. pylori gastritis in 08/2009. Treated with triple  therapy at [facility].   Marland Kitchen Gallbladder polyp 09/25/2010  . Major depression in remission (HCC)   . OTHER DISEASES OF NASAL CAVITY AND SINUSES 02/23/2008   Qualifier: Diagnosis of  By: Daphine Deutscher MD, Corrie Dandy    . RHINITIS, CHRONIC 04/16/2006   Qualifier: Diagnosis of  By: Bebe Shaggy    . TENNIS ELBOW 04/16/2006   Qualifier: Diagnosis of  By: Bebe Shaggy     Past Surgical History:  Procedure Laterality Date  . APPENDECTOMY  1981  . CHOLECYSTECTOMY  10/15/10   Path results revealed chronic cholecystitis  . ELBOW SURGERY  2005   right  . UPPER GASTROINTESTINAL ENDOSCOPY      Social History   Tobacco Use  . Smoking status: Never Smoker  . Smokeless tobacco: Never Used  Substance Use Topics  .  Alcohol use: No  . Drug use: No    Objective:  Physical Exam: BP 132/81   Pulse 88   Temp 98.5 F (36.9 C) (Oral)   Ht 6' (1.829 m)   Wt 265 lb (120.2 kg)   SpO2 97%   BMI 35.94 kg/m   Gen: NAD, resting comfortably HEENT: , AT.  TMs pearly bilaterally.  Nasal mucosa healthy.  Oropharynx nonerythematous CV: RRR with no murmurs appreciated Pulm: NWOB, CTAB with no crackles, wheezes, or rhonchi GI: Normal bowel sounds present. Soft, Nontender, Nondistended. MSK: no edema, cyanosis, or clubbing noted.  Dorsum of left wrist with a 1 cm cyst without erythema or warmth Skin: Scaly rash on bilateral upper shoulders with central clearing. Neuro: grossly normal, moves all extremities Psych: Normal affect and thought content   Assessment/Plan:  Health care maintenance Patient agreeable to influenza vaccination today.  Is otherwise up-to-date with health maintenance.  Tinea corporis Patient has rash on his upper back consistent with tinea corporis that was similar to previous.  Treat with topical Lamisil as patient has had good success with this in the past.  Advised switching away from cocoa butter as prevention.  Prediabetes Doing well on metformin 500 mg daily.  Recheck A1c and BMP today  Ganglion cyst of dorsum of left wrist Patient has a ganglion cyst  on the dorsum of his left wrist that is asymptomatic.  Advised against surgical management due to risk of recurrence and since he is having no symptoms.  Discussed that if he develops symptoms that could refer for surgical removal at that time.  Patient voiced good understanding   Leland Her, DO PGY-3, [facility] 04/20/2018 3:34 PM

## 2018-04-20 NOTE — Assessment & Plan Note (Signed)
Patient has rash on his upper back consistent with tinea corporis that was similar to previous.  Treat with topical Lamisil as patient has had good success with this in the past.  Advised switching away from cocoa butter as prevention.

## 2018-04-20 NOTE — Assessment & Plan Note (Addendum)
Patient agreeable to influenza vaccination today.  Is otherwise up-to-date with health maintenance.

## 2018-04-20 NOTE — Assessment & Plan Note (Signed)
Doing well on metformin 500 mg daily.  Recheck A1c and BMP today

## 2018-04-20 NOTE — Patient Instructions (Signed)
Lamisil cream for your fungal rash. Switch from cocoa butter to a different moisturizer    Ganglion cyst is not dangerous. No surgery unless you have numbness or tingling in your hand.    We are checking some labs today. If results require attention, either myself or my nurse will get in touch with you. If everything is normal, you will get a letter in the mail or a message in My Chart. Please give [place] a call if you do not hear from [place] after 2 weeks.

## 2018-04-20 NOTE — Assessment & Plan Note (Signed)
Patient has a ganglion cyst on the dorsum of his left wrist that is asymptomatic.  Advised against surgical management due to risk of recurrence and since he is having no symptoms.  Discussed that if he develops symptoms that could refer for surgical removal at that time.  Patient voiced good understanding

## 2018-04-21 ENCOUNTER — Encounter: Payer: Self-pay | Admitting: Family Medicine

## 2018-04-21 LAB — BASIC METABOLIC PANEL
BUN/Creatinine Ratio: 11 (ref 9–20)
BUN: 13 mg/dL (ref 6–24)
CALCIUM: 9.3 mg/dL (ref 8.7–10.2)
CO2: 25 mmol/L (ref 20–29)
Chloride: 104 mmol/L (ref 96–106)
Creatinine, Ser: 1.22 mg/dL (ref 0.76–1.27)
GFR calc Af Amer: 76 mL/min/{1.73_m2} (ref >59)
GFR calc non Af Amer: 66 mL/min/{1.73_m2} (ref >59)
Glucose: 114 mg/dL — ABNORMAL HIGH (ref 65–99)
POTASSIUM: 4.3 mmol/L (ref 3.5–5.2)
Sodium: 140 mmol/L (ref 134–144)

## 2018-07-23 ENCOUNTER — Ambulatory Visit (INDEPENDENT_AMBULATORY_CARE_PROVIDER_SITE_OTHER): Payer: No Typology Code available for payment source | Admitting: Family Medicine

## 2018-07-23 ENCOUNTER — Encounter: Payer: Self-pay | Admitting: Family Medicine

## 2018-07-23 ENCOUNTER — Other Ambulatory Visit: Payer: Self-pay

## 2018-07-23 VITALS — BP 114/66 | HR 84 | Ht 72.0 in | Wt 240.5 lb

## 2018-07-23 DIAGNOSIS — N529 Male erectile dysfunction, unspecified: Secondary | ICD-10-CM | POA: Diagnosis not present

## 2018-07-23 MED ORDER — SILDENAFIL CITRATE 100 MG PO TABS: 100.0000 mg | ORAL_TABLET | Freq: Every day | ORAL | 0 refills | Status: DC | PRN

## 2018-07-23 NOTE — Patient Instructions (Signed)
Sildenafil tablets (Erectile Dysfunction)  What is this medicine?  SILDENAFIL (sil DEN a fil) is used to treat erection problems in men.  This medicine may be used for other purposes; ask your health care provider or pharmacist if you have questions.  COMMON BRAND NAME(S): Viagra  What should I tell my health care provider before I take this medicine?  They need to know if you have any of these conditions:  -bleeding disorders  -eye or vision problems, including a rare inherited eye disease called retinitis pigmentosa  -anatomical deformation of the penis, Peyronie's disease, or history of priapism (painful and prolonged erection)  -heart disease, angina, a history of heart attack, irregular heart beats, or other heart problems  -high or low blood pressure  -history of blood diseases, like sickle cell anemia or leukemia  -history of stomach bleeding  -kidney disease  -liver disease  -stroke  -an unusual or allergic reaction to sildenafil, other medicines, foods, dyes, or preservatives  -pregnant or trying to get pregnant  -breast-feeding  How should I use this medicine?  Take this medicine by mouth with a glass of water. Follow the directions on the prescription label. The dose is usually taken 1 hour before sexual activity. You should not take the dose more than once per day. Do not take your medicine more often than directed.  Talk to your pediatrician regarding the use of this medicine in children. This medicine is not used in children for this condition.  Overdosage: If you think you have taken too much of this medicine contact a poison control center or emergency room at once.  NOTE: This medicine is only for you. Do not share this medicine with others.  What if I miss a dose?  This does not apply. Do not take double or extra doses.  What may interact with this medicine?  Do not take this medicine with any of the following medications:  -cisapride  -nitrates like amyl nitrite, isosorbide dinitrate, isosorbide  mononitrate, nitroglycerin  -riociguat  This medicine may also interact with the following medications:  -antiviral medicines for HIV or AIDS  -bosentan  -certain medicines for benign prostatic hyperplasia (BPH)  -certain medicines for blood pressure  -certain medicines for fungal infections like ketoconazole and itraconazole  -cimetidine  -erythromycin  -rifampin  This list may not describe all possible interactions. Give your health care provider a list of all the medicines, herbs, non-prescription drugs, or dietary supplements you use. Also tell them if you smoke, drink alcohol, or use illegal drugs. Some items may interact with your medicine.  What should I watch for while using this medicine?  If you notice any changes in your vision while taking this drug, call your doctor or health care professional as soon as possible. Stop using this medicine and call your health care provider right away if you have a loss of sight in one or both eyes.  Contact your doctor or health care professional right away if you have an erection that lasts longer than 4 hours or if it becomes painful. This may be a sign of a serious problem and must be treated right away to prevent permanent damage.  If you experience symptoms of nausea, dizziness, chest pain or arm pain upon initiation of sexual activity after taking this medicine, you should refrain from further activity and call your doctor or health care professional as soon as possible.  Do not drink alcohol to excess (examples, 5 glasses of wine or 5 shots of   whiskey) when taking this medicine. When taken in excess, alcohol can increase your chances of getting a headache or getting dizzy, increasing your heart rate or lowering your blood pressure.  Using this medicine does not protect you or your partner against HIV infection (the virus that causes AIDS) or other sexually transmitted diseases.  What side effects may I notice from receiving this medicine?  Side effects that you  should report to your doctor or health care professional as soon as possible:  -allergic reactions like skin rash, itching or hives, swelling of the face, lips, or tongue  -breathing problems  -changes in hearing  -changes in vision  -chest pain  -fast, irregular heartbeat  -prolonged or painful erection  -seizures  Side effects that usually do not require medical attention (report to your doctor or health care professional if they continue or are bothersome):  -back pain  -dizziness  -flushing  -headache  -indigestion  -muscle aches  -nausea  -stuffy or runny nose  This list may not describe all possible side effects. Call your doctor for medical advice about side effects. You may report side effects to FDA at 1-800-FDA-1088.  Where should I keep my medicine?  Keep out of reach of children.  Store at room temperature between 15 and 30 degrees C (59 and 86 degrees F). Throw away any unused medicine after the expiration date.  NOTE: This sheet is a summary. It may not cover all possible information. If you have questions about this medicine, talk to your doctor, pharmacist, or health care provider.   2019 Elsevier/Gold Standard (2015-01-17 12:00:25)

## 2018-07-23 NOTE — Assessment & Plan Note (Signed)
Patient with erectile dysfunction. No red flags. No medical contraindications. Trial of sildenafil.  Discussed risks and benefits.  Patient voiced good understanding.

## 2018-07-23 NOTE — Progress Notes (Signed)
    Subjective:  Tony Nguyen is a 58 y.o. male who presents to the [facility] today with a chief complaint of erectile dysfunction.   HPI:  Patient states that he has been under a lot of stress over the last couple of weeks.  He has noticed that he has trouble maintaining his erections.  He is able to have an erection with foreplay but it is not hard enough for penetration.  He has no pain, curvature, urinary symptoms.  He is never had any issues with his erections before.  He is not on any nitrate medications.  ROS: Per HPI   CC, SH/smoking status, and VS noted  Objective:  Physical Exam: BP 114/66   Pulse 84   Ht 6' (1.829 m)   Wt 240 lb 8 oz (109.1 kg)   SpO2 97%   BMI 32.62 kg/m   Gen: NAD, resting comfortably Pulm: NWOB Neuro: grossly normal, moves all extremities Psych: Normal affect and thought content   Assessment/Plan:  Erectile dysfunction Patient with erectile dysfunction. No red flags. No medical contraindications. Trial of sildenafil.  Discussed risks and benefits.  Patient voiced good understanding.    Meds ordered this encounter  Medications  . sildenafil (VIAGRA) 100 MG tablet    Sig: Take 1 tablet (100 mg total) by mouth daily as needed for erectile dysfunction.    Dispense:  10 tablet    Refill:  0    Leland Her, DO PGY-3, [facility] 07/23/2018 4:10 PM

## 2018-08-06 ENCOUNTER — Ambulatory Visit: Payer: No Typology Code available for payment source

## 2018-08-09 ENCOUNTER — Other Ambulatory Visit: Payer: Self-pay

## 2018-08-09 ENCOUNTER — Ambulatory Visit (INDEPENDENT_AMBULATORY_CARE_PROVIDER_SITE_OTHER)
Payer: No Typology Code available for payment source | Admitting: Student in an Organized Health Care Education/Training Program

## 2018-08-09 VITALS — BP 108/60 | HR 85

## 2018-08-09 DIAGNOSIS — W57XXXA Bitten or stung by nonvenomous insect and other nonvenomous arthropods, initial encounter: Secondary | ICD-10-CM

## 2018-08-09 DIAGNOSIS — N529 Male erectile dysfunction, unspecified: Secondary | ICD-10-CM

## 2018-08-09 HISTORY — DX: Bitten or stung by nonvenomous insect and other nonvenomous arthropods, initial encounter: W57.XXXA

## 2018-08-09 MED ORDER — TADALAFIL 10 MG PO TABS: 10.0000 mg | ORAL_TABLET | Freq: Every day | ORAL | 0 refills | Status: DC | PRN

## 2018-08-09 MED ORDER — HYDROCORTISONE 1 % EX OINT: 1.0000 "application " | TOPICAL_OINTMENT | Freq: Two times a day (BID) | CUTANEOUS | 0 refills | Status: DC

## 2018-08-09 NOTE — Assessment & Plan Note (Signed)
No evidence of low T. Life stressors seem to be most likely etiology for ED. There is some variability in the history; previously endorsed ability to achieve an erection two weeks ago. Today he feels he has not had an erection in one month including nocturnal erection.  - cialis 10 mg PRN erection, no more than once per day. - if 10 mg does not work for him, the next day he may try 20 mg, this is the maximum dose - follow up with PCP was made to explore life stressors. Would get a PHQ9 and GAD7 at that visit.

## 2018-08-09 NOTE — Patient Instructions (Signed)
It was a pleasure seeing you today in our clinic.   Our clinic's number is [phone]. Please call with questions or concerns about what we discussed today.  Be well, Dr. Young Mulvey   

## 2018-08-09 NOTE — Assessment & Plan Note (Signed)
No evidence of infection.  - hydrocortisone 1 % ointment; Apply 1 application topically 2 (two) times daily. As needed for bug bite itching  Dispense: 30 g; Refill: 0

## 2018-08-09 NOTE — Progress Notes (Signed)
   CC: bug bite, ED  HPI: Tony Nguyen is a 58 y.o. male   Bug bite Patient reports that [place] he was bit by a horse fly.  He reports that he has had severe itching in the area of the [place].  No fevers.  No pain.  He would like this looked at today.  Erectile dysfunction Patient reports that he is experiencing erectile dysfunction.  He was previously seen for this problem by his doctor 2 weeks ago.  The patient tried Viagra 50 mg, Viagra 100 mg and then even tried Viagra 150 mg without any change in his ability to maintain an erection. He reports that he has had no erection in the last month.  Per chart review, 2 weeks ago he reported that he was having trouble maintaining erections.  Today he reports that he has a lot of life stressors.  He reports that he is no longer having nocturnal erections.  He was tested for diabetes and renal disease previously with no abnormalities.  Additionally he denies changes in hair testicular size or facial hair growth to think of low testosterone.    Review of Symptoms:  See HPI for ROS.   CC, SH/smoking status, and VS noted.  Objective: BP 108/60   Pulse 85   SpO2 98%  GEN: NAD, alert, cooperative, and pleasant. RESPIRATORY: Comfortable work of breathing, speaks in full sentences CV: Regular rate noted, distal extremities well perfused and warm without edema GI: Soft, nondistended SKIN: warm and dry. Tiny, scabbed, well-healing lesion in the middle of his back without surrounding erythema or warmth. No drainage. NEURO: II-XII grossly intact MSK: Moves 4 extremities equally PSYCH: AAOx3, appropriate affect  Assessment and plan:  Erectile dysfunction No evidence of low T. Life stressors seem to be most likely etiology for ED. There is some variability in the history; previously endorsed ability to achieve an erection two weeks ago. Today he feels he has not had an erection in one month including nocturnal erection.   - cialis 10 mg PRN erection, no more than once per day. - if 10 mg does not work for him, the next day he may try 20 mg, this is the maximum dose - follow up with PCP was made to explore life stressors. Would get a PHQ9 and GAD7 at that visit.  Bug bite No evidence of infection.  - hydrocortisone 1 % ointment; Apply 1 application topically 2 (two) times daily. As needed for bug bite itching  Dispense: 30 g; Refill: 0    Meds ordered this encounter  Medications  . tadalafil (CIALIS) 10 MG tablet    Sig: Take 1 tablet (10 mg total) by mouth daily as needed for erectile dysfunction.    Dispense:  10 tablet    Refill:  0  . hydrocortisone 1 % ointment    Sig: Apply 1 application topically 2 (two) times daily. As needed for bug bite itching    Dispense:  30 g    Refill:  0     Everrett Coombe, MD,MS,  PGY3 08/09/2018 4:27 PM

## 2018-08-13 ENCOUNTER — Other Ambulatory Visit: Payer: Self-pay

## 2018-08-13 ENCOUNTER — Encounter: Payer: Self-pay | Admitting: Family Medicine

## 2018-08-13 ENCOUNTER — Ambulatory Visit (INDEPENDENT_AMBULATORY_CARE_PROVIDER_SITE_OTHER): Payer: No Typology Code available for payment source | Admitting: Family Medicine

## 2018-08-13 VITALS — BP 110/60 | HR 68 | Ht 72.0 in | Wt 249.1 lb

## 2018-08-13 DIAGNOSIS — R7303 Prediabetes: Secondary | ICD-10-CM

## 2018-08-13 DIAGNOSIS — W57XXXD Bitten or stung by nonvenomous insect and other nonvenomous arthropods, subsequent encounter: Secondary | ICD-10-CM | POA: Diagnosis not present

## 2018-08-13 DIAGNOSIS — N529 Male erectile dysfunction, unspecified: Secondary | ICD-10-CM | POA: Diagnosis not present

## 2018-08-13 LAB — POCT GLYCOSYLATED HEMOGLOBIN (HGB A1C): HbA1c, POC (controlled diabetic range): 6 % (ref 0.0–7.0)

## 2018-08-13 MED ORDER — TADALAFIL 10 MG PO TABS: 10.0000 mg | ORAL_TABLET | Freq: Every day | ORAL | 0 refills | Status: DC | PRN

## 2018-08-13 NOTE — Assessment & Plan Note (Addendum)
Resent Cialis to patient pharmacy.  Offered blood work but patient declined at this time.  If he continues to have issues with erectile dysfunction then would reassess for depression or anxiety more formally with a PHQ 9 and gad 7.  Also discussed possibility of checking labs with morning testosterone level, patient voiced good understanding but prefers to give Cialis a try first before proceeding with bloodwork.

## 2018-08-13 NOTE — Progress Notes (Deleted)
    Subjective:  Tony Nguyen is a 57 y.o. male who presents to the [facility] today with a chief complaint of ***.   HPI:   ***HIST  CC, SH/smoking status, and VS noted  Objective:  Physical Exam: BP 110/60   Pulse 68   Wt 249 lb 2 oz (113 kg)   SpO2 97%   BMI 33.79 kg/m   Gen: ***NAD, resting comfortably CV: RRR with no murmurs appreciated Pulm: NWOB, CTAB with no crackles, wheezes, or rhonchi GI: Normal bowel sounds present. Soft, Nontender, Nondistended. MSK: no edema, cyanosis, or clubbing noted Skin: warm, dry Neuro: grossly normal, moves all extremities Psych: Normal affect and thought content  Results for orders placed or performed in visit on 08/13/18 (from the past 72 hour(s))  HgB A1c     Status: None   Collection Time: 08/13/18  3:29 PM  Result Value Ref Range   Hemoglobin A1C     HbA1c POC (<> result, manual entry)     HbA1c, POC (prediabetic range)     HbA1c, POC (controlled diabetic range) 6.0 0.0 - 7.0 %     Assessment/Plan:  No problem-specific Assessment & Plan notes found for this encounter.    Orders Placed This Encounter  Procedures  . HgB A1c    No orders of the defined types were placed in this encounter.   Health Maintenance reviewed - {health maintenance:[id number]}.  Bufford Lope, DO PGY-3, [facility] 08/13/2018 3:35 PM

## 2018-08-13 NOTE — Assessment & Plan Note (Signed)
Stable  Continue current management

## 2018-08-13 NOTE — Patient Instructions (Signed)
Bug bite is healing well. Looks dry in the area so try some lotion.  Please try the cialis. If it is not helpful we can always do bloodwork or evaluate your stress levels more formally.

## 2018-08-13 NOTE — Assessment & Plan Note (Signed)
Healing well.  Recommended good skin care with moisturization

## 2018-08-13 NOTE — Progress Notes (Signed)
    Subjective:  Tony Nguyen is a 58 y.o. male who presents to the [facility] today with a chief complaint of erectile dysfunction and f/u on bug bite  HPI:  Patient states that he was unable to pick up the Cialis because it was not at his pharmacy.  He states that the Viagra was not very helpful for him.  He states that he continues to be under a lot of stress particularly financially.  He states that he feels safe in his relationship but is not entirely happy.  However he does not feel ready to give up on this relationship and they are trying to currently work through things.  He thinks that he has not never had sexual dysfunction with any other relationships in his life and that this is really related around his mental state right now with this particular person. He does say that sometimes he has morning erections but they are not enough for an sexual intercourse.  He is not interested in any counseling at this time.  He states that the skin around his back where his bug bite was is been a little bit itchy.  He has not noticed any fever, chills, drainage, warmth, redness  ROS: Per HPI   CC, SH/smoking status, and VS noted  Objective:  Physical Exam: BP 110/60   Pulse 68   Ht 6' (1.829 m)   Wt 249 lb 2 oz (113 kg)   SpO2 97%   BMI 33.79 kg/m   Gen: NAD, resting comfortably Pulm: NWOB Skin: 1 mm well-healing scab in the middle of his back without surrounding erythema or warmth.  No drainage. Neuro: grossly normal, moves all extremities Psych: Normal affect and thought content  Results for orders placed or performed in visit on 08/13/18 (from the past 72 hour(s))  HgB A1c     Status: None   Collection Time: 08/13/18  3:29 PM  Result Value Ref Range   Hemoglobin A1C     HbA1c POC (<> result, manual entry)     HbA1c, POC (prediabetic range)     HbA1c, POC (controlled diabetic range) 6.0 0.0 - 7.0 %     Assessment/Plan:  Prediabetes Stable.  Continue current management  Erectile dysfunction Resent Cialis to patient pharmacy.  Offered blood work but patient declined at this time.  If he continues to have issues with erectile dysfunction then would reassess for depression or anxiety more formally with a PHQ 9 and gad 7.  Also discussed possibility of checking labs with morning testosterone level, patient voiced good understanding but prefers to give Cialis a try first before proceeding with bloodwork.  Bug bite Healing well.  Recommended good skin care with moisturization   Orders Placed This Encounter  Procedures  . HgB A1c    Meds ordered this encounter  Medications  . tadalafil (CIALIS) 10 MG tablet    Sig: Take 1 tablet (10 mg total) by mouth daily as needed for erectile dysfunction.    Dispense:  10 tablet    Refill:  0   Bufford Lope, DO PGY-3, [facility] 08/13/2018 3:42 PM

## 2019-02-09 ENCOUNTER — Other Ambulatory Visit: Payer: Self-pay

## 2019-02-09 ENCOUNTER — Ambulatory Visit (INDEPENDENT_AMBULATORY_CARE_PROVIDER_SITE_OTHER): Payer: PRIVATE HEALTH INSURANCE | Admitting: Family Medicine

## 2019-02-09 VITALS — BP 128/76 | HR 79

## 2019-02-09 DIAGNOSIS — M545 Low back pain, unspecified: Secondary | ICD-10-CM

## 2019-02-09 DIAGNOSIS — Z23 Encounter for immunization: Secondary | ICD-10-CM | POA: Diagnosis not present

## 2019-02-09 MED ORDER — TRIAMCINOLONE ACETONIDE 0.1 % EX OINT: 1.0000 "application " | TOPICAL_OINTMENT | Freq: Two times a day (BID) | CUTANEOUS | 0 refills | Status: DC

## 2019-02-09 MED ORDER — IBUPROFEN 800 MG PO TABS: 800.0000 mg | ORAL_TABLET | Freq: Three times a day (TID) | ORAL | 0 refills | Status: DC | PRN

## 2019-02-09 NOTE — Progress Notes (Signed)
  Subjective:  Patient ID: Tony Nguyen  DOB: [birth year] MRN: [id number]  Abdul-Rashid I Knippel is a 58 y.o. male with a PMH of prediabetes, here today for low back pain.   HPI:  Low back pain: -Patient reports for the for the past 2 weeks he has had pain in his lower back.  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  States that when he bent down to lift a box he felt pain in his back.  Stated that he took some ibuprofen and improved.  However the next day he was getting something out of his trunk and bent over and then felt the pain again.  Since then he has had this pain.  He states that there is no numbness or tingling associated.  It does not radiate down either of his legs.  He does not have any weakness.  No gait change.  He denies any fevers.  Denies any other trauma to his back.  Has never had any problems with his back before.  ROS: As mentioned in HPI  Social hx: Denies use of illicit drugs, alcohol [place] Smoking status reviewed  Patient Active Problem List   Diagnosis Date Noted  . Acute midline low back pain without sciatica 02/13/2019  . Bug bite 08/09/2018  . Erectile dysfunction 07/23/2018  . Ganglion cyst of dorsum of left wrist 04/20/2018  . Tinea corporis 06/01/2017  . Prediabetes 04/17/2016  . OBESITY, NOS 04/16/2006     Objective:  BP 128/76   Pulse 79   SpO2 98%   Vitals and nursing note reviewed  General: NAD, pleasant Pulm: normal effort Low back: FROM, no midline tenderness Extremities: Strength 5/5 in BLLE. No edema or cyanosis. WWP. Skin: warm and dry, no rashes noted Neuro: alert and oriented, no focal deficits Psych: normal affect, normal thought content  Assessment & Plan:   Acute midline low back pain without sciatica No red flags.  Patient with 2 weeks of back pain due to heavy lifting.  Advised patient to begin lifting with his legs.  Provided with exercises and stretches that he may do at home.  In the meantime he  is to use heat compresses for his pain.  Given ibuprofen helped with his pain will trial scheduled ibuprofen for 2 weeks.  He is to return if his pain does not improve or worsens.  Strict return precautions discussed, patient voiced understanding.   [place] Vieno Tarrant, DO Family Medicine Resident PGY-3

## 2019-02-09 NOTE — Patient Instructions (Addendum)
Thank you for coming to see me today. It was a pleasure! Today we talked about:   For your back pain: it is likely that you have pulled a muscle in the back.  I recommend that you start using a heating pad on the area.  I have also sent in ibuprofen to your pharmacy that you can use for inflammation and pain for the next 2 weeks.  You may take 1 pill 3 times a day.  If you start developing any weakness in your legs or numbness and tingling then please do not hesitate to come back to the doctor's office to be seen.  Please be sure to try to lift with your legs at your job.  For the dry skin and itching on your back: I have sent in a steroid cream that you can use on the area for 2 weeks.  You may use this with cocoa butter or a cream such as Eucerin or Cetaphil.   Please follow-up if your back pain does not improve within 6 weeks or sooner as needed.  If you have any questions or concerns, please do not hesitate to call the office at [phone].  Take Care,   Tony Dekota Shenk, DO  Low Back Sprain or Strain Rehab Ask your health care provider which exercises are safe for you. Do exercises exactly as told by your health care provider and adjust them as directed. It is normal to feel mild stretching, pulling, tightness, or discomfort as you do these exercises. Stop right away if you feel sudden pain or your pain gets worse. Do not begin these exercises until told by your health care provider. Stretching and range-of-motion exercises These exercises warm up your muscles and joints and improve the movement and flexibility of your back. These exercises also help to relieve pain, numbness, and tingling. Lumbar rotation  1. Lie on your back on a firm surface and bend your knees. 2. Straighten your arms out to your sides so each arm forms a 90-degree angle (right angle) with a side of your body. 3. Slowly move (rotate) both of your knees to one side of your body until you feel a stretch in your lower  back (lumbar). Try not to let your shoulders lift off the floor. 4. Hold this position for __________ seconds. 5. Tense your abdominal muscles and slowly move your knees back to the starting position. 6. Repeat this exercise on the other side of your body.  1. Lie on your back on a firm surface with both legs straight. 2. Bend one of your knees. Use your hands to move your knee up toward your chest until you feel a gentle stretch in your lower back and buttock. ? Hold your leg in this position by holding on to the front of your knee. ? Keep your other leg as straight as possible. 3. Hold this position for __________ seconds. 4. Slowly return to the starting position. 5. Repeat with your other leg.   1. Lie on your abdomen on a firm surface (prone position). 2. Prop yourself up on your elbows. 3. Use your arms to help lift your chest up until you feel a gentle stretch in your abdomen and your lower back. ? This will place some of your body weight on your elbows. If this is uncomfortable, try stacking pillows under your chest. ? Your hips should stay down, against the surface that you are lying on. Keep your hip and back muscles relaxed. 4. Hold this  position for __________ seconds. 5. Slowly relax your upper body and return to the starting position.  These exercises build strength and endurance in your back. Endurance is the ability to use your muscles for a long time, even after they get tired. Pelvic tilt This exercise strengthens the muscles that lie deep in the abdomen. 1. Lie on your back on a firm surface. Bend your knees and keep your feet flat on the floor. 2. Tense your abdominal muscles. Tip your pelvis up toward the ceiling and flatten your lower back into the floor. ? To help with this exercise, you may place a small towel under your lower back and try to push your back into the towel. 3. Hold this position for __________ seconds. 4. Let your muscles relax completely before  you repeat this exercise.   1. Get on your hands and knees on a firm surface. If you are on a hard floor, you may want to use padding, such as an exercise mat, to cushion your knees. 2. Line up your arms and legs. Your hands should be directly below your shoulders, and your knees should be directly below your hips. 3. Lift your left leg behind you. At the same time, raise your right arm and straighten it in front of you. ? Do not lift your leg higher than your hip. ? Do not lift your arm higher than your shoulder. ? Keep your abdominal and back muscles tight. ? Keep your hips facing the ground. ? Do not arch your back. ? Keep your balance carefully, and do not hold your breath. 4. Hold this position for __________ seconds. 5. Slowly return to the starting position. 6. Repeat with your right leg and your left arm.   1. Lie on your back on a firm surface. 2. Bend one of your knees and keep your other leg straight. 3. Tense your abdominal muscles and lift your straight leg up, 4-6 inches (10-15 cm) off the ground. 4. Keep your abdominal muscles tight and hold this position for __________ seconds. ? Do not hold your breath. ? Do not arch your back. Keep it flat against the ground. 5. Keep your abdominal muscles tense as you slowly lower your leg back to the starting position. 6. Repeat with your other leg.  1. Lie on your back on a firm surface. 2. Tense your abdominal muscles and lift your feet off the floor, one foot at a time, so your knees and hips are bent in 90-degree angles (right angles). ? Your knees should be over your hips and your lower legs should be parallel to the floor. 3. Keeping your abdominal muscles tense and your knee bent, slowly lower one of your legs so your toe touches the ground. 4. Lift your leg back up to return to the starting position. ? Do not hold your breath. ? Do not let your back arch. Keep your back flat against the ground. 5. Repeat with your other  leg.  Good posture and healthy body mechanics can help to relieve stress in your body's tissues and joints. Body mechanics refers to the movements and positions of your body while you do your daily activities. Posture is part of body mechanics. Good posture means:  Your spine is in its natural S-curve position (neutral).  Your shoulders are pulled back slightly.  Your head is not tipped forward. Follow these guidelines to improve your posture and body mechanics in your everyday activities. Standing   When standing, keep your spine  neutral and your feet about hip width apart. Keep a slight bend in your knees. Your ears, shoulders, and hips should line up.  When you do a task in which you stand in one place for a long time, place one foot up on a stable object that is 2-4 inches (5-10 cm) high, such as a footstool. This helps keep your spine neutral. Sitting   When sitting, keep your spine neutral and keep your feet flat on the floor. Use a footrest, if necessary, and keep your thighs parallel to the floor. Avoid rounding your shoulders, and avoid tilting your head forward.  When working at a desk or a computer, keep your desk at a height where your hands are slightly lower than your elbows. Slide your chair under your desk so you are close enough to maintain good posture.  When working at a computer, place your monitor at a height where you are looking straight ahead and you do not have to tilt your head forward or downward to look at the screen. Resting  When lying down and resting, avoid positions that are most painful for you.  If you have pain with activities such as sitting, bending, stooping, or squatting, lie in a position in which your body does not bend very much. For example, avoid curling up on your side with your arms and knees near your chest (fetal position).  If you have pain with activities such as standing for a long time or reaching with your arms, lie with your spine  in a neutral position and bend your knees slightly. Try the following positions: ? Lying on your side with a pillow between your knees. ? Lying on your back with a pillow under your knees. Lifting   When lifting objects, keep your feet at least shoulder width apart and tighten your abdominal muscles.  Bend your knees and hips and keep your spine neutral. It is important to lift using the strength of your legs, not your back. Do not lock your knees straight out.  Always ask for help to lift heavy or awkward objects. This information is not intended to replace advice given to you by your health care provider. Make sure you discuss any questions you have with your health care provider. Document Released: 02/03/2005 Document Revised: 05/28/2018 Document Reviewed: 02/25/2018 Elsevier Patient Education  2020 [employer].

## 2019-02-13 DIAGNOSIS — M545 Low back pain, unspecified: Secondary | ICD-10-CM | POA: Insufficient documentation

## 2019-02-13 NOTE — Assessment & Plan Note (Signed)
No red flags.  Patient with 2 weeks of back pain due to heavy lifting.  Advised patient to begin lifting with his legs.  Provided with exercises and stretches that he may do at home.  In the meantime he is to use heat compresses for his pain.  Given ibuprofen helped with his pain will trial scheduled ibuprofen for 2 weeks.  He is to return if his pain does not improve or worsens.  Strict return precautions discussed, patient voiced understanding.

## 2019-04-05 ENCOUNTER — Other Ambulatory Visit: Payer: Self-pay

## 2019-04-05 ENCOUNTER — Emergency Department (HOSPITAL_COMMUNITY)
Admission: EM | Admit: 2019-04-05 | Discharge: 2019-04-05 | Disposition: A | Discharge status: Home / Self Care | Payer: No Typology Code available for payment source | Attending: Emergency Medicine | Admitting: Emergency Medicine

## 2019-04-05 ENCOUNTER — Emergency Department (HOSPITAL_COMMUNITY): Payer: No Typology Code available for payment source

## 2019-04-05 ENCOUNTER — Encounter (HOSPITAL_COMMUNITY): Payer: Self-pay

## 2019-04-05 DIAGNOSIS — M545 Low back pain, unspecified: Secondary | ICD-10-CM

## 2019-04-05 DIAGNOSIS — Z7984 Long term (current) use of oral hypoglycemic drugs: Secondary | ICD-10-CM | POA: Insufficient documentation

## 2019-04-05 DIAGNOSIS — Z79899 Other long term (current) drug therapy: Secondary | ICD-10-CM | POA: Insufficient documentation

## 2019-04-05 MED ORDER — METHOCARBAMOL 500 MG PO TABS: 500.0000 mg | ORAL_TABLET | Freq: Two times a day (BID) | ORAL | 0 refills | Status: DC

## 2019-04-05 MED ORDER — ACETAMINOPHEN 500 MG PO TABS
1000.0000 mg | ORAL_TABLET | Freq: Once | ORAL | Status: AC
  Administered 2019-04-05: 18:00:00 1000 mg via ORAL
  Filled 2019-04-05: qty 2
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
## 2019-04-05 NOTE — Discharge Instructions (Signed)
Please take Robaxin as needed for pain.  Please use Tylenol and ibuprofen as well.  Please do low back exercises that I have printed out for you to do daily basis.  Please use warm salt water soaks and heat compresses daily.

## 2019-04-12 ENCOUNTER — Encounter: Payer: Self-pay | Admitting: Family Medicine

## 2019-04-12 ENCOUNTER — Ambulatory Visit (INDEPENDENT_AMBULATORY_CARE_PROVIDER_SITE_OTHER): Payer: Self-pay | Admitting: Family Medicine

## 2019-04-12 ENCOUNTER — Other Ambulatory Visit: Payer: Self-pay

## 2019-04-12 DIAGNOSIS — R4586 Emotional lability: Secondary | ICD-10-CM | POA: Diagnosis not present

## 2019-04-12 DIAGNOSIS — M545 Low back pain, unspecified: Secondary | ICD-10-CM

## 2019-04-12 MED ORDER — DICLOFENAC SODIUM 1 % EX GEL: 2.0000 g | Freq: Four times a day (QID) | CUTANEOUS | 0 refills | Status: DC | PRN
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
## 2019-08-12 ENCOUNTER — Ambulatory Visit (INDEPENDENT_AMBULATORY_CARE_PROVIDER_SITE_OTHER): Payer: PRIVATE HEALTH INSURANCE | Admitting: Family Medicine

## 2019-08-12 ENCOUNTER — Other Ambulatory Visit: Payer: Self-pay

## 2019-08-12 VITALS — BP 122/90 | HR 78 | Ht 72.0 in | Wt 259.5 lb

## 2019-08-12 DIAGNOSIS — Z0000 Encounter for general adult medical examination without abnormal findings: Secondary | ICD-10-CM | POA: Diagnosis not present

## 2019-08-12 DIAGNOSIS — N529 Male erectile dysfunction, unspecified: Secondary | ICD-10-CM

## 2019-08-12 DIAGNOSIS — Z125 Encounter for screening for malignant neoplasm of prostate: Secondary | ICD-10-CM

## 2019-08-12 DIAGNOSIS — R7303 Prediabetes: Secondary | ICD-10-CM

## 2019-08-12 HISTORY — DX: Encounter for screening for malignant neoplasm of prostate: Z12.5

## 2019-08-12 MED ORDER — SILDENAFIL CITRATE 100 MG PO TABS: 50.0000 mg | ORAL_TABLET | Freq: Every day | ORAL | 11 refills | Status: DC | PRN

## 2019-08-12 NOTE — Assessment & Plan Note (Addendum)
Risk stratify for cardiac is using it with an A1c. -Viagra As needed

## 2019-08-12 NOTE — Progress Notes (Signed)
Patient ID: Tony Nguyen, male   DOB: 1960-03-31, 59 y.o.   MRN: [id number]  SUBJECTIVE:  Tony Nguyen is a 59 y.o. male presenting for his annual checkup.  Screening for prostate Cancer, No family history of cancer or prostate cancer.  Discussed pros and benefits of getting PSA.  Patient elects to get today.  History of ED. he has tried vigra and Cialis in the past.  Currently out of his prescription.  He would like a refill.  Is not worsening.  Discussed with relationship as a possible early sign of heart disease.  Healthy habits: Exercises 3x a week. Running  Episode of Chest pain. One time. Two weeks ago. Patient was at home, just laid down to watch TV after eating. Pain lasted 30-45 minutes. Did not take any medications. Left sided chest pain. No episode since. Never had it before. No h/o GERD. No family history of heart disease.   HLD.  Previously on atorvastatin.  Patient not taking.  Prediabetes.  Patient previously on Metformin, currently not taking.  Current Outpatient Medications  Medication Sig Dispense Refill  . sildenafil (VIAGRA) 100 MG tablet Take 0.5-1 tablets (50-100 mg total) by mouth daily as needed for erectile dysfunction. 5 tablet 11   No current facility-administered medications for this visit.   Allergies: Patient has no known allergies.   ROS:  Feeling well. No dyspnea or chest pain on exertion. No abdominal pain, change in bowel habits, black or bloody stools. No urinary tract or prostatic symptoms. No neurological complaints.  OBJECTIVE:  The patient appears well, alert, oriented x 3, in no distress.  BP 122/90   Pulse 78   Ht 6' (1.829 m)   Wt 259 lb 8 oz (117.7 kg)   SpO2 99%   BMI 35.19 kg/m  ENT normal.  Neck supple. No adenopathy or thyromegaly. PERLA. Lungs are clear, good air entry, no wheezes, rhonchi or rales. S1 and S2 normal, no murmurs, regular rate and rhythm. Abdomen is soft without tenderness, guarding, mass or  organomegaly. Extremities show no edema, normal peripheral pulses. Neurological is normal without focal findings.  ASSESSMENT:  healthy adult male  PLAN:   Encouraged patient continue exercising and to pursue healthy diet.  Problem List Items Addressed This Visit      Other   Prediabetes    We will recheck A1c.  And BMP to check renal function.      Relevant Orders   HgB A1c   Erectile dysfunction    Risk stratify for cardiac is using it with an A1c. -Viagra As needed      Relevant Medications   sildenafil (VIAGRA) 100 MG tablet   Screening for prostate cancer    Discussed pros and benefits of testing.  Patient elected to get through shared decision making.       Other Visit Diagnoses    Routine general medical examination at a health care facility    -  Primary   Relevant Orders   Basic metabolic panel   Lipid panel   PSA

## 2019-08-12 NOTE — Patient Instructions (Signed)

## 2019-08-12 NOTE — Assessment & Plan Note (Addendum)
We will recheck A1c.  And BMP to check renal function.

## 2019-08-12 NOTE — Assessment & Plan Note (Signed)
Discussed pros and benefits of testing.  Patient elected to get through shared decision making.

## 2019-08-13 LAB — LIPID PANEL
Chol/HDL Ratio: 4.5 ratio (ref 0.0–5.0)
Cholesterol, Total: 214 mg/dL — ABNORMAL HIGH (ref 100–199)
HDL: 48 mg/dL (ref >39)
LDL Chol Calc (NIH): 154 mg/dL — ABNORMAL HIGH (ref 0–99)
Triglycerides: 67 mg/dL (ref 0–149)
VLDL Cholesterol Cal: 12 mg/dL (ref 5–40)

## 2019-08-13 LAB — BASIC METABOLIC PANEL
BUN/Creatinine Ratio: 13 (ref 9–20)
BUN: 15 mg/dL (ref 6–24)
CO2: 25 mmol/L (ref 20–29)
Calcium: 9.7 mg/dL (ref 8.7–10.2)
Chloride: 103 mmol/L (ref 96–106)
Creatinine, Ser: 1.12 mg/dL (ref 0.76–1.27)
GFR calc Af Amer: 84 mL/min/{1.73_m2} (ref >59)
GFR calc non Af Amer: 73 mL/min/{1.73_m2} (ref >59)
Glucose: 108 mg/dL — ABNORMAL HIGH (ref 65–99)
Potassium: 4.6 mmol/L (ref 3.5–5.2)
Sodium: 138 mmol/L (ref 134–144)

## 2019-08-13 LAB — PSA: Prostate Specific Ag, Serum: 1.1 ng/mL (ref 0.0–4.0)

## 2019-08-15 ENCOUNTER — Encounter: Payer: Self-pay | Admitting: Family Medicine

## 2019-10-20 ENCOUNTER — Other Ambulatory Visit: Payer: Self-pay

## 2019-10-20 ENCOUNTER — Ambulatory Visit (INDEPENDENT_AMBULATORY_CARE_PROVIDER_SITE_OTHER): Payer: Self-pay | Admitting: Family Medicine

## 2019-10-20 VITALS — BP 128/78 | HR 81 | Ht 72.0 in | Wt 259.5 lb

## 2019-10-20 DIAGNOSIS — Z0130 Encounter for examination of blood pressure without abnormal findings: Secondary | ICD-10-CM

## 2019-10-20 NOTE — Assessment & Plan Note (Signed)
Patient presented today for blood pressure check.  Was normotensive at 128/78. -Follow-up as needed

## 2019-10-20 NOTE — Progress Notes (Signed)
    SUBJECTIVE:   CHIEF COMPLAINT / HPI:   Blood pressure check: This is a pleasant 59 year old male presenting to clinic today for a blood pressure check.  Patient reports that he is in the process of being screened to donate a kidney to a relative of his who is currently ESRD on dialysis.  Patient is otherwise being worked up and screened in [place], [place], as to whether not he is a candidate to donate a kidney.  Patient's blood pressure at today's check 128/78.  PERTINENT  PMH / PSH:  Patient Active Problem List   Diagnosis Date Noted  . Blood pressure check 10/20/2019  . Mood changes 04/12/2019  . Acute midline low back pain without sciatica 02/13/2019  . Erectile dysfunction 07/23/2018  . Ganglion cyst of dorsum of left wrist 04/20/2018  . Prediabetes 04/17/2016  . OBESITY, NOS 04/16/2006    OBJECTIVE:   BP 128/78   Pulse 81   Ht 6' (1.829 m)   Wt 259 lb 8 oz (117.7 kg)   SpO2 98%   BMI 35.19 kg/m    Physical exam: General: Nontoxic-appearing, no apparent distress Respiratory: CTA bilaterally, comfortable work of breathing Cardio: RRR, S1-S2 present, no murmurs appreciated Abdomen: Soft, nontender, normal bowel sounds appreciated   ASSESSMENT/PLAN:   Blood pressure check Patient presented today for blood pressure check.  Was normotensive at 128/78. -Follow-up as needed     Dollene Cleveland, DO [facility]

## 2019-11-22 ENCOUNTER — Encounter: Payer: Self-pay | Admitting: Family Medicine

## 2019-11-22 ENCOUNTER — Ambulatory Visit (INDEPENDENT_AMBULATORY_CARE_PROVIDER_SITE_OTHER): Payer: Self-pay | Admitting: Family Medicine

## 2019-11-22 ENCOUNTER — Other Ambulatory Visit: Payer: Self-pay

## 2019-11-22 VITALS — BP 118/86 | HR 86 | Ht 72.0 in | Wt 258.0 lb

## 2019-11-22 DIAGNOSIS — R7303 Prediabetes: Secondary | ICD-10-CM

## 2019-11-22 LAB — POCT GLYCOSYLATED HEMOGLOBIN (HGB A1C): HbA1c, POC (controlled diabetic range): 5.9 % (ref 0.0–7.0)

## 2019-11-22 LAB — GLUCOSE, POCT (MANUAL RESULT ENTRY): POC Glucose: 108 mg/dl — AB (ref 70–99)

## 2019-11-22 NOTE — Progress Notes (Signed)
   SUBJECTIVE:   CHIEF COMPLAINT / HPI:   Chief Complaint  Patient presents with  . results from [facility] Tony Nguyen is a 59 y.o. male here for lab test abnormalities.  Patient attempted to donate kidney to his male cousin. He underwent some tests and was told to follow up with his PCP. Patient with abnormal 1 hour GTT but normal 2 hour GTT.      PERTINENT  PMH / PSH: reviewed and updated as appropriate   OBJECTIVE:   BP 118/86   Pulse 86   Ht 6' (1.829 m)   Wt 258 lb (117 kg)   SpO2 98%   BMI 34.99 kg/m    GEN: we;; developed male, in no acute distress  CV: regular rate and rhythm, no murmurs appreciated  RESP: no increased work of breathing, clear to ascultation bilaterally  ABD: Soft, Nontender, Nondistended.  MSK: no lower extremity edema, normal ROM SKIN: warm, dry NEURO: grossly normal, moves all extremities appropriately PSYCH: Normal affect, appropriate speech and behavior    ASSESSMENT/PLAN:   Prediabetes A1c 5.9 from 6.0 previously. Random CBG 108. Encouraged continued diet rich in vegetables and complex carbs.  Heart healthy carb modified diet. Counseled on need to continue exercising. - pt to discuss with cousin's nephrologist criteria for kidney transplant  - Pt should be counseled about potential damage to his kidney if he develops diabetes in the future.   Follow up in 3 months.      Katha Cabal, DO PGY-2, [facility] 11/22/2019

## 2019-11-22 NOTE — Patient Instructions (Signed)
  It was great seeing you today!  Please check-out at the front desk before leaving the clinic. I'd like to see you back in 3 months but if you need to be seen earlier than that for any new issues we're happy to fit you in, just give [place] a call!  Visit Remembers: - Continue to work on your healthy eating habits and incorporating exercise into your daily life. (see below) - Your goal is to have an A1c <5.6     Diet Recommendations for Diabetes  Carbohydrate includes starch, sugar, and fiber.  Of these, only sugar and starch raise blood glucose.  (Fiber is found in fruits, vegetables [especially skin, seeds, and stalks] and whole grains.)   Starchy (carb) foods: Bread, rice, pasta, potatoes, corn, cereal, grits, crackers, bagels, muffins, all baked goods.  (Fruit, milk, and yogurt also have carbohydrate, but most of these foods will not spike your blood sugar as most starchy foods will.)  A few fruits do cause high blood sugars; use small portions of bananas (limit to 1/2 at a time), grapes, watermelon, oranges, and most tropical fruits.   Protein foods: Meat, fish, poultry, eggs, dairy foods, and beans such as pinto and kidney beans (beans also provide carbohydrate).   1. Eat at least REAL 3 meals and 1-2 snacks per day. Never go more than 4-5 hours while awake without eating. Eat breakfast within the first hour of getting up.   2. Limit starchy foods to TWO per meal and ONE per snack. ONE portion of a starchy food is equal to the following:   - ONE slice of bread (or its equivalent, such as half of a hamburger bun).   - 1/2 cup of a "scoopable" starchy food such as potatoes or rice.   - 15 grams of Total Carbohydrate as shown on food label.   - Every 4 ounces of a sweet drink (including fruit juice). 3. Include at every meal: a protein food, a carb food, and vegetables and/or fruit.   - Obtain twice the volume of veg's as protein or carbohydrate foods for both lunch and dinner.   - Fresh or  frozen veg's are best.   - Keep frozen veg's on hand for a quick vegetable serving.       Regarding lab work today:  Due to recent changes in healthcare laws, you may see the results of your imaging and laboratory studies on MyChart before your doctor has had a chance to review them.  We understand that in some cases there may be results that are confusing or concerning to you. Not all laboratory results come back in the same time frame and your doctor may be waiting for multiple results in order to interpret others.  Please give [place] 72 hours in order for your doctor to thoroughly review all the results before contacting the office for clarification of your results. If everything is normal, you will get a letter in the mail or a message in My Chart. Please give [place] a call if you do not hear from [place] after 2 weeks.  Please bring all of your medications with you to each visit.    If you haven't already, sign up for My Chart to have easy access to your labs results, and communication with your primary care physician.  Feel free to call with any questions or concerns at any time, at [phone].   Take care,  Dr. Katherina [facility]

## 2019-11-28 NOTE — Assessment & Plan Note (Addendum)
A1c 5.9 from 6.0 previously. Random CBG 108. Encouraged continued diet rich in vegetables and complex carbs.  Heart healthy carb modified diet. Counseled on need to continue exercising. - pt to discuss with cousin's nephrologist criteria for kidney transplant  - Pt should be counseled about potential damage to his kidney if he develops diabetes in the future.   Follow up in 3 months.

## 2020-01-11 ENCOUNTER — Other Ambulatory Visit: Payer: Self-pay

## 2020-01-11 ENCOUNTER — Ambulatory Visit (INDEPENDENT_AMBULATORY_CARE_PROVIDER_SITE_OTHER): Payer: Self-pay | Admitting: Family Medicine

## 2020-01-11 VITALS — BP 112/78 | HR 82 | Wt 255.8 lb

## 2020-01-11 DIAGNOSIS — M25512 Pain in left shoulder: Secondary | ICD-10-CM

## 2020-01-11 DIAGNOSIS — M25511 Pain in right shoulder: Secondary | ICD-10-CM | POA: Insufficient documentation

## 2020-01-11 MED ORDER — NAPROXEN 500 MG PO TABS: 500.0000 mg | ORAL_TABLET | Freq: Two times a day (BID) | ORAL | 0 refills | Status: DC

## 2020-01-11 NOTE — Assessment & Plan Note (Addendum)
Acute pain, no specific injury.  He does have a history of significant amount of lifting with his job.  Suspect a supraspinatus tear partial versus complete given his examination.  He does not seem to have impingement symptoms, therefore would likely not benefit from corticosteroid injection.  We will trial him on naproxen 500 mg twice daily for 2 weeks, will also try to get him in with sports medicine that is so that he can have a formal ultrasound, see how he does with naproxen, then can consider MRI based on this.  He would likely also benefit from PT in the future, but would hold off in this acute.  At this time given his significant pain.  He does not have an appointment with sports medicine within the next 2 weeks, he should follow-up in 2 weeks here.  Advised to take naproxen with food and to avoid other NSAIDs while using.

## 2020-01-11 NOTE — Patient Instructions (Signed)
Thank you for coming to see me today. It was a pleasure. Today we talked about:   You likely have a tear of your rotator cuff.  We will refer you to the sports medicine office for further evaluation with possible ultrasound.  We can see how you do over the next few weeks.  I will have you take naproxen twice daily with meals for your pain.  Avoid heavy lifting as well.  I will write you a note for work.  If you do not hear from the sports medicine office next week, please give [place] a call.  Please follow-up with me or PCP in 2 weeks if you do not have a sports medicine appointment.  If you have any questions or concerns, please do not hesitate to call the office at [phone].  Best,   Luis Abed, DO

## 2020-01-11 NOTE — Progress Notes (Signed)
    SUBJECTIVE:   CHIEF COMPLAINT / HPI:   Left shoulder pain Acutely started 1 week ago, no particular injury Lifts a lot at work Only has pain hen trying to lift his left arm No N/T, neck pain, or elbow pain No swelling or redness No known injury, history of prior injury, history of prior surgery Reports that the pain feels deep inside the front of his shoulder, but he cannot make it hurt when he pushes on it He has only tried icy hot without improvement Otherwise feeling well, no fevers No prior imaging  PERTINENT  PMH / PSH: Pre-diabetes, erectile dysfunction, hx back pain, obesity  OBJECTIVE:   BP 112/78   Pulse 82   Wt 255 lb 12.8 oz (116 kg)   SpO2 99%   BMI 34.69 kg/m    Physical exam: General: In NAD  Left Shoulder: Inspection reveals no obvious deformity, atrophy, or asymmetry. No bruising. No swelling b/l Palpation is normal with no TTP over [facility] joint or bicipital groove b/l Full ROM in flexion, abduction, internal/external rotation on right, full ROM on left in all fields aside from abduction which is limited to 40 degrees actively, passively 180, external rotation also slightly decreased NV intact distally b/l Special Tests:  - Impingement: Neg Hawkins b/l - Supraspinatous: Positive empty can on left.  - Infraspinatous/Teres Minor: 5/5 strength with ER b/l - Subscapularis: 5/5 strength with IR b/l - Biceps tendon: Negative Speeds b/l - Labrum: Negative Obriens b/l - AC Joint: Positive cross arm on left -Painful arc on the left    ASSESSMENT/PLAN:   Acute pain of left shoulder Acute pain, no specific injury.  He does have a history of significant amount of lifting with his job.  Suspect a supraspinatus tear partial versus complete given his examination.  He does not seem to have impingement symptoms, therefore would likely not benefit from corticosteroid injection.  We will trial him on naproxen 500 mg twice daily for 2 weeks, will also try to get him in  with sports medicine that is so that he can have a formal ultrasound, see how he does with naproxen, then can consider MRI based on this.  He would likely also benefit from PT in the future, but would hold off in this acute.  At this time given his significant pain.  He does not have an appointment with sports medicine within the next 2 weeks, he should follow-up in 2 weeks here.  Advised to take naproxen with food and to avoid other NSAIDs while using.     Unknown Jim, DO [facility]

## 2020-01-18 ENCOUNTER — Ambulatory Visit: Payer: Self-pay | Admitting: Family Medicine

## 2020-01-19 ENCOUNTER — Other Ambulatory Visit: Payer: Self-pay

## 2020-01-19 ENCOUNTER — Ambulatory Visit (INDEPENDENT_AMBULATORY_CARE_PROVIDER_SITE_OTHER): Payer: No Typology Code available for payment source | Admitting: Sports Medicine

## 2020-01-19 VITALS — BP 126/82 | Ht 72.0 in | Wt 240.0 lb

## 2020-01-19 DIAGNOSIS — M25512 Pain in left shoulder: Secondary | ICD-10-CM | POA: Diagnosis not present

## 2020-01-19 MED ORDER — METHYLPREDNISOLONE ACETATE 40 MG/ML IJ SUSP
40.0000 mg | Freq: Once | INTRAMUSCULAR | Status: AC
  Administered 2020-01-19: 40 mg via INTRA_ARTICULAR

## 2020-01-19 NOTE — Progress Notes (Signed)
PCP: Katha Cabal, DO  Subjective:   HPI: Patient is a 59 y.o. male here for acute left shoulder pain.  Patient was seen and family medicine clinic on 11/24 for shoulder pain which had been going on for a week.  He was referred for sports medicine.  His pain has continued since that time.  Patient reports that he works at [employer] and has to lift heavy boxes of labels.  He is able to perform his job but the pain is getting worse and so he wants to make sure nothing is seriously wrong.  Denies any injury to the shoulder.  Reports that it hurts him when he lays down at night but that he is still able to sleep.  He was prescribed naproxen at the family medicine center and that seems to help a little bit.  He also reports that when he showers the warm water helps with the pain.  Past Medical History:  Diagnosis Date  . Bug bite 08/09/2018  . Duodenal ulcer due to Helicobacter pylori 09/25/2010   EGD confirmed that duodenal ulcers and H. pylori gastritis in 08/2009. Treated with triple  therapy at [facility].   Marland Kitchen Gallbladder polyp 09/25/2010  . Major depression in remission (HCC)   . OTHER DISEASES OF NASAL CAVITY AND SINUSES 02/23/2008   Qualifier: Diagnosis of  By: Daphine Deutscher MD, Corrie Dandy    . RHINITIS, CHRONIC 04/16/2006   Qualifier: Diagnosis of  By: Bebe Shaggy    . Screening for prostate cancer 08/12/2019  . TENNIS ELBOW 04/16/2006   Qualifier: Diagnosis of  By: Bebe Shaggy    . Tinea corporis 06/01/2017    Current Outpatient Medications on File Prior to Visit  Medication Sig Dispense Refill  . naproxen (NAPROSYN) 500 MG tablet Take 1 tablet (500 mg total) by mouth 2 (two) times daily with a meal. 30 tablet 0  . sildenafil (VIAGRA) 100 MG tablet Take 0.5-1 tablets (50-100 mg total) by mouth daily as needed for erectile dysfunction. 5 tablet 11   No current facility-administered medications on file prior to visit.    Past Surgical History:  Procedure Laterality Date  .  APPENDECTOMY  1981  . CHOLECYSTECTOMY  10/15/10   Path results revealed chronic cholecystitis  . ELBOW SURGERY  2005   right  . UPPER GASTROINTESTINAL ENDOSCOPY      No Known Allergies  Social History   Socioeconomic History  . Marital status: Married    Spouse name: Not on file  . Number of children: 2  . Years of education: Not on file  . Highest education level: Not on file  Occupational History  . 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 Smoking status: Never Smoker  . Smokeless tobacco: Never Used  Vaping Use  . Vaping Use: Never used  Substance and Sexual Activity  . Alcohol use: No  . Drug use: No  . Sexual activity: Yes  Other Topics Concern  . Not on file  Social History Narrative   Patient is originally from [place].   Past 2 daughters age 52 and 75.   Undergoing a separation from his wife.      Works out regularly with running and weightlifting.   Social Determinants of Health   Financial Resource Strain:   . Difficulty of Paying Living Expenses: Not on file  Food Insecurity:   . Worried About Programme researcher, broadcasting/film/video in the Last  Year: Not on file  . Ran Out of Food in the Last Year: Not on file  Transportation Needs:   . Lack of Transportation (Medical): Not on file  . Lack of Transportation (Non-Medical): Not on file  Physical Activity:   . Days of Exercise per Week: Not on file  . Minutes of Exercise per Session: Not on file  Stress:   . Feeling of Stress : Not on file  Social Connections:   . Frequency of Communication with Friends and Family: Not on file  . Frequency of Social Gatherings with Friends and Family: Not on file  . Attends Religious Services: Not on file  . Active Member of Clubs or Organizations: Not on file  . Attends Banker Meetings: Not on file  . Marital Status: Not on file  Intimate Partner Violence:   . Fear of Current or Ex-Partner: Not on file  . Emotionally  Abused: Not on file  . Physically Abused: Not on file  . Sexually Abused: Not on file    Family History  Problem Relation Age of Onset  . High blood pressure Father   . Diabetes Neg Hx   . Early death Neg Hx   . Heart disease Neg Hx   . Hyperlipidemia Neg Hx   . Hypertension Neg Hx     BP 126/82   Ht 6' (1.829 m)   Wt 240 lb (108.9 kg)   BMI 32.55 kg/m   No flowsheet data found.  No flowsheet data found.  Review of Systems: See HPI above.     Objective:  Physical Exam:  Gen: NAD, comfortable in exam room  No swelling, ecchymoses.  No gross deformity. Tenderness to palpation along the anterior glenohumeral joint Decreased range of motion due to pain with abduction and internal rotation Positive Neers. Strength 5/5 resisted internal/external rotation. Negative apprehension. NV intact distally.    Assessment & Plan:  Left shoulder pain Persistent left shoulder pain over the last 2 weeks.  No identifiable injury to the left shoulder.  Pain with abduction and internal rotation.  No weakness noted no paresthesia noted.  No edema or erythema noted in the shoulder.  Physical exam and history consistent with tendinitis versus bursitis.  Discussed options of treatment and patient elected for corticosteroid injection of the shoulder. -Depo-Medrol injection in the shoulder given -Recommended continuation of naproxen as needed for pain -Can continue with ice or heat as needed -Follow-up in 2 weeks for further evaluation and management  Consent obtained and verified. Time-out conducted. Noted no overlying erythema, induration, or other signs of local infection. Skin prepped in a sterile fashion. Topical analgesic spray: Ethyl chloride. Joint: left shoulder (subacromial) Needle: 25g 1.5 inch Completed without difficulty. Meds: 3cc 1% xylocaine, 1cc (40mg ) depomedrol  Advised to call if fevers/chills, erythema, induration, drainage, or persistent bleeding.  Patient seen  and evaluated with the resident.  I agree with the above plan of care.  Cortisone injection administered as above.  Patient will follow up with me again in 2 weeks for reevaluation.  He has good rotator cuff strength on exam today so suspicion for a full-thickness rotator cuff tear is low.  However, if he continues to have symptoms of follow-up I will start with an ultrasound of the shoulder.  If he notices improvement, we will educate him in a home exercise program.

## 2020-02-07 ENCOUNTER — Other Ambulatory Visit: Payer: Self-pay

## 2020-02-07 ENCOUNTER — Ambulatory Visit (INDEPENDENT_AMBULATORY_CARE_PROVIDER_SITE_OTHER): Payer: No Typology Code available for payment source | Admitting: Sports Medicine

## 2020-02-07 VITALS — BP 128/82 | Ht 72.0 in | Wt 240.0 lb

## 2020-02-07 DIAGNOSIS — M7542 Impingement syndrome of left shoulder: Secondary | ICD-10-CM

## 2020-02-07 DIAGNOSIS — M25512 Pain in left shoulder: Secondary | ICD-10-CM

## 2020-02-07 NOTE — Progress Notes (Signed)
   Subjective:    Patient ID: Tony Nguyen, male    DOB: 10/27/1960, 59 y.o.   MRN: [id number]  HPI   Patient comes in today for follow-up on left shoulder pain.  Subacromial cortisone injection has helped some but has not been curative.  His pain is not constant.  He gets intermittent lateral shoulder pain when reaching away from his body.  Pain last 15 to 20 seconds.  He has not noticed any weakness.    Review of Systems   As above Objective:   Physical Exam  Well-developed, well-nourished.  No acute distress  Left shoulder: Full range of motion.  No tenderness to palpation over the acromioclavicular joint.  No tenderness over the bicipital groove.  Mildly positive empty can, markedly positive Hawkins.  Rotator cuff strength is 5/5 and does not reproduce pain.  Equivocal O'Brien's.  Neurovascularly intact distally.       Assessment & Plan:   Left shoulder pain likely secondary to rotator cuff impingement  I think the patient would benefit greatly from formal physical therapy.  We will go ahead and show him a home exercise program to get him started while he waits on his first visit.  Follow-up with me again in 1 month.  If symptoms persist, consider imaging at that time.

## 2020-03-06 ENCOUNTER — Ambulatory Visit: Payer: No Typology Code available for payment source | Admitting: Sports Medicine

## 2020-06-27 ENCOUNTER — Other Ambulatory Visit: Payer: Self-pay

## 2020-06-27 ENCOUNTER — Encounter: Payer: Self-pay | Admitting: Family Medicine

## 2020-06-27 ENCOUNTER — Ambulatory Visit (INDEPENDENT_AMBULATORY_CARE_PROVIDER_SITE_OTHER): Payer: No Typology Code available for payment source | Admitting: Family Medicine

## 2020-06-27 VITALS — BP 130/70 | HR 89 | Ht 72.0 in | Wt 268.2 lb

## 2020-06-27 DIAGNOSIS — Z0000 Encounter for general adult medical examination without abnormal findings: Secondary | ICD-10-CM | POA: Diagnosis not present

## 2020-06-27 DIAGNOSIS — Z113 Encounter for screening for infections with a predominantly sexual mode of transmission: Secondary | ICD-10-CM

## 2020-06-27 DIAGNOSIS — Z114 Encounter for screening for human immunodeficiency virus [HIV]: Secondary | ICD-10-CM

## 2020-06-27 DIAGNOSIS — R7303 Prediabetes: Secondary | ICD-10-CM | POA: Diagnosis not present

## 2020-06-27 DIAGNOSIS — Z1159 Encounter for screening for other viral diseases: Secondary | ICD-10-CM

## 2020-06-27 LAB — POCT GLYCOSYLATED HEMOGLOBIN (HGB A1C): HbA1c, POC (controlled diabetic range): 6.3 % (ref 0.0–7.0)

## 2020-06-27 NOTE — Progress Notes (Signed)
    SUBJECTIVE:   Chief compliant/HPI: annual examination  Tony Nguyen is a 60 y.o. who presents today for an annual exam.  History tabs reviewed and updated   Review of systems form reviewed.  OBJECTIVE:   BP 130/70   Pulse 89   Ht 6' (1.829 m)   Wt 268 lb 4 oz (121.7 kg)   SpO2 98%   BMI 36.38 kg/m   GEN:     alert, well appearing no distress  HENT:  mucus membranes moist, oropharyngeal without lesions or erythema,  nares patent, no nasal discharge EYES:   pupils equal and reactive NECK:  supple, normal ROM, no lymphadenopathy  RESP:  clear to auscultation bilaterally, no increased work of breathing CVS:   regular rate and rhythm, no murmur, distal pulses intact  ABD:  soft, non-tender; bowel sounds present; non-distended, no CVA tenderness EXT:   normal ROM, back: no C,T or L spine midline or paraspinal tenderness.  NEURO:  CN 2-12 grossly intact, strength 5/5 bilateral upper and lower extremities, speech normal, alert and oriented  Skin:   warm and dry    ASSESSMENT/PLAN:   No problem-specific Assessment & Plan notes found for this encounter.    Annual Examination  See AVS for age appropriate recommendations.  PHQ score 0, reviewed and discussed.  Blood pressure value is 130/70 at goal, discussed.   Considered the following screening exams based upon USPSTF recommendations: Diabetes screening: discussed and ordered Screening for elevated cholesterol: discussed and ordered HIV testing: discussed and ordered Hepatitis C: discussed and ordered Hepatitis B: discussed Syphilis if at high risk: discussed and ordered Reviewed risk factors for latent tuberculosis and not indicated Colorectal cancer screening: up to date on screening for CRC. Open to colonoscopy Lung cancer screening: not indicated  PSA discussed and after engaging in discussion of possible risks, benefits and complications of screening patient elected to not to screen at this time.    Follow up in 1 year or sooner if indicated.    Katha Cabal, DO [facility]

## 2020-06-27 NOTE — Patient Instructions (Signed)
Today at your annual preventive visit we talked about the following measures:  I recommend 150 minutes of exercise per week-try 30 minutes 5 days per week We discussed reducing sugary beverages (like soda and juice) and increasing leafy greens and whole fruits.  We discussed avoiding tobacco and alcohol.  I recommend avoiding illicit substances.  Your blood pressure is 130/70 at goal of <120/80 It was great seeing you today!   Regarding lab work today:  Due to recent changes in healthcare laws, you may see the results of your imaging and laboratory studies on MyChart before your provider has had a chance to review them.  I understand that in some cases there may be results that are confusing or concerning to you. Not all laboratory results come back in the same time frame and you may be waiting for multiple results in order to interpret others.  Please give [place] 72 hours in order for your provider to thoroughly review all the results before contacting the office for clarification of your results. If everything is normal, you will get a letter in the mail or a message in My Chart. Please give [place] a call if you do not hear from [place] after 2 weeks.  Please bring all of your medications with you to each visit.    If you haven't already, sign up for My Chart to have easy access to your labs results, and communication with your primary care physician.  Feel free to call with any questions or concerns at any time, at [phone].   Take care,  Dr. Katherina [facility]

## 2020-06-29 ENCOUNTER — Encounter: Payer: Self-pay | Admitting: Family Medicine

## 2020-07-02 LAB — LIPID PANEL
Chol/HDL Ratio: 5 ratio (ref 0.0–5.0)
Cholesterol, Total: 190 mg/dL (ref 100–199)
HDL: 38 mg/dL — ABNORMAL LOW (ref >39)
LDL Chol Calc (NIH): 132 mg/dL — ABNORMAL HIGH (ref 0–99)
Triglycerides: 107 mg/dL (ref 0–149)
VLDL Cholesterol Cal: 20 mg/dL (ref 5–40)

## 2020-07-02 LAB — BASIC METABOLIC PANEL
BUN/Creatinine Ratio: 15 (ref 9–20)
BUN: 19 mg/dL (ref 6–24)
CO2: 25 mmol/L (ref 20–29)
Calcium: 9.5 mg/dL (ref 8.7–10.2)
Chloride: 103 mmol/L (ref 96–106)
Creatinine, Ser: 1.26 mg/dL (ref 0.76–1.27)
Glucose: 91 mg/dL (ref 65–99)
Potassium: 4.2 mmol/L (ref 3.5–5.2)
Sodium: 139 mmol/L (ref 134–144)
eGFR: 66 mL/min/{1.73_m2} (ref >59)

## 2020-07-02 LAB — HIV ANTIBODY (ROUTINE TESTING W REFLEX): HIV Screen 4th Generation wRfx: NONREACTIVE

## 2020-07-02 LAB — RPR: RPR Ser Ql: NONREACTIVE

## 2020-07-02 LAB — HEPATITIS C ANTIBODY: Hep C Virus Ab: <0.1 s/co ratio (ref 0.0–0.9)

## 2020-08-24 ENCOUNTER — Encounter: Payer: Self-pay | Admitting: Family Medicine

## 2020-08-24 ENCOUNTER — Other Ambulatory Visit: Payer: Self-pay

## 2020-08-24 ENCOUNTER — Ambulatory Visit (INDEPENDENT_AMBULATORY_CARE_PROVIDER_SITE_OTHER): Payer: No Typology Code available for payment source | Admitting: Family Medicine

## 2020-08-24 VITALS — BP 130/80 | HR 85 | Ht 72.0 in | Wt 263.8 lb

## 2020-08-24 DIAGNOSIS — M545 Low back pain, unspecified: Secondary | ICD-10-CM | POA: Diagnosis not present

## 2020-08-24 DIAGNOSIS — M791 Myalgia, unspecified site: Secondary | ICD-10-CM | POA: Diagnosis not present

## 2020-08-24 MED ORDER — DICLOFENAC SODIUM 3 % EX GEL: 1.0000 "application " | Freq: Four times a day (QID) | CUTANEOUS | 0 refills | Status: DC | PRN

## 2020-08-24 NOTE — Progress Notes (Signed)
   SUBJECTIVE:   CHIEF COMPLAINT / HPI:   Chief Complaint  Patient presents with   Shoulder Pain   Thigh pain     Tony Nguyen is a 60 y.o. male here for bilateral thigh pain that started 2 weeks ago. Has crampy sharp pain that feels like it goes to the bone. Has bilateral shoulder pain but pain goes away as soon as he moves. Tried nothing for pain. Has been exercising more recently. Has been lifting more than usual at work.     PERTINENT  PMH / PSH: reviewed and updated as appropriate   OBJECTIVE:   BP 130/80   Pulse 85   Ht 6' (1.829 m)   Wt 263 lb 12.8 oz (119.7 kg)   SpO2 98%   BMI 35.78 kg/m    GEN: well appearing male, in no acute distress  CV: regular rate and rhythm, no murmur RESP: no increased work of breathing, clear to ascultation bilaterally MSK: no  overlying skin changes, no bony tenderness, + Neers sign, strength 5/5 bilateral left and right shoulder, thigh musculature non-tender to palpation  SKIN: warm, dry NEURO: grossly normal, moves all extremities appropriately PSYCH: Normal affect, appropriate speech and behavior    ASSESSMENT/PLAN:   No problem-specific Assessment & Plan notes found for this encounter.  Muscular pain Thigh pain likely strain due to heavy lifting. Doubt polymyalgia rheumatica, fracture, mass. Start diclofenac gel. Follow up in 2 weeks if not improving.   Lumbosacral radiculopathy  Voltaren gel as above. Continue Tylenol as needed. Reviewed proper body mechanics for lifting. Advised pt to only lift the recommended weight limit.    Katha Cabal, DO PGY-2, [facility] 08/24/2020

## 2020-08-24 NOTE — Patient Instructions (Addendum)
It was great seeing you today! Your leg pain is likely due to lifting heavy things at work.   As discusssed, apply Diclofenac gel up to 4 times a day for thigh pain.    I'd like to see you back 2 weeks if your pain does not improve but if you need to be seen earlier than that for any new issues we're happy to fit you in, just give [place] a call!    Take care,   [facility]

## 2020-08-27 ENCOUNTER — Other Ambulatory Visit: Payer: Self-pay

## 2020-08-27 DIAGNOSIS — N529 Male erectile dysfunction, unspecified: Secondary | ICD-10-CM

## 2020-08-27 MED ORDER — SILDENAFIL CITRATE 100 MG PO TABS: 50.0000 mg | ORAL_TABLET | Freq: Every day | ORAL | 11 refills | Status: AC | PRN

## 2020-09-10 ENCOUNTER — Other Ambulatory Visit: Payer: Self-pay

## 2020-09-10 ENCOUNTER — Encounter: Payer: Self-pay | Admitting: Family Medicine

## 2020-09-10 ENCOUNTER — Ambulatory Visit (INDEPENDENT_AMBULATORY_CARE_PROVIDER_SITE_OTHER): Payer: No Typology Code available for payment source | Admitting: Family Medicine

## 2020-09-10 VITALS — BP 120/60 | HR 90 | Ht 72.0 in | Wt 258.5 lb

## 2020-09-10 DIAGNOSIS — M25511 Pain in right shoulder: Secondary | ICD-10-CM | POA: Diagnosis not present

## 2020-09-10 MED ORDER — NAPROXEN 500 MG PO TABS: 500.0000 mg | ORAL_TABLET | Freq: Two times a day (BID) | ORAL | 0 refills | Status: DC

## 2020-09-10 NOTE — Progress Notes (Signed)
ongo  SUBJECTIVE:   CHIEF COMPLAINT / HPI:   Chief Complaint  Patient presents with   Follow-up     Tony Nguyen is a 60 y.o. male here for ongoing right shoulder pain. Pain described as throbbing and rated 9/10. Took tylenol and Vicks vapor rub with some relief. Did not pick up medication from previous visit as it was too expensive. When he is working, he does not feel any pain. If he is sitting/resting he feels pain in his shoulder. He lifts heavy boxes at [employer].   PERTINENT  PMH / PSH: reviewed and updated as appropriate   OBJECTIVE:   BP 120/60   Pulse 90   Ht 6' (1.829 m)   Wt 258 lb 8 oz (117.3 kg)   SpO2 98%   BMI 35.06 kg/m    GEN: well appearing male in no acute distress  CVS: well perfused  RESP: speaking in full sentences without pause, no respiratory distress  MSK: Right shoulder:  No evidence of bony deformity, asymmetry, or muscle atrophy. Tenderness over long head of biceps (bicipital groove).  TTP at [facility] joint.  Full active and passive (ABD, ADD, Flexion, extension, IR, ER). Pain with adduction and extension.  Strength 5/5 grip, 4/5 elbow and 4/5 shoulder. No abnormal scapular function observed.  Special Tests: Hawkins: Positive; Neer's: Negative; Painful arc: Positive; Anterior Apprehension: Negative, Lift off test: Positive Sensation intact. Peripheral pulses intact.   ASSESSMENT/PLAN:   Acute pain of right shoulder Acute pain. Shoulder exam abnormal. High concern for rotator cuff tear. No formal xrays performed. Obtain shoulder xray. Naprosyn BID for 14 days. Discussed referral to sports medicine for formal shoulder ultrasound. Pt agreeable. Referral placed. 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      Katha Cabal, DO PGY-3, [facility] 09/10/2020

## 2020-09-10 NOTE — Patient Instructions (Signed)
It was great seeing you today! Your shoulder pain may be related to arthritis however I am concerned that your have weakness in your shoulder.   As discusssed, stop by the pharmacy to pick up your pain medication. Be sure to follow up with the sports medicine clinic and get your shoulder ultrasound.     I'd like to see you back 1 month if your pain does not improve but if you need to be seen earlier than that for any new issues we're happy to fit you in, just give [place] a call!    Take care,   [facility]

## 2020-09-12 NOTE — Assessment & Plan Note (Signed)
Acute pain. Shoulder exam abnormal. High concern for rotator cuff tear. No formal xrays performed. Obtain shoulder xray. Naprosyn BID for 14 days. Discussed referral to sports medicine for formal shoulder ultrasound. Pt agreeable. Referral placed. 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

## 2020-09-18 ENCOUNTER — Ambulatory Visit (INDEPENDENT_AMBULATORY_CARE_PROVIDER_SITE_OTHER): Payer: No Typology Code available for payment source | Admitting: Sports Medicine

## 2020-09-18 ENCOUNTER — Other Ambulatory Visit: Payer: Self-pay

## 2020-09-18 VITALS — BP 122/82 | Ht 72.0 in | Wt 240.0 lb

## 2020-09-18 DIAGNOSIS — M25511 Pain in right shoulder: Secondary | ICD-10-CM | POA: Diagnosis not present

## 2020-09-18 NOTE — Progress Notes (Signed)
   Subjective:    Patient ID: Tony Nguyen, male    DOB: 1960-09-14, 60 y.o.   MRN: [id number]  HPI chief complaint: Right shoulder pain  Patient presents today complaining of right shoulder pain that has been present for about a month.  He injured the right shoulder at work while he was reaching out to catch a box that was slipping.  He had immediate pain.  Since then he has noted pain and weakness in this arm.  Great difficulty sleeping at night as well.  He has had some left shoulder pain in the past but denies previous injury to the right arm.  He saw his primary care physician who was worried about a possible rotator cuff tear and referred him to our office for evaluation.  He is right-hand dominant.  Past medical history reviewed Medications reviewed Allergies reviewed    Review of Systems As above    Objective:   Physical Exam  Well-developed, well-nourished.  No acute distress  Right shoulder: Limited active forward flexion abduction to 90 degrees passive range of motion is to 160 degrees.  Full active internal and external rotation but it is painful.  He has no tenderness to palpation.  Positive drop arm test.  4/5 strength with resisted supraspinatus and external rotation on the right compared to 5/5 on the left.  Good strength with resisted internal rotation.  Neurovascularly intact distally.  Limited MSK ultrasound of the right shoulder shows what appears to be a complete and retracted tear of the supraspinatus tendon     Assessment & Plan:   Right shoulder pain and weakness secondary to full-thickness rotator cuff tear  Patient will proceed with an x-ray of the right shoulder as ordered by his PCP.  We will then need to order an MRI of the right shoulder for presurgical planning.  Patient will follow up with me in the office to discuss those results when available.  It should be noted that his injury is now one month old so the MRI of this shoulder should be  expedited so that he has the best chance for a successful rotator cuff repair.

## 2020-10-01 ENCOUNTER — Other Ambulatory Visit: Payer: No Typology Code available for payment source

## 2020-10-02 ENCOUNTER — Ambulatory Visit: Payer: No Typology Code available for payment source | Admitting: Sports Medicine

## 2020-10-02 NOTE — Progress Notes (Deleted)
PCP: Katha Cabal, DO  Subjective:   HPI: Patient is a 60 y.o. male here for right shoulder pain with known full thickness right rotator cuff tear.  Patient presents today complaining of right shoulder pain that has been present for 2 months.  He injured the right shoulder at work while he was reaching out to catch a box that was slipping.  He had immediate pain.  Since then he has noted pain and weakness in this arm.  Great difficulty sleeping at night as well.  He has had some left shoulder pain in the past but denies previous injury to the right arm.  He saw his primary care physician who was worried about a possible rotator cuff tear and referred him to our office for evaluation.  He is right-hand dominant.  Last visit we discussed getting an MRI of his shoulder which has not yet been completed.   ***  Past Medical History:  Diagnosis Date   Bug bite 08/09/2018   Duodenal ulcer due to Helicobacter pylori 09/25/2010   EGD confirmed that duodenal ulcers and H. pylori gastritis in 08/2009. Treated with triple  therapy at [facility].    Gallbladder polyp 09/25/2010   Major depression in remission [facility])    OTHER DISEASES OF NASAL CAVITY AND SINUSES 02/23/2008   Qualifier: Diagnosis of  By: Daphine Deutscher MD, [facility]     RHINITIS, CHRONIC 04/16/2006   Qualifier: Diagnosis of  By: Bebe Shaggy     Screening for prostate cancer 08/12/2019   TENNIS ELBOW 04/16/2006   Qualifier: Diagnosis of  By: Bebe Shaggy     Tinea corporis 06/01/2017    Current Outpatient Medications on File Prior to Visit  Medication Sig Dispense Refill   Diclofenac Sodium 3 % GEL Apply 1 application topically 4 (four) times daily as needed. 100 g 0   naproxen (NAPROSYN) 500 MG tablet Take 1 tablet (500 mg total) by mouth 2 (two) times daily with a meal. 30 tablet 0   sildenafil (VIAGRA) 100 MG tablet Take 0.5-1 tablets (50-100 mg total) by mouth daily as needed for erectile dysfunction. 5 tablet 11   No current  facility-administered medications on file prior to visit.    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   Past 2 daughters age 67 and 73.   Undergoing a separation from his wife.      Works out regularly with running and weightlifting.   Social Determinants of Health   Financial Resource Strain: Not on file  Food Insecurity: Not on file  Transportation  Needs: Not on file  Physical Activity: Not on file  Stress: Not on file  Social Connections: Not on file  Intimate Partner Violence: Not on file    Family History  Problem Relation Age of Onset   High blood pressure Father    Kidney disease Cousin    Diabetes Neg Hx    Early death Neg Hx    Heart disease Neg Hx    Hyperlipidemia Neg Hx    Hypertension Neg Hx     There were no vitals taken for this visit.  No flowsheet data found.  No flowsheet data found.  Review of Systems: See HPI above.     Objective:  Physical Exam:  Gen: NAD, comfortable in exam room  ***   Assessment & Plan:  1. Right rotator cuff tear, full thickness ***

## 2020-11-22 ENCOUNTER — Telehealth: Payer: Self-pay

## 2020-11-22 NOTE — Telephone Encounter (Signed)
Pt calling asking for a letter stating we referred him to Sports Medicine for his rt shoulder. Needs this for Workman's Comp to pay for the MRI that Sports Medicine ordered. Please call pt when this is ready to be picked up. Sunday Spillers, CMA

## 2020-11-28 NOTE — Telephone Encounter (Signed)
Letter has been placed up for pick up and the patient has been made aware.

## 2020-12-16 ENCOUNTER — Other Ambulatory Visit: Payer: Self-pay

## 2020-12-16 ENCOUNTER — Ambulatory Visit
Admission: RE | Admit: 2020-12-16 | Discharge: 2020-12-16 | Disposition: A | Discharge status: Home / Self Care | Payer: No Typology Code available for payment source | Source: Ambulatory Visit | Attending: Sports Medicine | Admitting: Sports Medicine

## 2020-12-16 DIAGNOSIS — M25511 Pain in right shoulder: Secondary | ICD-10-CM

## 2020-12-17 ENCOUNTER — Ambulatory Visit (INDEPENDENT_AMBULATORY_CARE_PROVIDER_SITE_OTHER): Payer: No Typology Code available for payment source | Admitting: Family Medicine

## 2020-12-17 VITALS — BP 136/85 | HR 87 | Ht 72.0 in | Wt 270.4 lb

## 2020-12-17 DIAGNOSIS — M25511 Pain in right shoulder: Secondary | ICD-10-CM | POA: Diagnosis not present

## 2020-12-17 MED ORDER — CYCLOBENZAPRINE HCL 10 MG PO TABS: 10.0000 mg | ORAL_TABLET | Freq: Every day | ORAL | 0 refills | Status: DC

## 2020-12-17 NOTE — Patient Instructions (Addendum)
Your MRI results have not yet come back. You will need to follow-up with the sports medicine practice for further management. Their phone number is [phone].   I will send in some muscle relaxants to help you sleep at night with the pain. Please do not operate heavy machinery or drive after taking this medication.

## 2020-12-17 NOTE — Progress Notes (Signed)
    SUBJECTIVE:   CHIEF COMPLAINT / HPI:   Right shoulder pain  Patient presents for follow-up on his right shoulder pain.  He was initially seen for it in July of this year after a work incident and was referred to sports medicine.  He had an MRI completed yesterday that was ordered by sports medicine for further evaluation if surgical intervention is necessary.  Patient has had 1 appointment with sports medicine but has not followed up since August.  He states the pain has not changed from prior visits but that he is having a lot of issues with falling asleep at night due to the pain.  PERTINENT  PMH / PSH: Reviewed  OBJECTIVE:   BP 136/85   Pulse 87   Ht 6' (1.829 m)   Wt 270 lb 6.4 oz (122.7 kg)   SpO2 99%   BMI 36.67 kg/m   General: NAD, well-appearing, well-nourished Respiratory: No respiratory distress, breathing comfortably, able to speak in full sentences Skin: warm and dry, no rashes noted on exposed skin Psych: Appropriate affect and mood Right shoulder limited active forward flexion, abduction is limited to 90 degrees with active motion and about 130 degrees with passive.  Positive drop arm test.  Patient does not have any tenderness to palpation on exam today.   ASSESSMENT/PLAN:   Acute pain of right shoulder Patient previously evaluated by sports medicine, MRI was completed yesterday but has not yet been read.  Patient has not had any change in his pain since the event has happened in symptoms evaluations.  He has not followed up with sports medicine since initial evaluation.  Discussed with patient about following up with them and to call their office, hopefully MRI results will be available by time he is able to make a visit.  Prescribed Flexeril 10 mg nightly to assist with sleep in the setting of shoulder pain.  Evelena Leyden, DO [facility]

## 2020-12-20 ENCOUNTER — Ambulatory Visit (INDEPENDENT_AMBULATORY_CARE_PROVIDER_SITE_OTHER): Payer: No Typology Code available for payment source | Admitting: Sports Medicine

## 2020-12-20 VITALS — BP 142/92 | Ht 72.0 in | Wt 260.0 lb

## 2020-12-20 DIAGNOSIS — M75101 Unspecified rotator cuff tear or rupture of right shoulder, not specified as traumatic: Secondary | ICD-10-CM | POA: Diagnosis not present

## 2020-12-20 DIAGNOSIS — M25511 Pain in right shoulder: Secondary | ICD-10-CM | POA: Diagnosis not present

## 2020-12-20 NOTE — Progress Notes (Signed)
Patient ID: Tony Nguyen, male   DOB: 16-Mar-1960, 60 y.o.   MRN: [id number]  Patient presents today to discuss MRI findings of his right shoulder.  MRI shows severe tendinosis of the supraspinatus tendon with a large high-grade partial-thickness bursal surface tear.  Also some moderate tendinosis of the infraspinatus tendon.  No noticeable muscle atrophy.  Based on these findings I recommend surgical consultation to discuss merits of rotator cuff debridement or repair.  We will schedule this with Dr. August Saucer.  The patient will continue with his current work restrictions until that appointment.  I will defer further work-up and treatment to the discretion of Dr. August Saucer and he will follow-up with me as needed.

## 2021-01-07 ENCOUNTER — Other Ambulatory Visit: Payer: Self-pay

## 2021-01-07 ENCOUNTER — Ambulatory Visit (INDEPENDENT_AMBULATORY_CARE_PROVIDER_SITE_OTHER): Payer: No Typology Code available for payment source | Admitting: Orthopedic Surgery

## 2021-01-07 ENCOUNTER — Encounter: Payer: Self-pay | Admitting: Orthopedic Surgery

## 2021-01-07 DIAGNOSIS — S46011D Strain of muscle(s) and tendon(s) of the rotator cuff of right shoulder, subsequent encounter: Secondary | ICD-10-CM

## 2021-01-07 NOTE — Progress Notes (Signed)
Office Visit Note   Patient: Tony Nguyen           Date of Birth: [birth year]           MRN: [id number] Visit Date: 01/07/2021 Requested by: Ralene Cork, DO [address] [place],  [place] [address] PCP: Katha Cabal, DO  Subjective: Chief Complaint  Patient presents with   Right Shoulder - Pain    HPI: Patient presents for evaluation of right shoulder pain.  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  Felt a pop in the right shoulder.  He reports that the pain wakes him from sleep at night.  He is right-hand dominant.  Reports relatively constant pain in the right shoulder with decreased range of motion.  He has not had an injection.  He has been taking some over-the-counter medications.  Nighttime is worse than daytime.  MRI scan does show supraspinatus tendon tear with minimal retraction.  He also has fairly significant spur of the acromion.  Biceps tendinitis also present.  Subscap intact.  Intrinsic degeneration of the infraspinatus present.              ROS: All systems reviewed are negative as they relate to the chief complaint within the history of present illness.  Patient denies  fevers or chills.   Assessment & Plan: Visit Diagnoses:  1. Traumatic complete tear of right rotator cuff, subsequent encounter     Plan: Impression is 5 months symptomatic right shoulder rotator cuff tear with possible early adhesive capsulitis.  Does have weakness on exam.  Plan is right shoulder arthroscopy with biceps tendon release biceps tenodesis mini open rotator cuff tear repair as well as subacromial decompression.  Risk and benefits are discussed with the patient including not limited to infection nerve vessel damage shoulder stiffness.  Would like for him to use shoulder CPM for at least 3 weeks after the procedure.  Anticipate at least 3 months out of work from heavy lifting types of activities.  Patient understands risk  benefits and wishes to proceed.  He has been working since Programmer, systems.  All questions answered.  Follow-Up Instructions: No follow-ups on file.   Orders:  No orders of the defined types were placed in this encounter.  No orders of the defined types were placed in this encounter.     Procedures: No procedures performed   Clinical Data: No additional findings.  Objective: Vital Signs: There were no vitals taken for this visit.  Physical Exam:   Constitutional: Patient appears well-developed HEENT:  Head: Normocephalic Eyes:EOM are normal Neck: Normal range of motion Cardiovascular: Normal rate Pulmonary/chest: Effort normal Neurologic: Patient is alert Skin: Skin is warm Psychiatric: Patient has normal mood and affect   Ortho Exam: Ortho exam demonstrates 5 out of 5 EPL FPL interosseous strength.  Neck range of motion intact.  He does have symmetric external rotation at 15 degrees of abduction to about 50 degrees bilaterally.  Isolated glenohumeral abduction is about 75 on the right 85 on the left.  Forward flexion is about 175 on the left and 160 on the right.  Does have infraspinatus supraspinatus weakness on the right to manual motor testing at 4 out of 5.  Does have pain radiating into the biceps region as well.  No discrete AC joint tenderness.  Specialty Comments:  No specialty comments available.  Imaging: No results found.   PMFS History: Patient Active Problem List  Diagnosis Date Noted   Acute pain of right shoulder 01/11/2020   Blood pressure check 10/20/2019   Mood changes 04/12/2019   Acute midline low back pain without sciatica 02/13/2019   Erectile dysfunction 07/23/2018   Ganglion cyst of dorsum of left wrist 04/20/2018   Prediabetes 04/17/2016   OBESITY, NOS 04/16/2006   Past Medical History:  Diagnosis Date   Bug bite 08/09/2018   Duodenal ulcer due to Helicobacter pylori 09/25/2010   EGD confirmed that duodenal ulcers and H. pylori  gastritis in 08/2009. Treated with triple  therapy at [facility].    Gallbladder polyp 09/25/2010   Major depression in remission [facility])    OTHER DISEASES OF NASAL CAVITY AND SINUSES 02/23/2008   Qualifier: Diagnosis of  By: Daphine Deutscher MD, [facility]     RHINITIS, CHRONIC 04/16/2006   Qualifier: Diagnosis of  By: Bebe Shaggy     Screening for prostate cancer 08/12/2019   TENNIS ELBOW 04/16/2006   Qualifier: Diagnosis of  By: Bebe Shaggy     Tinea corporis 06/01/2017    Family History  Problem Relation Age of Onset   High blood pressure Father    Kidney disease Cousin    Diabetes Neg Hx    Early death Neg Hx    Heart disease Neg Hx    Hyperlipidemia Neg Hx    Hypertension Neg Hx     Past Surgical History:  Procedure Laterality Date   APPENDECTOMY  1981   CHOLECYSTECTOMY  10/15/10   Path results revealed chronic cholecystitis   ELBOW SURGERY  2005   right   UPPER GASTROINTESTINAL ENDOSCOPY     Social History   Occupational History   Occupation: Financial planner: [employer]    Comment: [employer]  Tobacco Use   Smoking status: Never   Smokeless tobacco: Never  Vaping Use   Vaping Use: Never used  Substance and Sexual Activity   Alcohol use: No   Drug use: No   Sexual activity: Yes

## 2021-01-17 ENCOUNTER — Telehealth: Payer: Self-pay | Admitting: Orthopedic Surgery

## 2021-01-17 NOTE — Telephone Encounter (Signed)
Patient dropped off FMLA forms,auth & $25 cash. To [employer]

## 2021-01-29 NOTE — Progress Notes (Signed)
Surgical Instructions    Your procedure is scheduled on Tuesday, December 20th.  Report to [facility] Main Entrance "A" at 8:45 A.M., then check in with the Admitting office.  Call this number if you have problems the morning of surgery:  [phone]   If you have any questions prior to your surgery date call [phone]: Open Monday-Friday 8am-4pm    Remember:  Do not eat after midnight the night before your surgery  You may drink clear liquids until 7:45 AM the morning of your surgery.   Clear liquids allowed are: Water, Non-Citrus Juices (without pulp), Carbonated Beverages, Clear Tea, Black Coffee ONLY (NO MILK, CREAM OR POWDERED CREAMER of any kind), and Gatorade    Take these medicines the morning of surgery with A SIP OF WATER NONE    As of today, STOP taking any Aspirin (unless otherwise instructed by your surgeon) Aleve, Naproxen, Ibuprofen, Motrin, Advil, Goody's, BC's, all herbal medications, fish oil, and all vitamins.   DAY OF SURGERY:         Do not wear jewelry  Do not wear lotions, powders, colognes, or deodorant. Men may shave face and neck. Do not bring valuables to the hospital.             [facility] is not responsible for any belongings or valuables.  Do NOT Smoke (Tobacco/Vaping)  24 hours prior to your procedure  If you use a CPAP at night, you may bring your mask for your overnight stay.   Contacts, glasses, hearing aids, dentures or partials may not be worn into surgery, please bring cases for these belongings   For patients admitted to the hospital, discharge time will be determined by your treatment team.   Patients discharged the day of surgery will not be allowed to drive home, and someone needs to stay with them for 24 hours.  NO VISITORS WILL BE ALLOWED IN PRE-OP WHERE PATIENTS ARE PREPPED FOR SURGERY.  ONLY 1 SUPPORT PERSON MAY BE PRESENT IN THE WAITING ROOM WHILE YOU ARE IN SURGERY.  IF YOU ARE TO BE ADMITTED, ONCE YOU ARE IN YOUR ROOM YOU  WILL BE ALLOWED TWO (2) VISITORS. 1 (ONE) VISITOR MAY STAY OVERNIGHT BUT MUST ARRIVE TO THE ROOM BY 8pm.  Minor children may have two parents present. Special consideration for safety and communication needs will be reviewed on a case by case basis.  Special instructions:    Oral Hygiene is also important to reduce your risk of infection.  Remember - BRUSH YOUR TEETH THE MORNING OF SURGERY WITH YOUR REGULAR TOOTHPASTE   [place]- Preparing For Surgery  Before surgery, you can play an important role. Because skin is not sterile, your skin needs to be as free of germs as possible. You can reduce the number of germs on your skin by washing with CHG (chlorahexidine gluconate) Soap before surgery.  CHG is an antiseptic cleaner which kills germs and bonds with the skin to continue killing germs even after washing.     Please do not use if you have an allergy to CHG or antibacterial soaps. If your skin becomes reddened/irritated stop using the CHG.  Do not shave (including legs and underarms) for at least 48 hours prior to first CHG shower. It is OK to shave your face.  Please follow these instructions carefully.     Shower the NIGHT BEFORE SURGERY and the MORNING OF SURGERY with CHG Soap.   If you chose to wash your hair, wash your hair  first as usual with your normal shampoo. After you shampoo, rinse your hair and body thoroughly to remove the shampoo.  Then [employer] and genitals (private parts) with your normal soap and rinse thoroughly to remove soap.  After that Use CHG Soap as you would any other liquid soap. You can apply CHG directly to the skin and wash gently with a scrungie or a clean washcloth.   Apply the CHG Soap to your body ONLY FROM THE NECK DOWN.  Do not use on open wounds or open sores. Avoid contact with your eyes, ears, mouth and genitals (private parts). Wash Face and genitals (private parts)  with your normal soap.   Wash thoroughly, paying special attention to the area  where your surgery will be performed.  Thoroughly rinse your body with warm water from the neck down.  DO NOT shower/wash with your normal soap after using and rinsing off the CHG Soap.  Pat yourself dry with a CLEAN TOWEL.  Wear CLEAN PAJAMAS to bed the night before surgery  Place CLEAN SHEETS on your bed the night before your surgery  DO NOT SLEEP WITH PETS.   Day of Surgery:  Take a shower with CHG soap. Wear Clean/Comfortable clothing the morning of surgery Do not apply any deodorants/lotions.   Remember to brush your teeth WITH YOUR REGULAR TOOTHPASTE.   Please read over the following fact sheets that you were given.

## 2021-01-30 ENCOUNTER — Encounter (HOSPITAL_COMMUNITY): Payer: Self-pay

## 2021-01-30 ENCOUNTER — Encounter (HOSPITAL_COMMUNITY)
Admission: RE | Admit: 2021-01-30 | Discharge: 2021-01-30 | Disposition: A | Discharge status: Home / Self Care | Payer: No Typology Code available for payment source | Source: Ambulatory Visit | Attending: Orthopedic Surgery | Admitting: Orthopedic Surgery

## 2021-01-30 ENCOUNTER — Other Ambulatory Visit: Payer: Self-pay

## 2021-01-30 VITALS — BP 150/94 | HR 92 | Temp 97.8°F | Resp 18 | Ht 72.0 in | Wt 269.8 lb

## 2021-01-30 DIAGNOSIS — Z01812 Encounter for preprocedural laboratory examination: Secondary | ICD-10-CM | POA: Insufficient documentation

## 2021-01-30 DIAGNOSIS — Z01818 Encounter for other preprocedural examination: Secondary | ICD-10-CM

## 2021-01-30 HISTORY — DX: Gastro-esophageal reflux disease without esophagitis: K21.9

## 2021-01-30 LAB — BASIC METABOLIC PANEL
Anion gap: 6 (ref 5–15)
BUN: 15 mg/dL (ref 6–20)
CO2: 28 mmol/L (ref 22–32)
Calcium: 9.5 mg/dL (ref 8.9–10.3)
Chloride: 102 mmol/L (ref 98–111)
Creatinine, Ser: 1.28 mg/dL — ABNORMAL HIGH (ref 0.61–1.24)
GFR, Estimated: >60 mL/min (ref >60)
Glucose, Bld: 107 mg/dL — ABNORMAL HIGH (ref 70–99)
Potassium: 3.7 mmol/L (ref 3.5–5.1)
Sodium: 136 mmol/L (ref 135–145)

## 2021-01-30 LAB — SURGICAL PCR SCREEN
MRSA, PCR: NEGATIVE
Staphylococcus aureus: POSITIVE — AB

## 2021-01-30 LAB — CBC
HCT: 41.3 % (ref 39.0–52.0)
Hemoglobin: 14 g/dL (ref 13.0–17.0)
MCH: 29.7 pg (ref 26.0–34.0)
MCHC: 33.9 g/dL (ref 30.0–36.0)
MCV: 87.5 fL (ref 80.0–100.0)
Platelets: 236 10*3/uL (ref 150–400)
RBC: 4.72 MIL/uL (ref 4.22–5.81)
RDW: 12.7 % (ref 11.5–15.5)
WBC: 6.5 10*3/uL (ref 4.0–10.5)
nRBC: 0 % (ref 0.0–0.2)

## 2021-01-30 NOTE — Progress Notes (Signed)
PCP: Katha Cabal, DO Cardiologist: denies  EKG: 04/21/15 CXR: na ECHO: denies Stress Test: denies Cardiac Cath: denies  Fasting Blood Sugar- na Checks Blood Sugar__na_ times a day  OSA/CPAP: No  ASA/Blood Thinner: No  Covid test not needed - ambulatory  Anesthesia Review: No  Patient denies shortness of breath, fever, cough, and chest pain at PAT appointment.  Patient verbalized understanding of instructions provided today at the PAT appointment.  Patient asked to review instructions at home and day of surgery.

## 2021-01-30 NOTE — Progress Notes (Signed)
Surgical Instructions    Your procedure is scheduled on Tuesday, December 20th.  Report to [facility] Main Entrance "A" at 8:45 A.M., then check in with the Admitting office.  Call this number if you have problems the morning of surgery:  [phone]   If you have any questions prior to your surgery date call [phone]: Open Monday-Friday 8am-4pm    Remember:  Do not eat after midnight the night before your surgery  You may drink clear liquids until 7:45 AM the morning of your surgery.   Clear liquids allowed are: Water, Non-Citrus Juices (without pulp), Carbonated Beverages, Clear Tea, Black Coffee ONLY (NO MILK, CREAM OR POWDERED CREAMER of any kind), and Gatorade  Please complete your PRE-SURGERY ENSURE that was provided to you by 7:45 the morning of surgery.  Please, if able, drink it in one setting. DO NOT SIP.     Take these medicines the morning of surgery with A SIP OF WATER NONE    As of today, STOP taking any Aspirin (unless otherwise instructed by your surgeon) Aleve, Naproxen, Ibuprofen, Motrin, Advil, Goody's, BC's, all herbal medications, fish oil, and all vitamins.   DAY OF SURGERY:         Do not wear jewelry  Do not wear lotions, powders, colognes, or deodorant. Men may shave face and neck. Do not bring valuables to the hospital.             [facility] is not responsible for any belongings or valuables.  Do NOT Smoke (Tobacco/Vaping)  24 hours prior to your procedure  If you use a CPAP at night, you may bring your mask for your overnight stay.   Contacts, glasses, hearing aids, dentures or partials may not be worn into surgery, please bring cases for these belongings   For patients admitted to the hospital, discharge time will be determined by your treatment team.   Patients discharged the day of surgery will not be allowed to drive home, and someone needs to stay with them for 24 hours.  NO VISITORS WILL BE ALLOWED IN PRE-OP WHERE PATIENTS ARE  PREPPED FOR SURGERY.  ONLY 1 SUPPORT PERSON MAY BE PRESENT IN THE WAITING ROOM WHILE YOU ARE IN SURGERY.  IF YOU ARE TO BE ADMITTED, ONCE YOU ARE IN YOUR ROOM YOU WILL BE ALLOWED TWO (2) VISITORS. 1 (ONE) VISITOR MAY STAY OVERNIGHT BUT MUST ARRIVE TO THE ROOM BY 8pm.  Minor children may have two parents present. Special consideration for safety and communication needs will be reviewed on a case by case basis.  Special instructions:    Oral Hygiene is also important to reduce your risk of infection.  Remember - BRUSH YOUR TEETH THE MORNING OF SURGERY WITH YOUR REGULAR TOOTHPASTE   [place]- Preparing For Surgery  Before surgery, you can play an important role. Because skin is not sterile, your skin needs to be as free of germs as possible. You can reduce the number of germs on your skin by washing with CHG (chlorahexidine gluconate) Soap before surgery.  CHG is an antiseptic cleaner which kills germs and bonds with the skin to continue killing germs even after washing.     Please do not use if you have an allergy to CHG or antibacterial soaps. If your skin becomes reddened/irritated stop using the CHG.  Do not shave (including legs and underarms) for at least 48 hours prior to first CHG shower. It is OK to shave your face.  Please follow these instructions  carefully.     Shower the NIGHT BEFORE SURGERY and the MORNING OF SURGERY with CHG Soap.   If you chose to wash your hair, wash your hair first as usual with your normal shampoo. After you shampoo, rinse your hair and body thoroughly to remove the shampoo.  Then [employer] and genitals (private parts) with your normal soap and rinse thoroughly to remove soap.  After that Use CHG Soap as you would any other liquid soap. You can apply CHG directly to the skin and wash gently with a scrungie or a clean washcloth.   Apply the CHG Soap to your body ONLY FROM THE NECK DOWN.  Do not use on open wounds or open sores. Avoid contact with your eyes,  ears, mouth and genitals (private parts). Wash Face and genitals (private parts)  with your normal soap.   Wash thoroughly, paying special attention to the area where your surgery will be performed.  Thoroughly rinse your body with warm water from the neck down.  DO NOT shower/wash with your normal soap after using and rinsing off the CHG Soap.  Pat yourself dry with a CLEAN TOWEL.  Wear CLEAN PAJAMAS to bed the night before surgery  Place CLEAN SHEETS on your bed the night before your surgery  DO NOT SLEEP WITH PETS.   Day of Surgery:  Take a shower with CHG soap. Wear Clean/Comfortable clothing the morning of surgery Do not apply any deodorants/lotions.   Remember to brush your teeth WITH YOUR REGULAR TOOTHPASTE.   Please read over the following fact sheets that you were given.

## 2021-02-05 ENCOUNTER — Ambulatory Visit (HOSPITAL_COMMUNITY)
Admission: RE | Admit: 2021-02-05 | Discharge: 2021-02-05 | Disposition: A | Discharge status: Home / Self Care | Payer: No Typology Code available for payment source | Attending: Orthopedic Surgery | Admitting: Orthopedic Surgery

## 2021-02-05 ENCOUNTER — Encounter (HOSPITAL_COMMUNITY)
Admission: RE | Disposition: A | Discharge status: Home / Self Care | Payer: Self-pay | Source: Home / Self Care | Attending: Orthopedic Surgery

## 2021-02-05 ENCOUNTER — Encounter (HOSPITAL_COMMUNITY): Payer: Self-pay | Admitting: Orthopedic Surgery

## 2021-02-05 ENCOUNTER — Ambulatory Visit (HOSPITAL_COMMUNITY): Payer: Self-pay | Admitting: Anesthesiology

## 2021-02-05 ENCOUNTER — Other Ambulatory Visit: Payer: Self-pay

## 2021-02-05 DIAGNOSIS — S46011A Strain of muscle(s) and tendon(s) of the rotator cuff of right shoulder, initial encounter: Secondary | ICD-10-CM | POA: Diagnosis not present

## 2021-02-05 DIAGNOSIS — X500XXA Overexertion from strenuous movement or load, initial encounter: Secondary | ICD-10-CM | POA: Insufficient documentation

## 2021-02-05 DIAGNOSIS — S43431A Superior glenoid labrum lesion of right shoulder, initial encounter: Secondary | ICD-10-CM | POA: Diagnosis not present

## 2021-02-05 DIAGNOSIS — Z8719 Personal history of other diseases of the digestive system: Secondary | ICD-10-CM | POA: Diagnosis not present

## 2021-02-05 DIAGNOSIS — M778 Other enthesopathies, not elsewhere classified: Secondary | ICD-10-CM | POA: Insufficient documentation

## 2021-02-05 DIAGNOSIS — Y990 Civilian activity done for income or pay: Secondary | ICD-10-CM | POA: Insufficient documentation

## 2021-02-05 DIAGNOSIS — Y929 Unspecified place or not applicable: Secondary | ICD-10-CM | POA: Diagnosis not present

## 2021-02-05 DIAGNOSIS — M7521 Bicipital tendinitis, right shoulder: Secondary | ICD-10-CM | POA: Diagnosis not present

## 2021-02-05 DIAGNOSIS — M1990 Unspecified osteoarthritis, unspecified site: Secondary | ICD-10-CM | POA: Insufficient documentation

## 2021-02-05 DIAGNOSIS — K219 Gastro-esophageal reflux disease without esophagitis: Secondary | ICD-10-CM | POA: Diagnosis not present

## 2021-02-05 DIAGNOSIS — Z6835 Body mass index (BMI) 35.0-35.9, adult: Secondary | ICD-10-CM | POA: Insufficient documentation

## 2021-02-05 DIAGNOSIS — M75121 Complete rotator cuff tear or rupture of right shoulder, not specified as traumatic: Secondary | ICD-10-CM | POA: Diagnosis not present

## 2021-02-05 DIAGNOSIS — Z8711 Personal history of peptic ulcer disease: Secondary | ICD-10-CM | POA: Diagnosis not present

## 2021-02-05 DIAGNOSIS — Z01818 Encounter for other preprocedural examination: Secondary | ICD-10-CM

## 2021-02-05 HISTORY — PX: SHOULDER ARTHROSCOPY WITH SUBACROMIAL DECOMPRESSION, ROTATOR CUFF REPAIR AND BICEP TENDON REPAIR: SHX5687

## 2021-02-05 SURGERY — SHOULDER ARTHROSCOPY WITH SUBACROMIAL DECOMPRESSION, ROTATOR CUFF REPAIR AND BICEP TENDON REPAIR
Anesthesia: Regional | Laterality: Right

## 2021-02-05 MED ORDER — SUGAMMADEX SODIUM 200 MG/2ML IV SOLN
INTRAVENOUS | Status: DC | PRN
  Administered 2021-02-05: 200 mg via INTRAVENOUS

## 2021-02-05 MED ORDER — PROPOFOL 10 MG/ML IV BOLUS
INTRAVENOUS | Status: AC
  Filled 2021-02-05: qty 20

## 2021-02-05 MED ORDER — EPINEPHRINE PF 1 MG/ML IJ SOLN
INTRAMUSCULAR | Status: DC | PRN
  Administered 2021-02-05 (×2): 1 mg

## 2021-02-05 MED ORDER — ONDANSETRON HCL 4 MG/2ML IJ SOLN: 4.0000 mg | Freq: Once | INTRAMUSCULAR | Status: DC | PRN

## 2021-02-05 MED ORDER — SUCCINYLCHOLINE CHLORIDE 200 MG/10ML IV SOSY
PREFILLED_SYRINGE | INTRAVENOUS | Status: AC
  Filled 2021-02-05: qty 10

## 2021-02-05 MED ORDER — ACETAMINOPHEN 325 MG PO TABS
325.0000 mg | ORAL_TABLET | ORAL | Status: DC | PRN
  Administered 2021-02-05: 15:00:00 650 mg via ORAL

## 2021-02-05 MED ORDER — ACETAMINOPHEN 160 MG/5ML PO SOLN: 325.0000 mg | ORAL | Status: DC | PRN

## 2021-02-05 MED ORDER — POVIDONE-IODINE 10 % EX SWAB
2.0000 "application " | Freq: Once | CUTANEOUS | Status: AC
  Administered 2021-02-05: 2 via TOPICAL

## 2021-02-05 MED ORDER — ONDANSETRON HCL 4 MG/2ML IJ SOLN
INTRAMUSCULAR | Status: DC | PRN
  Administered 2021-02-05: 4 mg via INTRAVENOUS

## 2021-02-05 MED ORDER — ORAL CARE MOUTH RINSE: 15.0000 mL | Freq: Once | OROMUCOSAL | Status: AC

## 2021-02-05 MED ORDER — FENTANYL CITRATE (PF) 100 MCG/2ML IJ SOLN: 100.0000 ug | Freq: Once | INTRAMUSCULAR | Status: AC

## 2021-02-05 MED ORDER — PHENYLEPHRINE HCL-NACL 20-0.9 MG/250ML-% IV SOLN
INTRAVENOUS | Status: DC | PRN
  Administered 2021-02-05: 50 ug/min via INTRAVENOUS

## 2021-02-05 MED ORDER — CEFAZOLIN IN SODIUM CHLORIDE 3-0.9 GM/100ML-% IV SOLN
3.0000 g | INTRAVENOUS | Status: AC
  Administered 2021-02-05: 11:00:00 3 g via INTRAVENOUS
  Filled 2021-02-05: qty 100

## 2021-02-05 MED ORDER — CHLORHEXIDINE GLUCONATE 0.12 % MT SOLN: 15.0000 mL | Freq: Once | OROMUCOSAL | Status: AC

## 2021-02-05 MED ORDER — DEXMEDETOMIDINE (PRECEDEX) IN NS 20 MCG/5ML (4 MCG/ML) IV SYRINGE
PREFILLED_SYRINGE | INTRAVENOUS | Status: AC
  Filled 2021-02-05: qty 5

## 2021-02-05 MED ORDER — DEXAMETHASONE SODIUM PHOSPHATE 10 MG/ML IJ SOLN
INTRAMUSCULAR | Status: DC | PRN
  Administered 2021-02-05: 10 mg via INTRAVENOUS

## 2021-02-05 MED ORDER — POVIDONE-IODINE 7.5 % EX SOLN: Freq: Once | CUTANEOUS | Status: DC

## 2021-02-05 MED ORDER — PHENYLEPHRINE 40 MCG/ML (10ML) SYRINGE FOR IV PUSH (FOR BLOOD PRESSURE SUPPORT)
PREFILLED_SYRINGE | INTRAVENOUS | Status: DC | PRN
  Administered 2021-02-05 (×5): 80 ug via INTRAVENOUS

## 2021-02-05 MED ORDER — MIDAZOLAM HCL 2 MG/2ML IJ SOLN
INTRAMUSCULAR | Status: AC
  Administered 2021-02-05: 10:00:00 2 mg via INTRAVENOUS
  Filled 2021-02-05: qty 2

## 2021-02-05 MED ORDER — CEFAZOLIN SODIUM-DEXTROSE 2-3 GM-%(50ML) IV SOLR: INTRAVENOUS | Status: DC | PRN

## 2021-02-05 MED ORDER — ACETAMINOPHEN 325 MG PO TABS
ORAL_TABLET | ORAL | Status: AC
  Filled 2021-02-05: qty 2

## 2021-02-05 MED ORDER — FENTANYL CITRATE (PF) 100 MCG/2ML IJ SOLN
INTRAMUSCULAR | Status: AC
  Administered 2021-02-05: 10:00:00 100 ug via INTRAVENOUS
  Filled 2021-02-05: qty 2

## 2021-02-05 MED ORDER — DEXAMETHASONE SODIUM PHOSPHATE 10 MG/ML IJ SOLN
INTRAMUSCULAR | Status: AC
  Filled 2021-02-05: qty 1

## 2021-02-05 MED ORDER — OXYCODONE HCL 5 MG PO TABS
5.0000 mg | ORAL_TABLET | Freq: Once | ORAL | Status: AC | PRN
  Administered 2021-02-05: 15:00:00 5 mg via ORAL

## 2021-02-05 MED ORDER — EPINEPHRINE PF 1 MG/ML IJ SOLN
INTRAMUSCULAR | Status: AC
  Filled 2021-02-05: qty 2

## 2021-02-05 MED ORDER — OXYCODONE HCL 5 MG/5ML PO SOLN: 5.0000 mg | Freq: Once | ORAL | Status: AC | PRN

## 2021-02-05 MED ORDER — FENTANYL CITRATE (PF) 250 MCG/5ML IJ SOLN
INTRAMUSCULAR | Status: DC | PRN
  Administered 2021-02-05: 50 ug via INTRAVENOUS
  Administered 2021-02-05: 100 ug via INTRAVENOUS

## 2021-02-05 MED ORDER — PROPOFOL 10 MG/ML IV BOLUS
INTRAVENOUS | Status: DC | PRN
  Administered 2021-02-05: 200 mg via INTRAVENOUS

## 2021-02-05 MED ORDER — MIDAZOLAM HCL 2 MG/2ML IJ SOLN
INTRAMUSCULAR | Status: DC | PRN
  Administered 2021-02-05: 2 mg via INTRAVENOUS

## 2021-02-05 MED ORDER — MIDAZOLAM HCL 2 MG/2ML IJ SOLN
INTRAMUSCULAR | Status: AC
  Filled 2021-02-05: qty 2

## 2021-02-05 MED ORDER — TRANEXAMIC ACID-NACL 1000-0.7 MG/100ML-% IV SOLN
INTRAVENOUS | Status: AC
  Filled 2021-02-05: qty 100

## 2021-02-05 MED ORDER — ONDANSETRON HCL 4 MG/2ML IJ SOLN
INTRAMUSCULAR | Status: AC
  Filled 2021-02-05: qty 2

## 2021-02-05 MED ORDER — SODIUM CHLORIDE 0.9 % IR SOLN
Status: DC | PRN
  Administered 2021-02-05: 3000 mL
  Administered 2021-02-05: 1000 mL
  Administered 2021-02-05: 3000 mL

## 2021-02-05 MED ORDER — METHOCARBAMOL 500 MG PO TABS: 500.0000 mg | ORAL_TABLET | Freq: Three times a day (TID) | ORAL | 0 refills | Status: DC | PRN

## 2021-02-05 MED ORDER — OXYCODONE HCL 5 MG PO TABS
ORAL_TABLET | ORAL | Status: AC
  Filled 2021-02-05: qty 1

## 2021-02-05 MED ORDER — ROCURONIUM BROMIDE 10 MG/ML (PF) SYRINGE
PREFILLED_SYRINGE | INTRAVENOUS | Status: DC | PRN
  Administered 2021-02-05: 50 mg via INTRAVENOUS

## 2021-02-05 MED ORDER — CHLORHEXIDINE GLUCONATE 0.12 % MT SOLN
OROMUCOSAL | Status: AC
  Administered 2021-02-05: 09:00:00 15 mL via OROMUCOSAL
  Filled 2021-02-05: qty 15

## 2021-02-05 MED ORDER — BUPIVACAINE HCL (PF) 0.5 % IJ SOLN
INTRAMUSCULAR | Status: DC | PRN
  Administered 2021-02-05: 10 mL via PERINEURAL

## 2021-02-05 MED ORDER — MIDAZOLAM HCL 2 MG/2ML IJ SOLN: 2.0000 mg | Freq: Once | INTRAMUSCULAR | Status: AC

## 2021-02-05 MED ORDER — OXYCODONE-ACETAMINOPHEN 5-325 MG PO TABS: 1.0000 | ORAL_TABLET | ORAL | 0 refills | Status: DC | PRN

## 2021-02-05 MED ORDER — FENTANYL CITRATE (PF) 250 MCG/5ML IJ SOLN
INTRAMUSCULAR | Status: AC
  Filled 2021-02-05: qty 5

## 2021-02-05 MED ORDER — LACTATED RINGERS IV SOLN: INTRAVENOUS | Status: DC

## 2021-02-05 MED ORDER — SUCCINYLCHOLINE CHLORIDE 200 MG/10ML IV SOSY
PREFILLED_SYRINGE | INTRAVENOUS | Status: DC | PRN
  Administered 2021-02-05: 100 mg via INTRAVENOUS

## 2021-02-05 MED ORDER — LIDOCAINE 2% (20 MG/ML) 5 ML SYRINGE
INTRAMUSCULAR | Status: AC
  Filled 2021-02-05: qty 5

## 2021-02-05 MED ORDER — BUPIVACAINE LIPOSOME 1.3 % IJ SUSP
INTRAMUSCULAR | Status: DC | PRN
  Administered 2021-02-05: 10 mL via PERINEURAL

## 2021-02-05 MED ORDER — FENTANYL CITRATE (PF) 100 MCG/2ML IJ SOLN: 25.0000 ug | INTRAMUSCULAR | Status: DC | PRN

## 2021-02-05 MED ORDER — TRANEXAMIC ACID-NACL 1000-0.7 MG/100ML-% IV SOLN
INTRAVENOUS | Status: DC | PRN
  Administered 2021-02-05: 1000 mg via INTRAVENOUS

## 2021-02-05 MED ORDER — LIDOCAINE 2% (20 MG/ML) 5 ML SYRINGE
INTRAMUSCULAR | Status: DC | PRN
  Administered 2021-02-05: 60 mg via INTRAVENOUS

## 2021-02-05 MED ORDER — ROCURONIUM BROMIDE 10 MG/ML (PF) SYRINGE
PREFILLED_SYRINGE | INTRAVENOUS | Status: AC
  Filled 2021-02-05: qty 10

## 2021-02-05 MED ORDER — MEPERIDINE HCL 25 MG/ML IJ SOLN: 6.2500 mg | INTRAMUSCULAR | Status: DC | PRN

## 2021-02-05 MED ORDER — PHENYLEPHRINE 40 MCG/ML (10ML) SYRINGE FOR IV PUSH (FOR BLOOD PRESSURE SUPPORT)
PREFILLED_SYRINGE | INTRAVENOUS | Status: AC
  Filled 2021-02-05: qty 10

## 2021-02-05 SURGICAL SUPPLY — 75 items
AID PSTN UNV HD RSTRNT DISP (MISCELLANEOUS) ×1
ANCH SUT 2 FBRTK KNTLS 1.8 (Anchor) ×1 IMPLANT
ANCH SUT 2 SWLK 19.1 CLS EYLT (Anchor) ×2 IMPLANT
ANCH SUT FBRTK 1.3 2 TPE (Anchor) ×1 IMPLANT
ANCHOR FBRTK 2.6 SUTURETAP 1.3 (Anchor) ×2 IMPLANT
ANCHOR SUT 1.8 FIBERTAK SB KL (Anchor) ×2 IMPLANT
ANCHOR SWIVELOCK BIO 4.75X19.1 (Anchor) ×4 IMPLANT
BAG COUNTER SPONGE SURGICOUNT (BAG) IMPLANT
BAG SPNG CNTER NS LX DISP (BAG)
BAG SURGICOUNT SPONGE COUNTING (BAG)
BLADE EXCALIBUR 4.0MM X 13CM (MISCELLANEOUS) ×1
BLADE EXCALIBUR 4.0X13 (MISCELLANEOUS) ×3 IMPLANT
BURR OVAL 8 FLU 4.0MM X 13CM (MISCELLANEOUS)
BURR OVAL 8 FLU 4.0X13 (MISCELLANEOUS) IMPLANT
COOLER ICEMAN CLASSIC (MISCELLANEOUS) ×2 IMPLANT
COVER SURGICAL LIGHT HANDLE (MISCELLANEOUS) ×4 IMPLANT
DRAPE INCISE IOBAN 66X45 STRL (DRAPES) ×8 IMPLANT
DRAPE STERI 35X30 U-POUCH (DRAPES) ×4 IMPLANT
DRAPE U-SHAPE 47X51 STRL (DRAPES) ×8 IMPLANT
DRSG TEGADERM 4X10 (GAUZE/BANDAGES/DRESSINGS) ×2 IMPLANT
DRSG TEGADERM 4X4.75 (GAUZE/BANDAGES/DRESSINGS) ×12 IMPLANT
DURAPREP 26ML APPLICATOR (WOUND CARE) ×4 IMPLANT
DW OUTFLOW CASSETTE/TUBE SET (MISCELLANEOUS) ×4 IMPLANT
ELECT REM PT RETURN 9FT ADLT (ELECTROSURGICAL) ×3
ELECTRODE REM PT RTRN 9FT ADLT (ELECTROSURGICAL) ×2 IMPLANT
EXCALIBUR 3.8MM X 13CM (MISCELLANEOUS) ×2 IMPLANT
GAUZE SPONGE 4X4 12PLY STRL (GAUZE/BANDAGES/DRESSINGS) ×4 IMPLANT
GAUZE XEROFORM 1X8 LF (GAUZE/BANDAGES/DRESSINGS) ×2 IMPLANT
GLOVE SRG 8 PF TXTR STRL LF DI (GLOVE) ×2 IMPLANT
GLOVE SURG LTX SZ7 (GLOVE) ×4 IMPLANT
GLOVE SURG LTX SZ8 (GLOVE) ×4 IMPLANT
GLOVE SURG UNDER POLY LF SZ7 (GLOVE) ×4 IMPLANT
GLOVE SURG UNDER POLY LF SZ8 (GLOVE) ×3
GOWN STRL REUS W/ TWL LRG LVL3 (GOWN DISPOSABLE) ×6 IMPLANT
GOWN STRL REUS W/TWL LRG LVL3 (GOWN DISPOSABLE) ×9
KIT BASIN OR (CUSTOM PROCEDURE TRAY) ×4 IMPLANT
KIT STR SPEAR 1.8 FBRTK DISP (KITS) ×4 IMPLANT
KIT TURNOVER KIT B (KITS) ×4 IMPLANT
MANIFOLD NEPTUNE II (INSTRUMENTS) ×4 IMPLANT
NDL SCORPION MULTI FIRE (NEEDLE) IMPLANT
NDL SPNL 18GX3.5 QUINCKE PK (NEEDLE) ×2 IMPLANT
NDL SUT 6 .5 CRC .975X.05 MAYO (NEEDLE) IMPLANT
NEEDLE MAYO TAPER (NEEDLE)
NEEDLE SCORPION MULTI FIRE (NEEDLE) ×3 IMPLANT
NEEDLE SPNL 18GX3.5 QUINCKE PK (NEEDLE) ×3 IMPLANT
NS IRRIG 1000ML POUR BTL (IV SOLUTION) ×4 IMPLANT
PACK SHOULDER (CUSTOM PROCEDURE TRAY) ×4 IMPLANT
PAD ARMBOARD 7.5X6 YLW CONV (MISCELLANEOUS) ×8 IMPLANT
PORT APPOLLO RF 90DEGREE MULTI (SURGICAL WAND) ×2 IMPLANT
PROBE APOLLO 90XL (SURGICAL WAND) ×4 IMPLANT
RESTRAINT HEAD UNIVERSAL NS (MISCELLANEOUS) ×4 IMPLANT
RETRIEVER SUT HEWSON (MISCELLANEOUS) ×2 IMPLANT
SLING ARM IMMOBILIZER LRG (SOFTGOODS) ×2 IMPLANT
SPONGE T-LAP 4X18 ~~LOC~~+RFID (SPONGE) ×10 IMPLANT
SUCTION FRAZIER HANDLE 10FR (MISCELLANEOUS) ×3
SUCTION TUBE FRAZIER 10FR DISP (MISCELLANEOUS) ×2 IMPLANT
SUT ETHILON 3 0 PS 1 (SUTURE) ×4 IMPLANT
SUT FIBERWIRE #2 38 T-5 BLUE (SUTURE)
SUT MNCRL AB 3-0 PS2 18 (SUTURE) ×4 IMPLANT
SUT VIC AB 0 CT1 27 (SUTURE) ×3
SUT VIC AB 0 CT1 27XBRD ANBCTR (SUTURE) ×2 IMPLANT
SUT VIC AB 1 CT1 27 (SUTURE)
SUT VIC AB 1 CT1 27XBRD ANBCTR (SUTURE) IMPLANT
SUT VIC AB 2-0 CT1 27 (SUTURE) ×3
SUT VIC AB 2-0 CT1 TAPERPNT 27 (SUTURE) ×2 IMPLANT
SUT VICRYL 0 UR6 27IN ABS (SUTURE) ×6 IMPLANT
SUT VICRYL 1 TIES 12X18 (SUTURE) ×4 IMPLANT
SUTURE FIBERWR #2 38 T-5 BLUE (SUTURE) IMPLANT
SUTURE TAPE 1.3 FIBERLOP 20 ST (SUTURE) IMPLANT
SUTURETAPE 1.3 FIBERLOOP 20 ST (SUTURE)
SYS FBRTK BUTTON 2.6 (Anchor) ×3 IMPLANT
SYSTEM FBRTK BUTTON 2.6 (Anchor) IMPLANT
TOWEL GREEN STERILE (TOWEL DISPOSABLE) ×4 IMPLANT
TOWEL GREEN STERILE FF (TOWEL DISPOSABLE) ×4 IMPLANT
TUBING ARTHROSCOPY IRRIG 16FT (MISCELLANEOUS) ×4 IMPLANT

## 2021-02-05 NOTE — H&P (Signed)
Tony Nguyen is an 60 y.o. male.   Chief Complaint: right shoulder pain HPI:  Patient presents for evaluation of right shoulder pain.  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  Felt a pop in the right shoulder.  He reports that the pain wakes him from sleep at night.  He is right-hand dominant.  Reports relatively constant pain in the right shoulder with decreased range of motion.  He has not had an injection.  He has been taking some over-the-counter medications.  Nighttime is worse than daytime.  MRI scan does show supraspinatus tendon tear with minimal retraction.  He also has fairly significant spur of the acromion.  Biceps tendinitis also present.  Subscap intact.  Intrinsic degeneration of the infraspinatus present  Past Medical History:  Diagnosis Date   Bug bite 08/09/2018   Duodenal ulcer due to Helicobacter pylori 09/25/2010   EGD confirmed that duodenal ulcers and H. pylori gastritis in 08/2009. Treated with triple  therapy at [facility].    Gallbladder polyp 09/25/2010   GERD (gastroesophageal reflux disease)    Major depression in remission (HCC)    OTHER DISEASES OF NASAL CAVITY AND SINUSES 02/23/2008   Qualifier: Diagnosis of  By: Daphine Deutscher MD, [facility]     RHINITIS, CHRONIC 04/16/2006   Qualifier: Diagnosis of  By: Bebe Shaggy     Screening for prostate cancer 08/12/2019   TENNIS ELBOW 04/16/2006   Qualifier: Diagnosis of  By: Bebe Shaggy     Tinea corporis 06/01/2017    Past Surgical History:  Procedure Laterality Date   APPENDECTOMY  1981   CHOLECYSTECTOMY  10/15/10   Path results revealed chronic cholecystitis   ELBOW SURGERY  2005   right   UPPER GASTROINTESTINAL ENDOSCOPY      Family History  Problem Relation Age of Onset   High blood pressure Father    Kidney disease Cousin    Diabetes Neg Hx    Early death Neg Hx    Heart disease Neg Hx    Hyperlipidemia Neg Hx    Hypertension Neg Hx    Social  History:  reports that he has never smoked. He has never used smokeless tobacco. He reports that he does not drink alcohol and does not use drugs.  Allergies:  Allergies  Allergen Reactions   Pork-Derived Products     Religious preference     Medications Prior to Admission  Medication Sig Dispense Refill   ibuprofen (ADVIL) 800 MG tablet Take 800 mg by mouth every 8 (eight) hours as needed for moderate pain.     cyclobenzaprine (FLEXERIL) 10 MG tablet Take 1 tablet (10 mg total) by mouth at bedtime. (Patient not taking: Reported on 01/28/2021) 10 tablet 0   Diclofenac Sodium 3 % GEL Apply 1 application topically 4 (four) times daily as needed. (Patient not taking: Reported on 01/28/2021) 100 g 0   naproxen (NAPROSYN) 500 MG tablet Take 1 tablet (500 mg total) by mouth 2 (two) times daily with a meal. (Patient not taking: Reported on 01/28/2021) 30 tablet 0   sildenafil (VIAGRA) 100 MG tablet Take 0.5-1 tablets (50-100 mg total) by mouth daily as needed for erectile dysfunction. 5 tablet 11    No results found for this or any previous visit (from the past 48 hour(s)). No results found.  Review of Systems  Musculoskeletal:  Positive for arthralgias.  All other systems reviewed and are negative.  Blood pressure (!) 144/96,  pulse 71, temperature 98.2 F (36.8 C), temperature source Oral, resp. rate 11, height 6' (1.829 m), weight 117.9 kg, SpO2 100 %. Physical Exam Vitals reviewed.  HENT:     Head: Normocephalic.     Nose: Nose normal.     Mouth/Throat:     Mouth: Mucous membranes are moist.  Eyes:     Pupils: Pupils are equal, round, and reactive to light.  Cardiovascular:     Rate and Rhythm: Normal rate.     Pulses: Normal pulses.  Pulmonary:     Effort: Pulmonary effort is normal.  Abdominal:     General: Abdomen is flat.  Musculoskeletal:     Cervical back: Normal range of motion.  Skin:    General: Skin is warm.     Capillary Refill: Capillary refill takes less than 2  seconds.  Neurological:     General: No focal deficit present.     Mental Status: He is alert.  Psychiatric:        Mood and Affect: Mood normal.   Ortho exam demonstrates 5 out of 5 EPL FPL interosseous strength.  Neck range of motion intact.  He does have symmetric external rotation at 15 degrees of abduction to about 50 degrees bilaterally.  Isolated glenohumeral abduction is about 75 on the right 85 on the left.  Forward flexion is about 175 on the left and 160 on the right.  Does have infraspinatus supraspinatus weakness on the right to manual motor testing at 4 out of 5.  Does have pain radiating into the biceps region as well.  No discrete AC joint tenderness.  Assessment/Plan  Impression is 5 months symptomatic right shoulder rotator cuff tear with possible early adhesive capsulitis.  Does have weakness on exam.  Plan is right shoulder arthroscopy with biceps tendon release biceps tenodesis mini open rotator cuff tear repair as well as subacromial decompression.  Risk and benefits are discussed with the patient including not limited to infection nerve vessel damage shoulder stiffness.  Would like for him to use shoulder CPM for at least 3 weeks after the procedure.  Anticipate at least 3 months out of work from heavy lifting types of activities.  Patient understands risk benefits and wishes to proceed.  He has been working since Programmer, systems.  All questions answered  Burnard Bunting, MD 02/05/2021, 11:00 AM

## 2021-02-05 NOTE — Anesthesia Procedure Notes (Addendum)
Procedure Name: Intubation Date/Time: 02/05/2021 11:18 AM Performed by: Shary Decamp, CRNA Pre-anesthesia Checklist: Patient identified, Patient being monitored, Timeout performed, Emergency Drugs available and Suction available Patient Re-evaluated:Patient Re-evaluated prior to induction Oxygen Delivery Method: Circle system utilized Preoxygenation: Pre-oxygenation with 100% oxygen Induction Type: IV induction and Rapid sequence Ventilation: Mask ventilation without difficulty Laryngoscope Size: Miller and 3 Grade View: Grade II Tube type: Oral Tube size: 8.0 mm Number of attempts: 1 Airway Equipment and Method: Stylet Placement Confirmation: ETT inserted through vocal cords under direct vision, positive ETCO2 and breath sounds checked- equal and bilateral Secured at: 23 cm Tube secured with: Tape Dental Injury: Teeth and Oropharynx as per pre-operative assessment

## 2021-02-05 NOTE — Transfer of Care (Signed)
Immediate Anesthesia Transfer of Care Note  Patient: Tony Nguyen  Procedure(s) Performed: RIGHT SHOULDER ARTHROSCOPY, SUBACROMIAL DECOMPRESSION, DEBRIDEMENT, MINI OPEN ROTATOR CUFF TEAR REPAIR, BICEPS TENODESIS (Right)  Patient Location: PACU  Anesthesia Type:General  Level of Consciousness: awake, alert  and responds to stimulation  Airway & Oxygen Therapy: Patient Spontanous Breathing and Patient connected to nasal cannula oxygen  Post-op Assessment: Report given to RN and Post -op Vital signs reviewed and stable  Post vital signs: Reviewed and stable  Last Vitals:  Vitals Value Taken Time  BP 140/91 02/05/21 1431  Temp 36.4 C 02/05/21 1415  Pulse 71 02/05/21 1443  Resp 17 02/05/21 1443  SpO2 94 % 02/05/21 1443  Vitals shown include unvalidated device data.  Last Pain:  Vitals:   02/05/21 1415  TempSrc:   PainSc: Asleep         Complications: No notable events documented.

## 2021-02-05 NOTE — Brief Op Note (Signed)
° °  02/05/2021  1:30 PM  PATIENT:  Tony Nguyen  60 y.o. male  PRE-OPERATIVE DIAGNOSIS:  right shoulder rotator cuff tear, biceps tendonitis  POST-OPERATIVE DIAGNOSIS:  right shoulder rotator cuff tear, biceps tendonitis  PROCEDURE:  Procedure(s): RIGHT SHOULDER ARTHROSCOPY, SUBACROMIAL DECOMPRESSION, DEBRIDEMENT, MINI OPEN ROTATOR CUFF TEAR REPAIR, BICEPS TENODESIS  SURGEON:  Surgeon(s): August Saucer, Corrie Mckusick, MD  ASSISTANT: magnant pa  ANESTHESIA:   general  EBL: 25 ml    Total I/O In: 150 [IV Piggyback:150] Out: -   BLOOD ADMINISTERED: none  DRAINS: none   LOCAL MEDICATIONS USED:  none  SPECIMEN:  No Specimen  COUNTS:  YES  TOURNIQUET:  * No tourniquets in log *  DICTATION: .Other Dictation: Dictation Number [id number]  PLAN OF CARE: Discharge to home after PACU  PATIENT DISPOSITION:  PACU - hemodynamically stable

## 2021-02-05 NOTE — Anesthesia Preprocedure Evaluation (Signed)
Anesthesia Evaluation  Patient identified by MRN, date of birth, ID band Patient awake    Reviewed: Allergy & Precautions, H&P , NPO status , Patient's Chart, lab work & pertinent test results, reviewed documented beta blocker date and time   Airway Mallampati: II  TM Distance: >3 FB Neck ROM: full    Dental no notable dental hx.    Pulmonary neg pulmonary ROS,    Pulmonary exam normal breath sounds clear to auscultation       Cardiovascular Exercise Tolerance: Good negative cardio ROS   Rhythm:regular Rate:Normal     Neuro/Psych PSYCHIATRIC DISORDERS Depression negative neurological ROS     GI/Hepatic Neg liver ROS, PUD, GERD  Medicated,  Endo/Other  Morbid obesity  Renal/GU negative Renal ROS  negative genitourinary   Musculoskeletal  (+) Arthritis , Osteoarthritis,    Abdominal   Peds  Hematology negative hematology ROS (+)   Anesthesia Other Findings   Reproductive/Obstetrics negative OB ROS                           Anesthesia Physical Anesthesia Plan  ASA: 3  Anesthesia Plan: General and Regional   Post-op Pain Management: Minimal or no pain anticipated   Induction: Intravenous  PONV Risk Score and Plan: 2 and Ondansetron, Dexamethasone and Midazolam  Airway Management Planned: Oral ETT  Additional Equipment: None  Intra-op Plan:   Post-operative Plan:   Informed Consent: I have reviewed the patients History and Physical, chart, labs and discussed the procedure including the risks, benefits and alternatives for the proposed anesthesia with the patient or authorized representative who has indicated his/her understanding and acceptance.     Dental Advisory Given  Plan Discussed with: CRNA and Anesthesiologist  Anesthesia Plan Comments:        Anesthesia Quick Evaluation

## 2021-02-05 NOTE — Anesthesia Postprocedure Evaluation (Signed)
Anesthesia Post Note  Patient: Tony Nguyen  Procedure(s) Performed: RIGHT SHOULDER ARTHROSCOPY, SUBACROMIAL DECOMPRESSION, DEBRIDEMENT, MINI OPEN ROTATOR CUFF TEAR REPAIR, BICEPS TENODESIS (Right)     Patient location during evaluation: PACU Anesthesia Type: Regional and General Level of consciousness: awake and alert Pain management: pain level controlled Vital Signs Assessment: post-procedure vital signs reviewed and stable Respiratory status: spontaneous breathing, nonlabored ventilation, respiratory function stable and patient connected to nasal cannula oxygen Cardiovascular status: blood pressure returned to baseline and stable Postop Assessment: no apparent nausea or vomiting Anesthetic complications: no   No notable events documented.  Last Vitals:  Vitals:   02/05/21 1515 02/05/21 1530  BP: 133/76 132/76  Pulse: 71 71  Resp: 16 15  Temp:  36.4 C  SpO2: 93% 94%    Last Pain:  Vitals:   02/05/21 1415  TempSrc:   PainSc: Asleep                 Melvin Whiteford

## 2021-02-05 NOTE — Anesthesia Procedure Notes (Signed)
Anesthesia Regional Block: Interscalene brachial plexus block   Pre-Anesthetic Checklist: , timeout performed,  Correct Patient, Correct Site, Correct Laterality,  Correct Procedure, Correct Position, site marked,  Risks and benefits discussed,  Surgical consent,  Pre-op evaluation,  At surgeon's request and post-op pain management  Laterality: Right  Prep: chloraprep       Needles:  Injection technique: Single-shot  Needle Type: Echogenic Stimulator Needle     Needle Length: 5cm  Needle Gauge: 22     Additional Needles:   Procedures:, nerve stimulator,,, ultrasound used (permanent image in chart),,     Nerve Stimulator or Paresthesia:  Response: hand, 0.45 mA  Additional Responses:   Narrative:  Start time: 02/05/2021 10:15 AM End time: 02/05/2021 10:21 AM Injection made incrementally with aspirations every 5 mL.  Performed by: Personally  Anesthesiologist: Bethena Midget, MD  Additional Notes: Functioning IV was confirmed and monitors were applied.  A 56mm 22ga Arrow echogenic stimulator needle was used. Sterile prep and drape,hand hygiene and sterile gloves were used. Ultrasound guidance: relevant anatomy identified, needle position confirmed, local anesthetic spread visualized around nerve(s)., vascular puncture avoided.  Image printed for medical record. Negative aspiration and negative test dose prior to incremental administration of local anesthetic. The patient tolerated the procedure well.

## 2021-02-06 NOTE — Op Note (Signed)
NAMEESTHER, BROYLES MEDICAL RECORD NO: [id number] ACCOUNT NO: [id number] DATE OF BIRTH: 22-Jun-1960 FACILITY: [facility] LOCATION: [facility] PHYSICIAN: Graylin Shiver. August Saucer, MD  Operative Report   DATE OF PROCEDURE: 02/05/2021  PREOPERATIVE DIAGNOSES:  Right shoulder degenerative superior labral tear and rotator cuff tear.  POSTOPERATIVE DIAGNOSES:  Right shoulder degenerative superior labral tear and rotator cuff tear.  PROCEDURE:  Right shoulder arthroscopy with limited debridement of the superior labrum, biceps tendon release, biceps tenodesis and mini open rotator cuff tear repair.  SURGEON:  Graylin Shiver. August Saucer, MD  ASSISTANT:  Karenann Cai, PA  INDICATIONS:  Darry Kelnhofer is a 60 year old patient with right shoulder pain following an injury 5 months ago.  He has rotator cuff tear by MRI scan and presents for operative management after explanation of risks and benefits.  PROCEDURE IN DETAIL:  The patient was brought to the operating room where general anesthetic was induced.  Preoperative antibiotics administered.  Timeout was called.  The patient was placed in the beach chair position with the head in neutral position.   Right shoulder, arm and hand pre-scrubbed with alcohol, Betadine, and allowed to air dry. Prepped with DuraPrep solution and draped in a sterile manner.  Ioban used to cover the operative field including the axilla.  Posterior portal was created,  anterior portal created under direct visualization.  Diagnostic arthroscopy was performed.  The patient did have degenerative superior labral tear type 2.  Superior labrum was debrided with the Arthrocare wand.  Anterior inferior, posterior inferior  glenohumeral ligaments intact.  Glenohumeral articular surfaces intact.  Rotator cuff tear was visualized measuring about 1 x 2 cm.  Anterior leading edge of the supraspinatus, which matched the MRI scan.  Following biceps tendon release and limited  debridement of that superior  labrum, the instruments were removed.  Portal was closed using 3-0 nylon.  Ioban then used to cover the entire operative field. An incision made off the anterolateral margin of the acromion.  Skin and subcutaneous tissue were  sharply divided.  The deltoid was marked with #1 Vicryl suture about 4 cm from the anterolateral margin of the acromion between the anterior middle raphae.  The deltoid was split at measured distance of 4 cm.  Bursectomy performed.  The bursa was very  thick.  A subacromial decompression was performed with the rasp.  Biceps tendon was then tenodesed into the bicipital groove under appropriate tension using distally a 1.9 Arthrex SutureTak with luggage tag suture fixation distally and proximally.  I  used a 3.2 SutureTak with 2 arms, which gave excellent fixation of the proximal piece.  This was a knotless SutureTak on both mechanisms.  The tendon was then oversewn using 2 Vicryl sutures.  Secured fixation under appropriate tension was achieved.   Next, attention was directed towards the rotator cuff tear.  Rotator cuff tear was present.  Footprint was debrided and prepared.  A single Arthrex double-loaded SutureTape all-suture anchor was tapped in at the apex of the tear at the medial edge of the  greater tuberosity landing zone.  Using a Scorpion suture passer, the 4 limbs were passed equidistant within the rotator cuff.  It should be noted that 2 Vicryl sutures were placed in grasping Mason-Allen fashion at the edge of the tendon tear.  The  tear was then reduced to the footprint.  The sutures were tied posterior to anterior and crossed.  Then, 6 sutures were placed into the SwiveLock anteriorly and posteriorly with the arm in abduction.  Watertight  repair was achieved.  Thorough irrigation  was performed.  Self-retaining retractors were removed.  Deltoid split was closed using #1 Vicryl suture followed by interrupted inverted 0 Vicryl suture, 2-0 Vicryl suture, and 3-0 Monocryl.   Steri-Strips and impervious dressings applied.  Iceman  applied to the shoulder.  The patient tolerated the procedure well without immediate complications.  Luke's assistance was required at all times for opening, closing and mobilization of tissue.  His assistance was a medical necessity.   VAI D: 02/05/2021 1:44:31 pm T: 02/06/2021 12:41:00 am  JOB: [id number]/ [id number]

## 2021-02-07 ENCOUNTER — Encounter (HOSPITAL_COMMUNITY): Payer: Self-pay | Admitting: Orthopedic Surgery

## 2021-02-11 DIAGNOSIS — M7521 Bicipital tendinitis, right shoulder: Secondary | ICD-10-CM

## 2021-02-11 DIAGNOSIS — M75121 Complete rotator cuff tear or rupture of right shoulder, not specified as traumatic: Secondary | ICD-10-CM

## 2021-02-11 DIAGNOSIS — S43431A Superior glenoid labrum lesion of right shoulder, initial encounter: Secondary | ICD-10-CM

## 2021-02-18 ENCOUNTER — Emergency Department (HOSPITAL_COMMUNITY): Payer: No Typology Code available for payment source

## 2021-02-18 ENCOUNTER — Encounter (HOSPITAL_COMMUNITY): Payer: Self-pay | Admitting: Emergency Medicine

## 2021-02-18 ENCOUNTER — Other Ambulatory Visit: Payer: Self-pay

## 2021-02-18 ENCOUNTER — Emergency Department (HOSPITAL_COMMUNITY)
Admission: EM | Admit: 2021-02-18 | Discharge: 2021-02-19 | Disposition: A | Discharge status: Home / Self Care | Payer: No Typology Code available for payment source | Attending: Emergency Medicine | Admitting: Emergency Medicine

## 2021-02-18 DIAGNOSIS — W1839XA Other fall on same level, initial encounter: Secondary | ICD-10-CM | POA: Insufficient documentation

## 2021-02-18 DIAGNOSIS — R000 Tachycardia, unspecified: Secondary | ICD-10-CM | POA: Diagnosis not present

## 2021-02-18 DIAGNOSIS — R519 Headache, unspecified: Secondary | ICD-10-CM | POA: Insufficient documentation

## 2021-02-18 DIAGNOSIS — J189 Pneumonia, unspecified organism: Secondary | ICD-10-CM

## 2021-02-18 DIAGNOSIS — W19XXXA Unspecified fall, initial encounter: Secondary | ICD-10-CM

## 2021-02-18 DIAGNOSIS — Z79899 Other long term (current) drug therapy: Secondary | ICD-10-CM | POA: Insufficient documentation

## 2021-02-18 DIAGNOSIS — R059 Cough, unspecified: Secondary | ICD-10-CM

## 2021-02-18 DIAGNOSIS — S0990XA Unspecified injury of head, initial encounter: Secondary | ICD-10-CM

## 2021-02-18 DIAGNOSIS — R531 Weakness: Secondary | ICD-10-CM | POA: Insufficient documentation

## 2021-02-18 DIAGNOSIS — Z20822 Contact with and (suspected) exposure to covid-19: Secondary | ICD-10-CM | POA: Diagnosis not present

## 2021-02-18 LAB — BASIC METABOLIC PANEL
Anion gap: 10 (ref 5–15)
BUN: 18 mg/dL (ref 6–20)
CO2: 21 mmol/L — ABNORMAL LOW (ref 22–32)
Calcium: 9.2 mg/dL (ref 8.9–10.3)
Chloride: 102 mmol/L (ref 98–111)
Creatinine, Ser: 1.34 mg/dL — ABNORMAL HIGH (ref 0.61–1.24)
GFR, Estimated: >60 mL/min (ref >60)
Glucose, Bld: 158 mg/dL — ABNORMAL HIGH (ref 70–99)
Potassium: 4 mmol/L (ref 3.5–5.1)
Sodium: 133 mmol/L — ABNORMAL LOW (ref 135–145)

## 2021-02-18 LAB — CBC
HCT: 40.4 % (ref 39.0–52.0)
Hemoglobin: 13.5 g/dL (ref 13.0–17.0)
MCH: 29.3 pg (ref 26.0–34.0)
MCHC: 33.4 g/dL (ref 30.0–36.0)
MCV: 87.8 fL (ref 80.0–100.0)
Platelets: 278 10*3/uL (ref 150–400)
RBC: 4.6 MIL/uL (ref 4.22–5.81)
RDW: 12.7 % (ref 11.5–15.5)
WBC: 12.4 10*3/uL — ABNORMAL HIGH (ref 4.0–10.5)
nRBC: 0 % (ref 0.0–0.2)

## 2021-02-18 MED ORDER — SODIUM CHLORIDE 0.9 % IV BOLUS
1000.0000 mL | Freq: Once | INTRAVENOUS | Status: AC
  Administered 2021-02-19: 1000 mL via INTRAVENOUS

## 2021-02-18 NOTE — ED Triage Notes (Signed)
Patient BIB [facility] from home, complaint of fall and weakness that occurred last night, patient with recent shoulder surgery. Pt fell unsure LOC last night. Weakness ongoing. VSS.

## 2021-02-18 NOTE — ED Notes (Signed)
Pt ambulating in hallway to restroom, does c/o some dizziness. Steady gait.

## 2021-02-18 NOTE — ED Provider Notes (Signed)
[facility] EMERGENCY DEPARTMENT Provider Note   CSN: [id number] Arrival date & time: 02/18/21  1458     History  Chief Complaint  Patient presents with   Fall   Weakness    Tony Nguyen is a 61 y.o. male.  Pt reports he had shoulder surgery on 12/20.  Pt reports he fell yesterday while taking a shower and hit his head. Pt complains of a headache and decreased hearing left ear.  Pt's daughter does not think pt lost conciousness   Pt reports he has felt cold and feel weak.  Pt denies cough or congestion  The history is provided by the patient. No language interpreter was used.  Fall This is a new problem. The current episode started yesterday. The problem occurs constantly. The problem has not changed since onset.Associated symptoms include headaches. Pertinent negatives include no chest pain and no abdominal pain. Nothing aggravates the symptoms. He has tried nothing for the symptoms. The treatment provided no relief.  Weakness Associated symptoms: headaches   Associated symptoms: no abdominal pain and no chest pain       Home Medications Prior to Admission medications   Medication Sig Start Date End Date Taking? Authorizing Provider  acetaminophen (TYLENOL) 500 MG tablet Take 500 mg by mouth every 6 (six) hours as needed for mild pain.   Yes [provider]  ibuprofen (ADVIL) 800 MG tablet Take 800 mg by mouth every 8 (eight) hours as needed for moderate pain.   Yes [provider]  methocarbamol (ROBAXIN) 500 MG tablet Take 1 tablet (500 mg total) by mouth every 8 (eight) hours as needed. Patient taking differently: Take 500 mg by mouth every 8 (eight) hours as needed for muscle spasms. 02/05/21  Yes Magnant, Joycie Peek, PA-C  oxyCODONE-acetaminophen (PERCOCET) 5-325 MG tablet Take 1 tablet by mouth every 4 (four) hours as needed for severe pain. 02/05/21 02/05/22 Yes Magnant, Charles L, PA-C  sildenafil (VIAGRA) 100 MG tablet Take  0.5-1 tablets (50-100 mg total) by mouth daily as needed for erectile dysfunction. 08/27/20  Yes Brimage, Seward Meth, DO      Allergies    Pork-derived products    Review of Systems   Review of Systems  Cardiovascular:  Negative for chest pain.  Gastrointestinal:  Negative for abdominal pain.  Neurological:  Positive for weakness and headaches.  All other systems reviewed and are negative.  Physical Exam Updated Vital Signs BP (!) 145/87    Pulse (!) 119    Temp 98.6 F (37 C) (Oral)    Resp 18    SpO2 96%  Physical Exam Vitals reviewed.  Constitutional:      Appearance: Normal appearance.  HENT:     Head: Normocephalic and atraumatic.     Right Ear: Tympanic membrane normal.     Left Ear: Tympanic membrane normal.     Mouth/Throat:     Mouth: Mucous membranes are moist.  Eyes:     Extraocular Movements: Extraocular movements intact.     Pupils: Pupils are equal, round, and reactive to light.  Cardiovascular:     Rate and Rhythm: Regular rhythm. Tachycardia present.  Pulmonary:     Effort: Pulmonary effort is normal.  Abdominal:     General: Abdomen is flat.     Palpations: Abdomen is soft.  Musculoskeletal:        General: Normal range of motion.     Cervical back: Normal range of motion.     Comments: Decreased  range of motion right shoulder   Skin:    General: Skin is warm.  Neurological:     General: No focal deficit present.     Mental Status: He is alert.  Psychiatric:        Mood and Affect: Mood normal.    ED Results / Procedures / Treatments   Labs (all labs ordered are listed, but only abnormal results are displayed) Labs Reviewed  BASIC METABOLIC PANEL - Abnormal; Notable for the following components:      Result Value   Sodium 133 (*)    CO2 21 (*)    Glucose, Bld 158 (*)    Creatinine, Ser 1.34 (*)    All other components within normal limits  CBC - Abnormal; Notable for the following components:   WBC 12.4 (*)    All other components within  normal limits  RESP PANEL BY RT-PCR (FLU A&B, COVID) ARPGX2  URINALYSIS, ROUTINE W REFLEX MICROSCOPIC  CBG MONITORING, ED    EKG EKG Interpretation  Date/Time:  Monday February 18 2021 15:19:03 EST Ventricular Rate:  124 PR Interval:  132 QRS Duration: 92 QT Interval:  310 QTC Calculation: 445 R Axis:   -42 Text Interpretation: Sinus tachycardia Left axis deviation Cannot rule out Anterior infarct , age undetermined Abnormal ECG When compared with ECG of 01-May-2015 22:14, PREVIOUS ECG IS PRESENT No significant change since last tracing Confirmed by Melene Plan [phone]) on 02/18/2021 10:34:48 PM  Radiology DG Shoulder Right  Result Date: 02/18/2021 CLINICAL DATA:  Status post fall with right shoulder pain. Patient states she had recent shoulder surgery. EXAM: RIGHT SHOULDER - 2+ VIEW COMPARISON:  None. FINDINGS: There is no evidence of fracture or dislocation. There is no evidence of arthropathy or other focal bone abnormality. Soft tissues are unremarkable. IMPRESSION: Negative. Electronically Signed   By: Sherian Rein M.D.   On: 02/18/2021 15:45   CT HEAD WO CONTRAST  Result Date: 02/18/2021 CLINICAL DATA:  Fall.  Patient struck his head. EXAM: CT HEAD WITHOUT CONTRAST TECHNIQUE: Contiguous axial images were obtained from the base of the skull through the vertex without intravenous contrast. COMPARISON:  05/27/2007 FINDINGS: Brain: No evidence of acute infarction, hemorrhage, hydrocephalus, extra-axial collection or mass lesion/mass effect. Vascular: No hyperdense vessel or unexpected calcification. Skull: Normal. Negative for fracture or focal lesion. Sinuses/Orbits: Normal globes and orbits. Single opacified right ethmoid air cell. Remaining sinuses are clear. Other: No visualized scalp contusion/hematoma. IMPRESSION: 1. No intracranial abnormality.  No skull fracture. Electronically Signed   By: Amie Portland M.D.   On: 02/18/2021 16:00    Procedures Procedures    Medications Ordered in  ED Medications - No data to display  ED Course/ Medical Decision Making/ A&P                           Medical Decision Making  MDM:  Head ct no acute abnormality,   Pt's care turned over to [facility]  covid and influenza pending        Final Clinical Impression(s) / ED Diagnoses Final diagnoses:  None    Rx / DC Orders ED Discharge Orders     None         Osie Cheeks 02/18/21 2355    Melene Plan, DO 02/19/21 1501

## 2021-02-18 NOTE — ED Provider Notes (Signed)
Emergency Medicine Provider Triage Evaluation Note  Tony Nguyen , a 61 y.o. male  was evaluated in triage.  Pt complains of a possible syncope episode while taking a bath yesterday.  Patient states he either lost balance or lost consciousness and fell backwards in the tub striking his head.  Since then he has been having severe headache and chills.  No focal weakness or numbness.  He is 2 weeks status post right shoulder arthroplasty.  Review of Systems  Positive:  Negative: See above   Physical Exam  BP 138/80    Pulse (!) 124    Temp 98.6 F (37 C) (Oral)    Resp 18    SpO2 96%  Gen:   Awake, no distress   Resp:  Normal effort  MSK:   Moves extremities without difficulty  Other:  No evidence of significant erythema, weeping, or purulence from the right shoulder.  Patient is tachycardic.  Medical Decision Making  Medically screening exam initiated at 3:28 PM.  Appropriate orders placed.  Abdul-Rashid I Watkinson was informed that the remainder of the evaluation will be completed by another provider, this initial triage assessment does not replace that evaluation, and the importance of remaining in the ED until their evaluation is complete.     Honor Loh Winton, PA-C 02/18/21 1529    Gloris Manchester, MD 02/20/21 [phone]

## 2021-02-19 ENCOUNTER — Emergency Department (HOSPITAL_COMMUNITY): Payer: No Typology Code available for payment source

## 2021-02-19 LAB — RESP PANEL BY RT-PCR (FLU A&B, COVID) ARPGX2
Influenza A by PCR: NEGATIVE
Influenza B by PCR: NEGATIVE
SARS Coronavirus 2 by RT PCR: NEGATIVE

## 2021-02-19 MED ORDER — AZITHROMYCIN 250 MG PO TABS: ORAL_TABLET | ORAL | 0 refills | Status: DC

## 2021-02-19 MED ORDER — ACETAMINOPHEN 325 MG PO TABS
650.0000 mg | ORAL_TABLET | Freq: Once | ORAL | Status: AC
  Administered 2021-02-19: 650 mg via ORAL
  Filled 2021-02-19: qty 2

## 2021-02-19 NOTE — Discharge Instructions (Signed)
You came to the emergency department today to be evaluated for your headache and left ear issues after suffering a fall.  Your physical exam was reassuring.  The CT scan of your head showed no acute abnormalities.  The chest x-ray obtained showed that you have pneumonia.  Due to this she was started on antibiotics.  The pneumonia is likely the cause of your fever.  You can alternate Tylenol/acetaminophen and Advil/ibuprofen/Motrin every 4 hours for sore throat, body aches, headache or fever.  Do not take more than 3,000 mg tylenol in a 24 hour period.  Please check all medication labels as many medications such as pain and cold medications may contain tylenol.  Do not drink alcohol while taking these medications.  Do not take other NSAID'S while taking ibuprofen (such as aleve or naproxen).  Please take ibuprofen with food to decrease stomach upset.  If the ringing sensation in her ear does not improve please follow-up with the ear nose and throat doctor listed on this paperwork.  Get help right away if: You have: A severe headache that is not helped by medicine. Trouble walking or weakness in your arms and legs. Clear or bloody fluid coming from your nose or ears. Changes in your vision. A seizure. Increased confusion or irritability. Your symptoms get worse. You are sleepier than normal and have trouble staying awake. You lose your balance. Your pupils change size. Your speech is slurred. Your dizziness gets worse. You vomit. Your shortness of breath becomes worse. Your chest pain increases. Your sickness becomes worse, especially if you are an older adult or have a weak immune system. You cough up blood.

## 2021-02-19 NOTE — ED Notes (Signed)
Patient transported to X-ray 

## 2021-02-19 NOTE — ED Provider Notes (Signed)
°  Care of patient assumed from Tony Nguyen at 2350.  Agree with history, physical exam and plan.  See their note for further details. Briefly, patient presents emergency department with a chief complaint of headache and decreased hearing to left ear after suffering a fall yesterday.  Patient reports that he had shoulder surgery on 12/20.   Physical Exam  BP (!) 145/87    Pulse (!) 119    Temp 100.2 F (37.9 C) (Oral)    Resp 18    SpO2 96%   Physical Exam Vitals and nursing note reviewed.  Constitutional:      General: He is not in acute distress.    Appearance: He is not ill-appearing, toxic-appearing or diaphoretic.  HENT:     Head: Normocephalic.  Eyes:     General: No scleral icterus.       Right eye: No discharge.        Left eye: No discharge.  Cardiovascular:     Rate and Rhythm: Tachycardia present.  Pulmonary:     Effort: Pulmonary effort is normal.  Skin:    General: Skin is warm and dry.  Neurological:     General: No focal deficit present.     Mental Status: He is alert.     GCS: GCS eye subscore is 4. GCS verbal subscore is 5. GCS motor subscore is 6.  Psychiatric:        Behavior: Behavior is cooperative.    ED Course/Procedures     Procedures  MDM   Patient had CT scan of his head which showed no acute intracranial abnormality. X-ray imaging of right shoulder shows no acute osseous abnormality.  BMP shows creatinine elevated at 1.34. CBC shows leukocytosis 12.4  Noted to have persistent tachycardia.  Temperature of 100.2 At time of handoff COVID-19/influenza test pending.  Patient received Tylenol for his fever as well as fluid bolus.  Chest x-ray shows patchy airspace disease at the lung bases possible atelectasis or infiltrate.  Due to fever, elevated white count and concern for possible infiltrate on x-ray we will start patient on azithromycin.  Patient has improvement in his tachycardia after receiving 1 L fluid bolus and Tylenol.  Patient reports that  he feels much better.  Patient was offered further fluid bolus however declines at this time.  The patient is reports of tinnitus we will give him information to follow-up with ENT in the outpatient setting.  Patient to follow-up with PCP if symptoms do not improve.  Patient care was discussed with attending physician Dr. Wilkie Aye.  Discussed results, findings, treatment and follow up. Patient advised of return precautions. Patient verbalized understanding and agreed with plan.        Haskel Schroeder, PA-C 02/19/21 0154    Shon Baton, MD 02/19/21 [phone]

## 2021-02-20 ENCOUNTER — Ambulatory Visit (INDEPENDENT_AMBULATORY_CARE_PROVIDER_SITE_OTHER): Payer: No Typology Code available for payment source | Admitting: Orthopedic Surgery

## 2021-02-20 ENCOUNTER — Other Ambulatory Visit: Payer: Self-pay

## 2021-02-20 DIAGNOSIS — S46011D Strain of muscle(s) and tendon(s) of the rotator cuff of right shoulder, subsequent encounter: Secondary | ICD-10-CM

## 2021-02-20 MED ORDER — ASPIRIN EC 325 MG PO TBEC: 325.0000 mg | DELAYED_RELEASE_TABLET | Freq: Every day | ORAL | 0 refills | Status: DC

## 2021-02-20 MED ORDER — ONDANSETRON HCL 4 MG PO TABS: 4.0000 mg | ORAL_TABLET | Freq: Three times a day (TID) | ORAL | 0 refills | Status: DC | PRN

## 2021-02-22 ENCOUNTER — Encounter: Payer: Self-pay | Admitting: Orthopedic Surgery

## 2021-02-22 NOTE — Progress Notes (Signed)
Post-Op Visit Note   Patient: Tony Nguyen           Date of Birth: 1961-01-09           MRN: [id number] Visit Date: 02/20/2021 PCP: Katha Cabal, DO   Assessment & Plan:  Chief Complaint:  Chief Complaint  Patient presents with   Right Shoulder - Routine Post Op   Visit Diagnoses:  1. Traumatic complete tear of right rotator cuff, subsequent encounter     Plan: Patient presents now 2 weeks out right shoulder arthroscopy with subacromial decompression mini open rotator cuff tear repair and biceps tenodesis.  Shoulder feels okay.  He was recently diagnosed with possible early pneumonia but has not picked up this prescription yet for antibiotics.  Reports some fevers and nausea.  On examination the shoulder incision looks good.  No coarse grinding or crepitus.  He is at 90 degrees on CPM.  On him to start physical therapy next week here.  Zofran prescribed for nausea.  I will have him use an aspirin 325 mg a day for 7 days.  No calf tenderness today.  Therapy is for range of motion and isometric strengthening 3 times a week for 6 weeks.  2-week return for clinical recheck.  Follow-Up Instructions: Return in about 2 weeks (around 03/06/2021).   Orders:  Orders Placed This Encounter  Procedures   Ambulatory referral to Physical Therapy   Meds ordered this encounter  Medications   ondansetron (ZOFRAN) 4 MG tablet    Sig: Take 1 tablet (4 mg total) by mouth every 8 (eight) hours as needed for nausea or vomiting.    Dispense:  20 tablet    Refill:  0   aspirin EC 325 MG tablet    Sig: Take 1 tablet (325 mg total) by mouth daily.    Dispense:  30 tablet    Refill:  0    Imaging: No results found.  PMFS History: Patient Active Problem List   Diagnosis Date Noted   Complete tear of right rotator cuff    Biceps tendonitis on right    Degenerative superior labral anterior-to-posterior (SLAP) tear of right shoulder    Acute pain of right shoulder 01/11/2020   Blood  pressure check 10/20/2019   Mood changes 04/12/2019   Acute midline low back pain without sciatica 02/13/2019   Erectile dysfunction 07/23/2018   Ganglion cyst of dorsum of left wrist 04/20/2018   Prediabetes 04/17/2016   OBESITY, NOS 04/16/2006   Past Medical History:  Diagnosis Date   Bug bite 08/09/2018   Duodenal ulcer due to Helicobacter pylori 09/25/2010   EGD confirmed that duodenal ulcers and H. pylori gastritis in 08/2009. Treated with triple  therapy at [facility].    Gallbladder polyp 09/25/2010   GERD (gastroesophageal reflux disease)    Major depression in remission [facility])    OTHER DISEASES OF NASAL CAVITY AND SINUSES 02/23/2008   Qualifier: Diagnosis of  By: Daphine Deutscher MD, [facility]     RHINITIS, CHRONIC 04/16/2006   Qualifier: Diagnosis of  By: Bebe Shaggy     Screening for prostate cancer 08/12/2019   TENNIS ELBOW 04/16/2006   Qualifier: Diagnosis of  By: Bebe Shaggy     Tinea corporis 06/01/2017    Family History  Problem Relation Age of Onset   High blood pressure Father    Kidney disease Cousin    Diabetes Neg Hx    Early death Neg Hx    Heart disease Neg Hx  Hyperlipidemia Neg Hx    Hypertension Neg Hx     Past Surgical History:  Procedure Laterality Date   APPENDECTOMY  1981   CHOLECYSTECTOMY  10/15/10   Path results revealed chronic cholecystitis   ELBOW SURGERY  2005   right   SHOULDER ARTHROSCOPY WITH SUBACROMIAL DECOMPRESSION, ROTATOR CUFF REPAIR AND BICEP TENDON REPAIR Right 02/05/2021   Procedure: RIGHT SHOULDER ARTHROSCOPY, SUBACROMIAL DECOMPRESSION, DEBRIDEMENT, MINI OPEN ROTATOR CUFF TEAR REPAIR, BICEPS TENODESIS;  Surgeon: Cammy Copa, MD;  Location: [facility] OR;  Service: Orthopedics;  Laterality: Right;   UPPER GASTROINTESTINAL ENDOSCOPY     Social History   Occupational History   Occupation: Financial planner: [employer]    Comment: [employer]  Tobacco Use   Smoking status: Never   Smokeless tobacco: Never   Vaping Use   Vaping Use: Never used  Substance and Sexual Activity   Alcohol use: No   Drug use: No   Sexual activity: Yes

## 2021-02-25 ENCOUNTER — Telehealth: Payer: Self-pay

## 2021-02-25 NOTE — Progress Notes (Signed)
° ° °  SUBJECTIVE:   CHIEF COMPLAINT / HPI:   ED follow up- seen on 02/18/2021, had a fall in the shower and hit his head and then noted headache and complete loss of hearing on his left side with tinnitus. Had CT head with no acute intracranial abnormality. CXR showed cardiomegaly and small bilateral pleural effusions. ENT referral recommended in ED but no referral placed.  He notes the tinnitus is continuous and not pulsatile.  He notes that he thinks he might have felt dizzy in the shower, denies any preceding chest pain, palpitations, seizure-like activity, and as he was falling he did not want to hit his shoulder that he had just had surgery on so he hit the back of his head in the shower on the left side.  He notes his daughter found him and a friend helped him get up, then he was feeling okay and he showered and laid back down.  The next day he woke up and had some chills and shivering so his daughter called the ambulance.  He was prescribed meds for pneumonia and is still taking in the ED.  He denies any cough, sweats, fever, chills, sputum production, shortness of breath, leg swelling, chest pain, palpitations.  He denies any headaches, vision changes, numbness, weakness.  He is not sure why he fell but notes he was taking narcotics for pain control from his surgery and wondering if that could be contributing In the ED he had a EKG that shows sinus tachycardia rate 124 no ST segment changes or T wave inversions, and a QTC of 445 ms.  PERTINENT  PMH / PSH: SLAP tear R shoulder s/p R shoulder arthroscopy, obesity, prediabetes, erectile dysfunction  OBJECTIVE:   BP 134/82    Pulse 81    Ht 6' (1.829 m)    Wt 260 lb 12.8 oz (118.3 kg)    SpO2 98%    BMI 35.37 kg/m   General: A&O, NAD HEENT: No sign of trauma, EOM intact, bilateral TMs clear without rupture, effusion, or erythema, minimal canal wax, no blood or debris in canal Cardiac: RRR, no m/r/g, no JVD Respiratory: CTAB, normal WOB, no  w/c/r GI: Soft, NTTP, non-distended  Extremities: NTTP, no peripheral edema. Neuro: Normal gait, moves all four extremities appropriately.  Cranial nerves II through XII intact except for failed hearing on the left.  He also notes decreased sensation in the internal ear canal on the left.  Weber testing lateralizes to the right ear.  Rinne testing on the left side has bone-conduction present when air conduction is not. Psych: Appropriate mood and affect   ASSESSMENT/PLAN:   Unilateral conductive hearing loss - pt with unilateral left sided hearing loss and continuous non-pulsatile tinnitus after fall on 02/18/2021, completely failed hearing test, interestingly on exam Weber lateralizes to R normal ear which is suggestive of sensorineural hearing loss, but Rhinne testing has BC>AC on left ear consistent with conductive hearing loss - no TM abnormality visualized - urgent referral to ENT, gave patient number to call Dr Avel Sensor office as well  Pre-syncope - no preceding symptoms, pt was taking narcotics for recent shoulder surgery so possibly due to drowsiness from those - will schedule ECHO to assess cardiac function, as small bilateral pleural effusions noted in ED as well as cardiomegaly, but no signs of heart failure on exam today or symptoms per patient report     Billey Co, MD [facility]

## 2021-02-25 NOTE — Telephone Encounter (Signed)
Patient calls nurse line requesting to schedule follow up appointment from fall on 02/18/2021. Patient reports that he is having continued issues with tinnitus and difficulty hearing in left ear.   Per ED note, patient was to follow up with ENT outpatient. Patient is not already established with ENT and needs a referral.   Scheduled patient for tomorrow morning with Dr. Thompson Grayer for follow up.   Talbot Grumbling, RN

## 2021-02-26 ENCOUNTER — Encounter: Payer: Self-pay | Admitting: Family Medicine

## 2021-02-26 ENCOUNTER — Ambulatory Visit (INDEPENDENT_AMBULATORY_CARE_PROVIDER_SITE_OTHER): Payer: No Typology Code available for payment source | Admitting: Family Medicine

## 2021-02-26 ENCOUNTER — Other Ambulatory Visit: Payer: Self-pay

## 2021-02-26 VITALS — BP 134/82 | HR 81 | Ht 72.0 in | Wt 260.8 lb

## 2021-02-26 DIAGNOSIS — H902 Conductive hearing loss, unspecified: Secondary | ICD-10-CM | POA: Diagnosis not present

## 2021-02-26 DIAGNOSIS — R55 Syncope and collapse: Secondary | ICD-10-CM | POA: Diagnosis not present

## 2021-02-26 NOTE — Assessment & Plan Note (Signed)
-   no preceding symptoms, pt was taking narcotics for recent shoulder surgery so possibly due to drowsiness from those - will schedule ECHO to assess cardiac function, as small bilateral pleural effusions noted in ED as well as cardiomegaly, but no signs of heart failure on exam today or symptoms per patient report

## 2021-02-26 NOTE — Patient Instructions (Addendum)
It was wonderful to see you today.  Please bring ALL of your medications with you to every visit.   Today we talked about:  - We have referred you to the ENT Dr Suszanne Conners urgently - below is his clinic number to call to schedule appt [address] [place], [place] [address] [phone] - we have scheduled an ECHO ultrasound to look at your heart function this will be on January 25 at 9:05 AM at [facility]  Please make a 1 week follow up with your PCP   Thank you for choosing [facility].   Please call [phone] with any questions about today's appointment.  Please be sure to schedule follow up at the front  desk before you leave today.   Burley Saver, MD  Family Medicine

## 2021-02-26 NOTE — Assessment & Plan Note (Addendum)
-   pt with unilateral left sided hearing loss and continuous non-pulsatile tinnitus after fall on 02/18/2021, completely failed hearing test, interestingly on exam Weber lateralizes to R normal ear which is suggestive of sensorineural hearing loss, but Rhinne testing has BC>AC on left ear consistent with conductive hearing loss - no TM abnormality visualized - urgent referral to ENT, gave patient number to call Dr Avel Sensor office as well

## 2021-02-27 ENCOUNTER — Encounter: Payer: Self-pay | Admitting: Rehabilitative and Restorative Service Providers"

## 2021-02-27 ENCOUNTER — Ambulatory Visit (INDEPENDENT_AMBULATORY_CARE_PROVIDER_SITE_OTHER): Payer: No Typology Code available for payment source | Admitting: Rehabilitative and Restorative Service Providers"

## 2021-02-27 DIAGNOSIS — M25511 Pain in right shoulder: Secondary | ICD-10-CM

## 2021-02-27 DIAGNOSIS — M25611 Stiffness of right shoulder, not elsewhere classified: Secondary | ICD-10-CM | POA: Diagnosis not present

## 2021-02-27 DIAGNOSIS — R600 Localized edema: Secondary | ICD-10-CM

## 2021-02-27 DIAGNOSIS — M6281 Muscle weakness (generalized): Secondary | ICD-10-CM | POA: Diagnosis not present

## 2021-02-27 DIAGNOSIS — G8929 Other chronic pain: Secondary | ICD-10-CM

## 2021-02-27 NOTE — Patient Instructions (Signed)
Access Code: [email or web address] URL: [email or web address] Date: 02/27/2021 Prepared by: Chyrel Masson  Exercises Supine Shoulder Flexion PROM (Mirrored) - 2-3 x daily - 7 x weekly - 1-2 sets - 10 reps - 5 hold Supine Shoulder External Rotation with Dowel at 20 Degrees of Abduction - 2-3 x daily - 7 x weekly - 1-2 sets - 10 reps - 5 hold Seated Scapular Retraction - 2-3 x daily - 7 x weekly - 1-2 sets - 10 reps - 5 hold Circular Shoulder Pendulum with Table Support - 2-3 x daily - 7 x weekly - 1 sets - 20 reps

## 2021-02-27 NOTE — Therapy (Signed)
[facility] [address] [place], [place], [address] Phone: [phone]   Fax:  [phone]  Physical Therapy Evaluation  Patient Details  Name: Tony Nguyen MRN: [id number] Date of Birth: December 13, 1960 Referring Provider (PT): August Saucer Corrie Mckusick, MD   Encounter Date: 02/27/2021   PT End of Session - 02/27/21 1344     Visit Number 1    Number of Visits 20    Date for PT Re-Evaluation 05/08/21    Authorization Type [employer]    PT Start Time 1414    PT Stop Time 1446    PT Time Calculation (min) 32 min    Activity Tolerance Patient limited by pain    Behavior During Therapy [facility] for tasks assessed/performed             Past Medical History:  Diagnosis Date   Bug bite 08/09/2018   Duodenal ulcer due to Helicobacter pylori 09/25/2010   EGD confirmed that duodenal ulcers and H. pylori gastritis in 08/2009. Treated with triple  therapy at [facility].    Gallbladder polyp 09/25/2010   GERD (gastroesophageal reflux disease)    Major depression in remission (HCC)    OTHER DISEASES OF NASAL CAVITY AND SINUSES 02/23/2008   Qualifier: Diagnosis of  By: Daphine Deutscher MD, [facility]     RHINITIS, CHRONIC 04/16/2006   Qualifier: Diagnosis of  By: Bebe Shaggy     Screening for prostate cancer 08/12/2019   TENNIS ELBOW 04/16/2006   Qualifier: Diagnosis of  By: Bebe Shaggy     Tinea corporis 06/01/2017    Past Surgical History:  Procedure Laterality Date   APPENDECTOMY  1981   CHOLECYSTECTOMY  10/15/10   Path results revealed chronic cholecystitis   ELBOW SURGERY  2005   right   SHOULDER ARTHROSCOPY WITH SUBACROMIAL DECOMPRESSION, ROTATOR CUFF REPAIR AND BICEP TENDON REPAIR Right 02/05/2021   Procedure: RIGHT SHOULDER ARTHROSCOPY, SUBACROMIAL DECOMPRESSION, DEBRIDEMENT, MINI OPEN ROTATOR CUFF TEAR REPAIR, BICEPS TENODESIS;  Surgeon: Cammy Copa, MD;  Location: [facility] OR;  Service: Orthopedics;  Laterality: Right;   UPPER  GASTROINTESTINAL ENDOSCOPY      There were no vitals filed for this visit.    Subjective Assessment - 02/27/21 1419     Subjective Pt. indicated shoulder pain on July 15th 2022 with progressive difficulty lifting arm and sleeping due to pain.  Pt. indicated incident occured when pulling things on pallet and it started to fall on to him and lifting the weight caused pain to start.  Pt. indicated having surgery on 02/05/2021 on Rt shoulder.  Pt. stated lifting arm is still troublesome and still has pain at night and some pain coming and going during the day.  Pt. indicated wearing sling until yesterday.  Pt. indicated having CPM machine at home.    Limitations House hold activities;Lifting    Patient Stated Goals Reduce pain, return to work    Currently in Pain? Yes    Pain Score 6    pain at 8/10   Pain Location Shoulder    Pain Orientation Right;Anterior    Pain Descriptors / Indicators Sharp    Pain Type Chronic pain;Surgical pain    Pain Radiating Towards upper arm, did report numbness in 4th and 5th digit in hand    Pain Onset More than a month ago    Pain Frequency Constant    Aggravating Factors  lifting arm, nighttime, fairly constant    Pain Relieving Factors medicine    Effect of Pain on Daily  Activities Limited in Rt arm use.                [facility] - 02/27/21 0001       Assessment   Medical Diagnosis S46.011D (ICD-10-CM) - Traumatic complete tear of right rotator cuff, subsequent encounter    Referring Provider (PT) August Saucerean, Corrie MckusickGregory Scott, MD    Onset Date/Surgical Date 02/05/21    Hand Dominance Right      Precautions   Precaution Comments ROM and isometric strengthening 3x/wk for 6 wks (04/03/2021 from referral date)      Balance Screen   Has the patient fallen in the past 6 months Yes    How many times? 1    Has the patient had a decrease in activity level because of a fear of falling?  No    Is the patient reluctant to leave their home because of a  fear of falling?  No      Home [employer] information [employer] managernvironment   Living Environment Private residence      Prior Function   Vocation Full time employment    Vocation Requirements Has to position and lift/move items on pallets. LIfting around 35-40 lbs.  Not working at this time due to surgery.    Leisure walking      Cognition   Overall Cognitive Status Within Functional Limits for tasks assessed      Observation/Other Assessments   Focus on Therapeutic Outcomes (FOTO)  intake 36%, predicted 64%      Observation/Other Assessments-Edema    Edema --   Localized edema noted Rt shoulder     Posture/Postural Control   Posture/Postural Control Postural limitations    Postural Limitations Rounded Shoulders      ROM / Strength   AROM / PROM / Strength AROM;PROM;Strength      AROM   Overall AROM Comments Held additional AROM due to pain complaints and difficulty noted in supine flexion measurement below.  Lt shoulder AROM WFL at this time    AROM Assessment Site Shoulder;Elbow    Right/Left Shoulder Left;Right    Right Shoulder Extension --    Right Shoulder Flexion 30 Degrees      PROM   Overall PROM Comments Pain limited in all directions, no specific tightness noted in capsule at these measurements.  Guarding noted throughout.  Measured in supine    PROM Assessment Site Shoulder    Right/Left Shoulder Right;Left    Right Shoulder Flexion 90 Degrees    Right Shoulder ABduction 40 Degrees    Right Shoulder External Rotation 25 Degrees      Strength   Overall Strength Comments Rt strength testing withheld due to pain and surgical post op status at this time.    Strength Assessment Site Shoulder;Elbow    Right/Left Shoulder Right;Left    Left Shoulder Flexion 5/5    Left Shoulder ABduction 5/5    Left Shoulder Internal Rotation 5/5    Left Shoulder External Rotation 5/5    Right/Left Elbow Right;Left    Left Elbow Flexion 5/5    Left Elbow Extension 5/5      Palpation   Palpation comment  Tenderness around anterior, superior, lateral Rt GH joint line                        Objective measurements completed on examination: See above findings.       [facility] Adult PT Treatment/Exercise - 02/27/21 0001  Exercises   Exercises Other Exercises;Shoulder    Other Exercises  HEP instruction/performance c cues for techniques, handout provided.  Trial set performed of each for comprehension and symptom assessment.      Shoulder Exercises: Supine   Other Supine Exercises supine AAROM Lt arm lifting of Rt into flexion to tolerance x 10    Other Supine Exercises supine wand ER to tolerance c arm at side 5 sec hold x 5      Shoulder Exercises: Seated   Other Seated Exercises seated scapular retraction 5 sec hold x 10      Shoulder Exercises: Standing   Other Standing Exercises Pendulums circles cw, ccw x 10 each way      Manual Therapy   Manual therapy comments passive movement each direction Rt GH joint to tolerance, g2 mobs inferior in flexion, scaption                     PT Education - 02/27/21 1449     Education Details HEP, POC    Person(s) Educated Patient    Methods Explanation;Demonstration;Verbal cues;Handout    Comprehension Verbalized understanding;Returned demonstration              PT Short Term Goals - 02/27/21 1344       PT SHORT TERM GOAL #1   Title Patient will demonstrate independent use of home exercise program to maintain progress from in clinic treatments.    Time 3    Period Weeks    Status New    Target Date 03/20/21               PT Long Term Goals - 02/27/21 1344       PT LONG TERM GOAL #1   Title Patient will demonstrate/report pain at worst less than or equal to 2/10 to facilitate minimal limitation in daily activity secondary to pain symptoms.    Time 10    Period Weeks    Status New    Target Date 05/08/21      PT LONG TERM GOAL #2   Title Patient will demonstrate independent use of home  exercise program to facilitate ability to maintain/progress functional gains from skilled physical therapy services.    Time 10    Period Weeks    Status New    Target Date 05/08/21      PT LONG TERM GOAL #3   Title Pt. will demonstrate FOTO outcome > or = 64 % to indicated reduced disability due to condition.    Time 10    Period Weeks    Status New    Target Date 05/08/21      PT LONG TERM GOAL #4   Title Patient will demonstrate Rt GH joint mobility WFL to facilitate usual self care, dressing, reaching overhead at PLOF s limitation due to symptoms.    Time 10    Period Weeks    Status New    Target Date 05/08/21      PT LONG TERM GOAL #5   Title Patient will demonstrate Rt UE MMT 5/5, with 15 % dynamometry of Lt throughout to facilitate usual lifting, carrying in functional activity to PLOF s limitation.    Time 10    Period Weeks    Status New    Target Date 05/08/21      Additional Long Term Goals   Additional Long Term Goals Yes      PT LONG TERM GOAL #6  Title Pt. will return to work at [employer] s limitation    Time 10    Period Weeks    Status New    Target Date 05/08/21                    Plan - 02/27/21 1350     Clinical Impression Statement Patient is a 61 y.o. who comes to clinic with complaints of Rt shoulder pain s/p recent SAD, mini open rotator cuff repair with bicep tenodesis on 02/05/2021 with mobility, strength and movement coordination deficits that impair their ability to perform usual daily and recreational functional activities without increase difficulty/symptoms at this time.  Patient to benefit from skilled PT services to address impairments and limitations to improve to previous level of function without restriction secondary to condition.    Examination-Activity Limitations Reach Overhead;Lift;Hygiene/Grooming;Dressing;Carry;Sleep;Bed Mobility;Bathing    Examination-Participation Restrictions Occupation;Meal Prep;Community  Activity;Cleaning;Interpersonal Relationship;Yard Work    Stability/Clinical Decision Making Stable/Uncomplicated    Optometrist Low    Rehab Potential Good    PT Frequency Other (comment)   2-3x/week   PT Duration Other (comment)   10 weeks   PT Treatment/Interventions ADLs/Self Care Home Management;Cryotherapy;Electrical Stimulation;Iontophoresis 4mg /ml Dexamethasone;Moist Heat;Balance training;Therapeutic exercise;Therapeutic activities;Functional mobility training;Stair training;Gait training;DME Instruction;Ultrasound;Neuromuscular re-education;Patient/family education;Passive range of motion;Spinal Manipulations;Joint Manipulations;Dry needling;Taping;Vasopneumatic Device;Manual techniques    PT Next Visit Plan Progressive mobility gain in manual and ther ex, early AAROM/AROM to tolerance.  ROM/ISOMETRICS per referral    PT Home Exercise Plan [email or web address]    Consulted and Agree with Plan of Care Patient             Patient will benefit from skilled therapeutic intervention in order to improve the following deficits and impairments:  Decreased endurance, Hypomobility, Decreased activity tolerance, Decreased strength, Impaired UE functional use, Pain, Decreased mobility, Decreased range of motion, Impaired perceived functional ability, Improper body mechanics, Impaired flexibility, Decreased coordination, Increased edema  Visit Diagnosis: Chronic right shoulder pain  Muscle weakness (generalized)  Stiffness of right shoulder, not elsewhere classified  Localized edema     Problem List Patient Active Problem List   Diagnosis Date Noted   Unilateral conductive hearing loss 02/26/2021   Pre-syncope 02/26/2021   Complete tear of right rotator cuff    Biceps tendonitis on right    Degenerative superior labral anterior-to-posterior (SLAP) tear of right shoulder    Acute pain of right shoulder 01/11/2020   Blood pressure check 10/20/2019   Mood changes 04/12/2019    Acute midline low back pain without sciatica 02/13/2019   Erectile dysfunction 07/23/2018   Ganglion cyst of dorsum of left wrist 04/20/2018   Prediabetes 04/17/2016   OBESITY, NOS 04/16/2006   04/18/2006, PT, DPT, OCS, ATC 02/27/21  2:55 PM    [facility] [address] [place], [place], [place] Phone: [phone]   Fax:  [phone]  Name: Tony Nguyen MRN: Annye Rusk Date of Birth: Mar 08, 1960

## 2021-02-28 ENCOUNTER — Telehealth: Payer: Self-pay | Admitting: Orthopedic Surgery

## 2021-02-28 NOTE — Telephone Encounter (Signed)
Pt submitted medical release form, short term disability forms, and $25.00 cash payment to [employer]. Accepted 02/28/2021

## 2021-03-05 ENCOUNTER — Ambulatory Visit (INDEPENDENT_AMBULATORY_CARE_PROVIDER_SITE_OTHER): Payer: No Typology Code available for payment source | Admitting: Rehabilitative and Restorative Service Providers"

## 2021-03-05 ENCOUNTER — Encounter: Payer: Self-pay | Admitting: Rehabilitative and Restorative Service Providers"

## 2021-03-05 ENCOUNTER — Other Ambulatory Visit: Payer: Self-pay

## 2021-03-05 DIAGNOSIS — R600 Localized edema: Secondary | ICD-10-CM | POA: Diagnosis not present

## 2021-03-05 DIAGNOSIS — G8929 Other chronic pain: Secondary | ICD-10-CM

## 2021-03-05 DIAGNOSIS — M25511 Pain in right shoulder: Secondary | ICD-10-CM

## 2021-03-05 DIAGNOSIS — M25611 Stiffness of right shoulder, not elsewhere classified: Secondary | ICD-10-CM

## 2021-03-05 DIAGNOSIS — M6281 Muscle weakness (generalized): Secondary | ICD-10-CM

## 2021-03-05 NOTE — Therapy (Signed)
[facility] Therapy [address] [place], [place], [address] Phone: [phone]   Fax:  [phone]  Physical Therapy Treatment  Patient Details  Name: Tony Nguyen MRN: [id number] Date of Birth: February 25, 1960 Referring Provider (PT): Marlou Sa Tonna Corner, MD   Encounter Date: 03/05/2021   PT End of Session - 03/05/21 0846     Visit Number 2    Number of Visits 20    Date for PT Re-Evaluation 05/08/21    Authorization Type [employer]    PT Start Time 0845    PT Stop Time 0924    PT Time Calculation (min) 39 min    Activity Tolerance Patient limited by pain    Behavior During Therapy [facility] for tasks assessed/performed             Past Medical History:  Diagnosis Date   Bug bite 08/09/2018   Duodenal ulcer due to Helicobacter pylori [id number]   EGD confirmed that duodenal ulcers and H. pylori gastritis in 08/2009. Treated with triple  therapy at [place].    Gallbladder polyp 09/25/2010   GERD (gastroesophageal reflux disease)    Major depression in remission ([place])    OTHER DISEASES OF NASAL CAVITY AND SINUSES 02/23/2008   Qualifier: Diagnosis of  By: Hassell Done MD, [place], CHRONIC 04/16/2006   Qualifier: Diagnosis of  By: Herma Ard     Screening for prostate cancer 08/12/2019   TENNIS ELBOW 04/16/2006   Qualifier: Diagnosis of  By: Herma Ard     Tinea corporis 06/01/2017    Past Surgical History:  Procedure Laterality Date   APPENDECTOMY  1981   CHOLECYSTECTOMY  10/15/10   Path results revealed chronic cholecystitis   ELBOW SURGERY  2005   right   SHOULDER ARTHROSCOPY WITH SUBACROMIAL DECOMPRESSION, ROTATOR CUFF REPAIR AND BICEP TENDON REPAIR Right 02/05/2021   Procedure: RIGHT SHOULDER ARTHROSCOPY, SUBACROMIAL DECOMPRESSION, DEBRIDEMENT, MINI OPEN ROTATOR CUFF TEAR REPAIR, BICEPS TENODESIS;  Surgeon: Meredith Pel, MD;  Location: [facility];  Service: Orthopedics;  Laterality: Right;   UPPER  GASTROINTESTINAL ENDOSCOPY      There were no vitals filed for this visit.   Subjective Assessment - 03/05/21 0850     Subjective Pt. indicated doing ok with exercises until he felt like he over did it some Sunday and had increased pain/soreness in Rt shoulder/upper arm in last day or so.  Rated 7/10 at worst.    Limitations House hold activities;Lifting    Patient Stated Goals Reduce pain, return to work    Currently in Pain? Yes    Pain Score 7    at worst   Pain Location Shoulder    Pain Orientation Right    Pain Descriptors / Indicators Aching;Sore    Pain Type Chronic pain;Surgical pain    Pain Onset More than a month ago    Pain Frequency Constant    Aggravating Factors  increase after "overdoing it Sunday with exercises and arm use in general"    Pain Relieving Factors nothing specific                [facility] PT Assessment - 03/05/21 0001       Assessment   Medical Diagnosis S46.011D (ICD-10-CM) - Traumatic complete tear of right rotator cuff, subsequent encounter    Referring Provider (PT) Meredith Pel, MD    Onset Date/Surgical Date 02/05/21    Hand Dominance Right      PROM   Right Shoulder Flexion  110 Degrees   in supine   Right Shoulder External Rotation 35 Degrees   in supine 45 deg abduction                          [facility] Adult PT Treatment/Exercise - 03/05/21 0001       Shoulder Exercises: Supine   Protraction Both;Other (comment)   5 sec hold x 10 c 1 lb bar   Other Supine Exercises supine AAROM c 1 lb wand 2 x 10    Other Supine Exercises supine wand ER to tolerance 10 sec hold x 10      Shoulder Exercises: Seated   Other Seated Exercises scapular retraction hold 5 sec x 10      Shoulder Exercises: Pulleys   Flexion 3 minutes    Scaption 3 minutes      Manual Therapy   Manual therapy comments Rt shoulder PROM all directions, g2-g3 inferior joint mobs, mobilization c movement posterior glide c passive ER in 45 deg  abduction                       PT Short Term Goals - 03/05/21 [id number]       PT SHORT TERM GOAL #1   Title Patient will demonstrate independent use of home exercise program to maintain progress from in clinic treatments.    Time 3    Period Weeks    Status On-going    Target Date 03/20/21               PT Long Term Goals - 02/27/21 1344       PT LONG TERM GOAL #1   Title Patient will demonstrate/report pain at worst less than or equal to 2/10 to facilitate minimal limitation in daily activity secondary to pain symptoms.    Time 10    Period Weeks    Status New    Target Date 05/08/21      PT LONG TERM GOAL #2   Title Patient will demonstrate independent use of home exercise program to facilitate ability to maintain/progress functional gains from skilled physical therapy services.    Time 10    Period Weeks    Status New    Target Date 05/08/21      PT LONG TERM GOAL #3   Title Pt. will demonstrate FOTO outcome > or = 64 % to indicated reduced disability due to condition.    Time 10    Period Weeks    Status New    Target Date 05/08/21      PT LONG TERM GOAL #4   Title Patient will demonstrate Rt [place] joint mobility WFL to facilitate usual self care, dressing, reaching overhead at PLOF s limitation due to symptoms.    Time 10    Period Weeks    Status New    Target Date 05/08/21      PT LONG TERM GOAL #5   Title Patient will demonstrate Rt UE MMT 5/5, with 15 % dynamometry of Lt throughout to facilitate usual lifting, carrying in functional activity to PLOF s limitation.    Time 10    Period Weeks    Status New    Target Date 05/08/21      Additional Long Term Goals   Additional Long Term Goals Yes      PT LONG TERM GOAL #6   Title Pt. will return to work at [employer] s  limitation    Time 10    Period Weeks    Status New    Target Date 05/08/21                   Plan - 03/05/21 0912     Clinical Impression Statement Pt. continued to  report pain complaint increase at end range of movements as noted in clinic.  Cues given for HEP to avoid exacerbation of symptoms in movements but promote gentle stretching.  Passive mobility was documented better today in several areas compared to evaluation.  Pt. to continue to benefit from skilled PT services.    Examination-Activity Limitations Reach Overhead;Lift;Hygiene/Grooming;Dressing;Carry;Sleep;Bed Mobility;Bathing    Examination-Participation Restrictions Occupation;Meal Prep;Community Activity;Cleaning;Interpersonal Relationship;Yard Work    Stability/Clinical Decision Making Stable/Uncomplicated    Rehab Potential Good    PT Frequency Other (comment)   2-3x/week   PT Duration Other (comment)   10 weeks   PT Treatment/Interventions ADLs/Self Care Home Management;Cryotherapy;Electrical Stimulation;Iontophoresis 4mg /ml Dexamethasone;Moist Heat;Balance training;Therapeutic exercise;Therapeutic activities;Functional mobility training;Stair training;Gait training;DME Instruction;Ultrasound;Neuromuscular re-education;Patient/family education;Passive range of motion;Spinal Manipulations;Joint Manipulations;Dry needling;Taping;Vasopneumatic Device;Manual techniques    PT Next Visit Plan Continue manual intervention, ther ex to promote improved PROM/AAROM gains.  ROM/ISOMETRICS per referral    PT Home Exercise Plan [email or web address]    Consulted and Agree with Plan of Care Patient             Patient will benefit from skilled therapeutic intervention in order to improve the following deficits and impairments:  Decreased endurance, Hypomobility, Decreased activity tolerance, Decreased strength, Impaired UE functional use, Pain, Decreased mobility, Decreased range of motion, Impaired perceived functional ability, Improper body mechanics, Impaired flexibility, Decreased coordination, Increased edema  Visit Diagnosis: Chronic right shoulder pain  Muscle weakness (generalized)  Stiffness of right  shoulder, not elsewhere classified  Localized edema     Problem List Patient Active Problem List   Diagnosis Date Noted   Unilateral conductive hearing loss 02/26/2021   Pre-syncope 02/26/2021   Complete tear of right rotator cuff    Biceps tendonitis on right    Degenerative superior labral anterior-to-posterior (SLAP) tear of right shoulder    Acute pain of right shoulder 01/11/2020   Blood pressure check 10/20/2019   Mood changes 04/12/2019   Acute midline low back pain without sciatica 02/13/2019   Erectile dysfunction 07/23/2018   Ganglion cyst of dorsum of left wrist 04/20/2018   Prediabetes 04/17/2016   OBESITY, NOS 04/16/2006    Scot Jun, PT, DPT, OCS, ATC 03/05/21  9:21 AM    [facility] Physical Therapy [address] [place], [place], [address] Phone: [phone]   Fax:  [phone]  Name: SYLAR FOXX MRN: [id number] Date of Birth: 1960/04/29

## 2021-03-07 ENCOUNTER — Other Ambulatory Visit: Payer: Self-pay

## 2021-03-07 ENCOUNTER — Ambulatory Visit (INDEPENDENT_AMBULATORY_CARE_PROVIDER_SITE_OTHER): Payer: No Typology Code available for payment source | Admitting: Surgical

## 2021-03-07 DIAGNOSIS — Z9889 Other specified postprocedural states: Secondary | ICD-10-CM

## 2021-03-08 ENCOUNTER — Encounter: Payer: Self-pay | Admitting: Rehabilitative and Restorative Service Providers"

## 2021-03-08 ENCOUNTER — Ambulatory Visit (INDEPENDENT_AMBULATORY_CARE_PROVIDER_SITE_OTHER): Payer: No Typology Code available for payment source | Admitting: Rehabilitative and Restorative Service Providers"

## 2021-03-08 DIAGNOSIS — R600 Localized edema: Secondary | ICD-10-CM

## 2021-03-08 DIAGNOSIS — M25511 Pain in right shoulder: Secondary | ICD-10-CM

## 2021-03-08 DIAGNOSIS — M25611 Stiffness of right shoulder, not elsewhere classified: Secondary | ICD-10-CM | POA: Diagnosis not present

## 2021-03-08 DIAGNOSIS — G8929 Other chronic pain: Secondary | ICD-10-CM

## 2021-03-08 DIAGNOSIS — M6281 Muscle weakness (generalized): Secondary | ICD-10-CM

## 2021-03-08 NOTE — Therapy (Signed)
[facility] Physical Therapy [address] [place], [place], [address] Phone: [phone]   Fax:  [phone]  Physical Therapy Treatment  Patient Details  Name: Tony Nguyen MRN: [id number] Date of Birth: [birth year] Referring Provider (PT): Marlou Sa Tonna Corner, MD   Encounter Date: 03/08/2021   PT End of Session - 03/08/21 1051     Visit Number 3    Number of Visits 20    Date for PT Re-Evaluation 05/08/21    Authorization Type [employer]    PT Start Time 1059    PT Stop Time 1138    PT Time Calculation (min) 39 min    Activity Tolerance Patient tolerated treatment well    Behavior During Therapy [facility] for tasks assessed/performed             Past Medical History:  Diagnosis Date   Bug bite 08/09/2018   Duodenal ulcer due to Helicobacter pylori A999333   EGD confirmed that duodenal ulcers and H. pylori gastritis in 08/2009. Treated with triple  therapy at [place].    Gallbladder polyp 09/25/2010   GERD (gastroesophageal reflux disease)    Major depression in remission ([place])    OTHER DISEASES OF NASAL CAVITY AND SINUSES 02/23/2008   Qualifier: Diagnosis of  By: Hassell Done MD, [place], CHRONIC 04/16/2006   Qualifier: Diagnosis of  By: Herma Ard     Screening for prostate cancer 08/12/2019   TENNIS ELBOW 04/16/2006   Qualifier: Diagnosis of  By: Herma Ard     Tinea corporis 06/01/2017    Past Surgical History:  Procedure Laterality Date   APPENDECTOMY  1981   CHOLECYSTECTOMY  10/15/10   Path results revealed chronic cholecystitis   ELBOW SURGERY  2005   right   SHOULDER ARTHROSCOPY WITH SUBACROMIAL DECOMPRESSION, ROTATOR CUFF REPAIR AND BICEP TENDON REPAIR Right 02/05/2021   Procedure: RIGHT SHOULDER ARTHROSCOPY, SUBACROMIAL DECOMPRESSION, DEBRIDEMENT, MINI OPEN ROTATOR CUFF TEAR REPAIR, BICEPS TENODESIS;  Surgeon: Meredith Pel, MD;  Location: [facility];  Service: Orthopedics;  Laterality: Right;   UPPER  GASTROINTESTINAL ENDOSCOPY      There were no vitals filed for this visit.   Subjective Assessment - 03/08/21 1052     Subjective Pt. indicated pain off and on, reported at 5/10 today.    Limitations House hold activities;Lifting    Patient Stated Goals Reduce pain, return to work    Currently in Pain? Yes    Pain Score 5     Pain Location Shoulder    Pain Orientation Right    Pain Descriptors / Indicators Aching;Sore    Pain Onset More than a month ago    Aggravating Factors  lifting arm    Pain Relieving Factors not lifting                [facility] PT Assessment - 03/08/21 0001       Assessment   Medical Diagnosis S46.011D (ICD-10-CM) - Traumatic complete tear of right rotator cuff, subsequent encounter    Referring Provider (PT) Marlou Sa Tonna Corner, MD    Onset Date/Surgical Date 02/05/21    Hand Dominance Right      Precautions   Precaution Comments ROM and isometric strengthening 3x/wk for 6 wks (04/03/2021 from referral date)                           [facility] Adult PT Treatment/Exercise - 03/08/21 0001       Shoulder  Exercises: Supine   Other Supine Exercises supine AAROM c 1 lb wand x15, passive c Lt arm moving Rt x 10      Shoulder Exercises: Pulleys   Flexion 3 minutes    Scaption 3 minutes      Shoulder Exercises: ROM/Strengthening   UBE (Upper Arm Bike) Lvl 1 AAROM 3 mins fwd/back each way      Shoulder Exercises: Isometric Strengthening   Other Isometric Exercises 5 sec on/off holds x 12 for Rt shoulder flexion, abduction, ER, IR in neutral positioning submax/painfree                     PT Education - 03/08/21 1113     Education Details HEP progression( AAROM, isometrics)    Person(s) Educated Patient    Methods Explanation;Demonstration;Verbal cues;Handout    Comprehension Verbalized understanding;Returned demonstration              PT Short Term Goals - 03/05/21 [id number]       PT SHORT TERM GOAL #1   Title  Patient will demonstrate independent use of home exercise program to maintain progress from in clinic treatments.    Time 3    Period Weeks    Status On-going    Target Date 03/20/21               PT Long Term Goals - 02/27/21 1344       PT LONG TERM GOAL #1   Title Patient will demonstrate/report pain at worst less than or equal to 2/10 to facilitate minimal limitation in daily activity secondary to pain symptoms.    Time 10    Period Weeks    Status New    Target Date 05/08/21      PT LONG TERM GOAL #2   Title Patient will demonstrate independent use of home exercise program to facilitate ability to maintain/progress functional gains from skilled physical therapy services.    Time 10    Period Weeks    Status New    Target Date 05/08/21      PT LONG TERM GOAL #3   Title Pt. will demonstrate FOTO outcome > or = 64 % to indicated reduced disability due to condition.    Time 10    Period Weeks    Status New    Target Date 05/08/21      PT LONG TERM GOAL #4   Title Patient will demonstrate Rt [place] joint mobility WFL to facilitate usual self care, dressing, reaching overhead at PLOF s limitation due to symptoms.    Time 10    Period Weeks    Status New    Target Date 05/08/21      PT LONG TERM GOAL #5   Title Patient will demonstrate Rt UE MMT 5/5, with 15 % dynamometry of Lt throughout to facilitate usual lifting, carrying in functional activity to PLOF s limitation.    Time 10    Period Weeks    Status New    Target Date 05/08/21      Additional Long Term Goals   Additional Long Term Goals Yes      PT LONG TERM GOAL #6   Title Pt. will return to work at [employer] s limitation    Time 10    Period Weeks    Status New    Target Date 05/08/21                   Plan -  03/08/21 1109     Clinical Impression Statement Pt. demonstrated improved tolerance and quality of passive/AAROM movement today.  Pt. did have some difficulty on wand flexion due to  increased demand on Rt arm activation for movement.  Cues giving for home adaptations to reduce Rt arm use as needed. Progressed to include isometric holds within sub max pain free range c cues for home use.  Continued skilled PT necessary to gain mobility/strength for functional use.    Examination-Activity Limitations Reach Overhead;Lift;Hygiene/Grooming;Dressing;Carry;Sleep;Bed Mobility;Bathing    Examination-Participation Restrictions Occupation;Meal Prep;Community Activity;Cleaning;Interpersonal Relationship;Yard Work    Stability/Clinical Decision Making Stable/Uncomplicated    Rehab Potential Good    PT Frequency Other (comment)   2-3x/week   PT Duration Other (comment)   10 weeks   PT Treatment/Interventions ADLs/Self Care Home Management;Cryotherapy;Electrical Stimulation;Iontophoresis 4mg /ml Dexamethasone;Moist Heat;Balance training;Therapeutic exercise;Therapeutic activities;Functional mobility training;Stair training;Gait training;DME Instruction;Ultrasound;Neuromuscular re-education;Patient/family education;Passive range of motion;Spinal Manipulations;Joint Manipulations;Dry needling;Taping;Vasopneumatic Device;Manual techniques    PT Next Visit Plan AAROM transitioning, isometric review.  ROM/ISOMETRICS per referral    PT Home Exercise Plan [email or web address]    Consulted and Agree with Plan of Care Patient             Patient will benefit from skilled therapeutic intervention in order to improve the following deficits and impairments:  Decreased endurance, Hypomobility, Decreased activity tolerance, Decreased strength, Impaired UE functional use, Pain, Decreased mobility, Decreased range of motion, Impaired perceived functional ability, Improper body mechanics, Impaired flexibility, Decreased coordination, Increased edema  Visit Diagnosis: Chronic right shoulder pain  Muscle weakness (generalized)  Stiffness of right shoulder, not elsewhere classified  Localized edema     Problem  List Patient Active Problem List   Diagnosis Date Noted   Unilateral conductive hearing loss 02/26/2021   Pre-syncope 02/26/2021   Complete tear of right rotator cuff    Biceps tendonitis on right    Degenerative superior labral anterior-to-posterior (SLAP) tear of right shoulder    Acute pain of right shoulder 01/11/2020   Blood pressure check 10/20/2019   Mood changes 04/12/2019   Acute midline low back pain without sciatica 02/13/2019   Erectile dysfunction 07/23/2018   Ganglion cyst of dorsum of left wrist 04/20/2018   Prediabetes 04/17/2016   OBESITY, NOS 04/16/2006   Scot Jun, PT, DPT, OCS, ATC 03/08/21  11:35 AM    [facility] [address] [place], [place], [address] Phone: [phone]   Fax:  [phone]  Name: Tony Nguyen MRN: [id number] Date of Birth: 03/05/1960

## 2021-03-08 NOTE — Patient Instructions (Signed)
Access Code: [email or web address] URL: [email or web address] Date: 03/08/2021 Prepared by: Chyrel Masson  Exercises Supine Shoulder External Rotation with Dowel at 20 Degrees of Abduction - 2-3 x daily - 7 x weekly - 1-2 sets - 10 reps - 5 hold Seated Scapular Retraction - 2-3 x daily - 7 x weekly - 1-2 sets - 10 reps - 5 hold Circular Shoulder Pendulum with Table Support - 2-3 x daily - 7 x weekly - 1 sets - 20 reps Supine Shoulder Flexion Extension AAROM with Dowel - 2 x daily - 7 x weekly - 3 sets - 10 reps - 5 hold Standing Isometric Shoulder Internal Rotation at Doorway - 2 x daily - 7 x weekly - 1 sets - 10 reps - 5 hold Isometric Shoulder External Rotation at Wall (Mirrored) - 2 x daily - 7 x weekly - 1 sets - 10 reps - 5 hold Isometric Shoulder Abduction at Wall (Mirrored) - 1 x daily - 7 x weekly - 1 sets - 10 reps - 5 hold Isometric Shoulder Flexion at Wall (Mirrored) - 2 x daily - 7 x weekly - 1 sets - 15 reps - 5 hold

## 2021-03-10 ENCOUNTER — Encounter: Payer: Self-pay | Admitting: Orthopedic Surgery

## 2021-03-10 NOTE — Progress Notes (Signed)
Post-Op Visit Note   Patient: Tony Nguyen           Date of Birth: 1960-09-25           MRN: [id number] Visit Date: 03/07/2021 PCP: Katha Cabal, DO   Assessment & Plan:  Chief Complaint:  Chief Complaint  Patient presents with   Right Shoulder - Routine Post Op    02/05/21--Right shoulder arthroscopy with limited debridement of the superior labrum, biceps tendon release, biceps tenodesis and mini open rotator cuff tear repair.   Visit Diagnoses: No diagnosis found.  Plan: Patient is a 61 year old male who presents s/p right shoulder arthroscopy with mini open rotator cuff tear repair and biceps tenodesis on 02/05/2021.  He reports he is progressing well.  He still has some difficulty sleeping at night.  Taking ibuprofen for pain control and only has to take oxycodone 2-3 times per week.  He is in physical therapy now upstairs.  He has been twice mostly focusing on pendulum exercises, passive range of motion, active assisted range of motion.  No strengthening of the rotator cuff yet.  Was having signs and symptoms of pneumonia or COVID/flu at last visit but all the symptoms have resolved.  Currently feels well and does not appear sick.  On exam he has incisions that are healing well.  No evidence of infection.  30 degrees external rotation, 60 degrees abduction, 95 degrees forward flexion.  Axillary nerve is intact with deltoid firing.  Decent early strength of supraspinatus, infraspinatus, subscapularis though it is painful understandably.  There is no coarse grinding or crepitus noted with passive motion of the shoulder.  2+ radial pulse of the operative extremity.  Plan is continue with physical therapy.  Okay to start rotator cuff strengthening exercises and full active range of motion at about 6 weeks out from procedure.  Follow-up in 4 weeks for clinical recheck with Dr. August Saucer.  Follow-Up Instructions: No follow-ups on file.   Orders:  No orders of the defined types  were placed in this encounter.  No orders of the defined types were placed in this encounter.   Imaging: No results found.  PMFS History: Patient Active Problem List   Diagnosis Date Noted   Unilateral conductive hearing loss 02/26/2021   Pre-syncope 02/26/2021   Complete tear of right rotator cuff    Biceps tendonitis on right    Degenerative superior labral anterior-to-posterior (SLAP) tear of right shoulder    Acute pain of right shoulder 01/11/2020   Blood pressure check 10/20/2019   Mood changes 04/12/2019   Acute midline low back pain without sciatica 02/13/2019   Erectile dysfunction 07/23/2018   Ganglion cyst of dorsum of left wrist 04/20/2018   Prediabetes 04/17/2016   OBESITY, NOS 04/16/2006   Past Medical History:  Diagnosis Date   Bug bite 08/09/2018   Duodenal ulcer due to Helicobacter pylori 09/25/2010   EGD confirmed that duodenal ulcers and H. pylori gastritis in 08/2009. Treated with triple  therapy at [facility].    Gallbladder polyp 09/25/2010   GERD (gastroesophageal reflux disease)    Major depression in remission [facility])    OTHER DISEASES OF NASAL CAVITY AND SINUSES 02/23/2008   Qualifier: Diagnosis of  By: Daphine Deutscher MD, [facility]     RHINITIS, CHRONIC 04/16/2006   Qualifier: Diagnosis of  By: Bebe Shaggy     Screening for prostate cancer 08/12/2019   TENNIS ELBOW 04/16/2006   Qualifier: Diagnosis of  By: Bebe Shaggy  Tinea corporis 06/01/2017    Family History  Problem Relation Age of Onset   High blood pressure Father    Kidney disease Cousin    Diabetes Neg Hx    Early death Neg Hx    Heart disease Neg Hx    Hyperlipidemia Neg Hx    Hypertension Neg Hx     Past Surgical History:  Procedure Laterality Date   APPENDECTOMY  1981   CHOLECYSTECTOMY  10/15/10   Path results revealed chronic cholecystitis   ELBOW SURGERY  2005   right   SHOULDER ARTHROSCOPY WITH SUBACROMIAL DECOMPRESSION, ROTATOR CUFF REPAIR AND BICEP TENDON REPAIR Right  02/05/2021   Procedure: RIGHT SHOULDER ARTHROSCOPY, SUBACROMIAL DECOMPRESSION, DEBRIDEMENT, MINI OPEN ROTATOR CUFF TEAR REPAIR, BICEPS TENODESIS;  Surgeon: Cammy Copa, MD;  Location: [facility];  Service: Orthopedics;  Laterality: Right;   UPPER GASTROINTESTINAL ENDOSCOPY     Social History   Occupational History   Occupation: Financial planner: [employer]    Comment: [employer]  Tobacco Use   Smoking status: Never   Smokeless tobacco: Never  Vaping Use   Vaping Use: Never used  Substance and Sexual Activity   Alcohol use: No   Drug use: No   Sexual activity: Yes

## 2021-03-13 ENCOUNTER — Ambulatory Visit (INDEPENDENT_AMBULATORY_CARE_PROVIDER_SITE_OTHER): Payer: No Typology Code available for payment source | Admitting: Rehabilitative and Restorative Service Providers"

## 2021-03-13 ENCOUNTER — Other Ambulatory Visit: Payer: Self-pay

## 2021-03-13 ENCOUNTER — Ambulatory Visit (HOSPITAL_COMMUNITY)
Admission: RE | Admit: 2021-03-13 | Discharge: 2021-03-13 | Disposition: A | Discharge status: Home / Self Care | Payer: No Typology Code available for payment source | Source: Ambulatory Visit | Attending: Family Medicine | Admitting: Family Medicine

## 2021-03-13 ENCOUNTER — Encounter: Payer: Self-pay | Admitting: Rehabilitative and Restorative Service Providers"

## 2021-03-13 DIAGNOSIS — I081 Rheumatic disorders of both mitral and tricuspid valves: Secondary | ICD-10-CM | POA: Diagnosis not present

## 2021-03-13 DIAGNOSIS — R600 Localized edema: Secondary | ICD-10-CM | POA: Diagnosis not present

## 2021-03-13 DIAGNOSIS — M25611 Stiffness of right shoulder, not elsewhere classified: Secondary | ICD-10-CM | POA: Diagnosis not present

## 2021-03-13 DIAGNOSIS — G8929 Other chronic pain: Secondary | ICD-10-CM

## 2021-03-13 DIAGNOSIS — M6281 Muscle weakness (generalized): Secondary | ICD-10-CM

## 2021-03-13 DIAGNOSIS — R55 Syncope and collapse: Secondary | ICD-10-CM | POA: Diagnosis present

## 2021-03-13 DIAGNOSIS — M25511 Pain in right shoulder: Secondary | ICD-10-CM | POA: Diagnosis not present

## 2021-03-13 LAB — ECHOCARDIOGRAM COMPLETE
Area-P 1/2: 3.5 cm2
S' Lateral: 3.7 cm

## 2021-03-13 NOTE — Therapy (Signed)
[facility] Physical Therapy [address] [place], [place], [address] Phone: [phone]   Fax:  [phone]  Physical Therapy Treatment  Patient Details  Name: Tony Nguyen MRN: [id number] Date of Birth: Aug 23, 1960 Referring Provider (PT): August Saucer Corrie Mckusick, MD   Encounter Date: 03/13/2021   PT End of Session - 03/13/21 1542     Visit Number 4    Number of Visits 20    Date for PT Re-Evaluation 05/08/21    Authorization Type [employer]    PT Start Time 1513    PT Stop Time 1552    PT Time Calculation (min) 39 min    Activity Tolerance Patient tolerated treatment well    Behavior During Therapy [facility] for tasks assessed/performed             Past Medical History:  Diagnosis Date   Bug bite 08/09/2018   Duodenal ulcer due to Helicobacter pylori 09/25/2010   EGD confirmed that duodenal ulcers and H. pylori gastritis in 08/2009. Treated with triple  therapy at [facility].    Gallbladder polyp 09/25/2010   GERD (gastroesophageal reflux disease)    Major depression in remission (HCC)    OTHER DISEASES OF NASAL CAVITY AND SINUSES 02/23/2008   Qualifier: Diagnosis of  By: Daphine Deutscher MD, [facility]     RHINITIS, CHRONIC 04/16/2006   Qualifier: Diagnosis of  By: Bebe Shaggy     Screening for prostate cancer 08/12/2019   TENNIS ELBOW 04/16/2006   Qualifier: Diagnosis of  By: Bebe Shaggy     Tinea corporis 06/01/2017    Past Surgical History:  Procedure Laterality Date   APPENDECTOMY  1981   CHOLECYSTECTOMY  10/15/10   Path results revealed chronic cholecystitis   ELBOW SURGERY  2005   right   SHOULDER ARTHROSCOPY WITH SUBACROMIAL DECOMPRESSION, ROTATOR CUFF REPAIR AND BICEP TENDON REPAIR Right 02/05/2021   Procedure: RIGHT SHOULDER ARTHROSCOPY, SUBACROMIAL DECOMPRESSION, DEBRIDEMENT, MINI OPEN ROTATOR CUFF TEAR REPAIR, BICEPS TENODESIS;  Surgeon: Cammy Copa, MD;  Location: [facility] OR;  Service: Orthopedics;  Laterality: Right;   UPPER  GASTROINTESTINAL ENDOSCOPY      There were no vitals filed for this visit.   Subjective Assessment - 03/13/21 1512     Subjective Pt. reported feeling some pop in shoulder with rolling in bed.  Pt. indicated no pain upon arrival today.    Limitations House hold activities;Lifting    Patient Stated Goals Reduce pain, return to work    Currently in Pain? No/denies    Pain Score 0-No pain    Pain Onset More than a month ago                               [facility] Adult PT Treatment/Exercise - 03/13/21 0001       Neuro Re-ed    Neuro Re-ed Details  stabilization holds in 100 deg flexion supine mild reisstances 15-20 second bouts      Shoulder Exercises: Supine   Other Supine Exercises supine AAROM wand flexion x 15      Shoulder Exercises: Sidelying   External Rotation AROM;Right   2 x 10 c towel at side     Shoulder Exercises: Standing   Other Standing Exercises green band rows, gh ext (non rotator cuff strengthening) 20x each    Other Standing Exercises wall slides flexion x 10 c 2-3 second hold in stretch      Shoulder Exercises: Pulleys   Flexion  2 minutes    Scaption 2 minutes      Shoulder Exercises: ROM/Strengthening   UBE (Upper Arm Bike) Lvl 3.0 3 mins fwd/back      Shoulder Exercises: Stretch   Other Shoulder Stretches 30 sec x 5 in doorway approx. 30 deg abduction      Manual Therapy   Manual therapy comments Rt shoulder inferior jt mobs G3, prom elevation flexion/scaption.  Mobilization c movement to Rt shoulder posterior glide c ER mobility passively                       PT Short Term Goals - 03/13/21 1514       PT SHORT TERM GOAL #1   Title Patient will demonstrate independent use of home exercise program to maintain progress from in clinic treatments.    Time 3    Period Weeks    Status Achieved    Target Date 03/20/21               PT Long Term Goals - 02/27/21 1344       PT LONG TERM GOAL #1   Title Patient  will demonstrate/report pain at worst less than or equal to 2/10 to facilitate minimal limitation in daily activity secondary to pain symptoms.    Time 10    Period Weeks    Status New    Target Date 05/08/21      PT LONG TERM GOAL #2   Title Patient will demonstrate independent use of home exercise program to facilitate ability to maintain/progress functional gains from skilled physical therapy services.    Time 10    Period Weeks    Status New    Target Date 05/08/21      PT LONG TERM GOAL #3   Title Pt. will demonstrate FOTO outcome > or = 64 % to indicated reduced disability due to condition.    Time 10    Period Weeks    Status New    Target Date 05/08/21      PT LONG TERM GOAL #4   Title Patient will demonstrate Rt GH joint mobility WFL to facilitate usual self care, dressing, reaching overhead at PLOF s limitation due to symptoms.    Time 10    Period Weeks    Status New    Target Date 05/08/21      PT LONG TERM GOAL #5   Title Patient will demonstrate Rt UE MMT 5/5, with 15 % dynamometry of Lt throughout to facilitate usual lifting, carrying in functional activity to PLOF s limitation.    Time 10    Period Weeks    Status New    Target Date 05/08/21      Additional Long Term Goals   Additional Long Term Goals Yes      PT LONG TERM GOAL #6   Title Pt. will return to work at [employer] s limitation    Time 10    Period Weeks    Status New    Target Date 05/08/21                   Plan - 03/13/21 1531     Clinical Impression Statement Improved quality of AAROM movement into flexion today c reduced complaints noted in movement against gravity.  Pt. to continue to benefit from gravity reduced progression of AAROM to AROM to continue to improve Rt shoulder mobility.    Examination-Activity Limitations Reach Overhead;Lift;Hygiene/Grooming;Dressing;Carry;Sleep;Bed Mobility;Bathing  Examination-Participation Restrictions Occupation;Meal Prep;Community  Activity;Cleaning;Interpersonal Relationship;Yard Work    Stability/Clinical Decision Making Stable/Uncomplicated    Rehab Potential Good    PT Frequency Other (comment)   2-3x/week   PT Duration Other (comment)   10 weeks   PT Treatment/Interventions ADLs/Self Care Home Management;Cryotherapy;Electrical Stimulation;Iontophoresis 4mg /ml Dexamethasone;Moist Heat;Balance training;Therapeutic exercise;Therapeutic activities;Functional mobility training;Stair training;Gait training;DME Instruction;Ultrasound;Neuromuscular re-education;Patient/family education;Passive range of motion;Spinal Manipulations;Joint Manipulations;Dry needling;Taping;Vasopneumatic Device;Manual techniques    PT Next Visit Plan Continue gravity reduced ROM transitioning towards AROM as tolerated.       ROM/ISOMETRICS per referral (through 04/03/2021)    PT Home Exercise Plan [email or web address]    Consulted and Agree with Plan of Care Patient             Patient will benefit from skilled therapeutic intervention in order to improve the following deficits and impairments:  Decreased endurance, Hypomobility, Decreased activity tolerance, Decreased strength, Impaired UE functional use, Pain, Decreased mobility, Decreased range of motion, Impaired perceived functional ability, Improper body mechanics, Impaired flexibility, Decreased coordination, Increased edema  Visit Diagnosis: Chronic right shoulder pain  Muscle weakness (generalized)  Stiffness of right shoulder, not elsewhere classified  Localized edema     Problem List Patient Active Problem List   Diagnosis Date Noted   Unilateral conductive hearing loss 02/26/2021   Pre-syncope 02/26/2021   Complete tear of right rotator cuff    Biceps tendonitis on right    Degenerative superior labral anterior-to-posterior (SLAP) tear of right shoulder    Acute pain of right shoulder 01/11/2020   Blood pressure check 10/20/2019   Mood changes 04/12/2019   Acute midline low back  pain without sciatica 02/13/2019   Erectile dysfunction 07/23/2018   Ganglion cyst of dorsum of left wrist 04/20/2018   Prediabetes 04/17/2016   OBESITY, NOS 04/16/2006   Chyrel MassonMichael Shamarion Coots, PT, DPT, OCS, ATC 03/13/21  3:50 PM    [facility] [address] [place], [place], [address] Phone: [phone]   Fax:  [phone]  Name: Tony Nguyen MRN: [id number] Date of Birth: 01/22/1961

## 2021-03-15 ENCOUNTER — Other Ambulatory Visit: Payer: Self-pay

## 2021-03-15 ENCOUNTER — Encounter: Payer: Self-pay | Admitting: Rehabilitative and Restorative Service Providers"

## 2021-03-15 ENCOUNTER — Ambulatory Visit (INDEPENDENT_AMBULATORY_CARE_PROVIDER_SITE_OTHER): Payer: No Typology Code available for payment source | Admitting: Rehabilitative and Restorative Service Providers"

## 2021-03-15 DIAGNOSIS — M6281 Muscle weakness (generalized): Secondary | ICD-10-CM | POA: Diagnosis not present

## 2021-03-15 DIAGNOSIS — M25611 Stiffness of right shoulder, not elsewhere classified: Secondary | ICD-10-CM | POA: Diagnosis not present

## 2021-03-15 DIAGNOSIS — M25511 Pain in right shoulder: Secondary | ICD-10-CM | POA: Diagnosis not present

## 2021-03-15 DIAGNOSIS — G8929 Other chronic pain: Secondary | ICD-10-CM

## 2021-03-15 DIAGNOSIS — R600 Localized edema: Secondary | ICD-10-CM

## 2021-03-15 NOTE — Therapy (Addendum)
[facility] Physical Therapy [address] [place], [place], [address] Phone: [phone]   Fax:  [phone]  Physical Therapy Treatment  Patient Details  Name: Tony Nguyen MRN: [id number] Date of Birth: 12-Oct-1960 Referring Provider (PT): August Saucer Corrie Mckusick, MD   Encounter Date: 03/15/2021   PT End of Session - 03/15/21 1129     Visit Number 5    Number of Visits 20    Date for PT Re-Evaluation 05/08/21    Authorization Type [employer]    PT Start Time 1139    PT Stop Time 1218    PT Time Calculation (min) 39 min    Activity Tolerance Patient tolerated treatment well    Behavior During Therapy [facility] for tasks assessed/performed             Past Medical History:  Diagnosis Date   Bug bite 08/09/2018   Duodenal ulcer due to Helicobacter pylori 09/25/2010   EGD confirmed that duodenal ulcers and H. pylori gastritis in 08/2009. Treated with triple  therapy at [facility].    Gallbladder polyp 09/25/2010   GERD (gastroesophageal reflux disease)    Major depression in remission (HCC)    OTHER DISEASES OF NASAL CAVITY AND SINUSES 02/23/2008   Qualifier: Diagnosis of  By: Daphine Deutscher MD, [facility]     RHINITIS, CHRONIC 04/16/2006   Qualifier: Diagnosis of  By: Bebe Shaggy     Screening for prostate cancer 08/12/2019   TENNIS ELBOW 04/16/2006   Qualifier: Diagnosis of  By: Bebe Shaggy     Tinea corporis 06/01/2017    Past Surgical History:  Procedure Laterality Date   APPENDECTOMY  1981   CHOLECYSTECTOMY  10/15/10   Path results revealed chronic cholecystitis   ELBOW SURGERY  2005   right   SHOULDER ARTHROSCOPY WITH SUBACROMIAL DECOMPRESSION, ROTATOR CUFF REPAIR AND BICEP TENDON REPAIR Right 02/05/2021   Procedure: RIGHT SHOULDER ARTHROSCOPY, SUBACROMIAL DECOMPRESSION, DEBRIDEMENT, MINI OPEN ROTATOR CUFF TEAR REPAIR, BICEPS TENODESIS;  Surgeon: Cammy Copa, MD;  Location: [facility] OR;  Service: Orthopedics;  Laterality: Right;   UPPER  GASTROINTESTINAL ENDOSCOPY      There were no vitals filed for this visit.   Subjective Assessment - 03/15/21 1144     Subjective Pt. indicated no specific pain troubles since his last appointment.  Pt. indicated some improvement in ability to cook.    Limitations House hold activities;Lifting    Patient Stated Goals Reduce pain, return to work    Currently in Pain? No/denies    Pain Score 0-No pain    Pain Onset More than a month ago                [facility] PT Assessment - 03/15/21 0001       Assessment   Medical Diagnosis S46.011D (ICD-10-CM) - Traumatic complete tear of right rotator cuff, subsequent encounter    Referring Provider (PT) Cammy Copa, MD    Onset Date/Surgical Date 02/05/21    Hand Dominance Right      Strength   Overall Strength Comments Dynamometry measured in supine Rt shoulder    Right Shoulder Flexion --   in 90 deg flexion:  8.4, 10.6     lbs   Right Shoulder External Rotation --   c arm at side: 9.4, 8.5    lbs   Left Shoulder Flexion 5/5   in 90 deg flexion:   42.3    lbs   Left Shoulder External Rotation 5/5   c arm at side:  22, 24   lbs                          [facility] Adult PT Treatment/Exercise - 03/15/21 0001       Neuro Re-ed    Neuro Re-ed Details  stabilization holds in 100 deg flexion supine mild reisstances 15-20 second bouts      Shoulder Exercises: Supine   Flexion AROM;Right   2 x 10   Other Supine Exercises supine AAROM wand flexion x 10      Shoulder Exercises: Sidelying   External Rotation AROM;Right   c towel at side 3 x 10   ABduction Right;AROM   2 x 10     Shoulder Exercises: Standing   Flexion Other (comment);Right   UE ranger support 2 x 10   Flexion Limitations UE ranger support      Shoulder Exercises: Pulleys   Flexion 2 minutes    Scaption 2 minutes      Shoulder Exercises: ROM/Strengthening   UBE (Upper Arm Bike) Lvl 3 3 mins fwd/back each way      Manual Therapy   Manual therapy  comments Rt shoulder mobilization c movement posterior glide c ER passively for range gains                     PT Education - 03/15/21 1206     Education Details HEP progression    Person(s) Educated Patient    Methods Explanation;Demonstration;Verbal cues;Handout    Comprehension Returned demonstration;Verbalized understanding              PT Short Term Goals - 03/13/21 1514       PT SHORT TERM GOAL #1   Title Patient will demonstrate independent use of home exercise program to maintain progress from in clinic treatments.    Time 3    Period Weeks    Status Achieved    Target Date 03/20/21               PT Long Term Goals - 02/27/21 1344       PT LONG TERM GOAL #1   Title Patient will demonstrate/report pain at worst less than or equal to 2/10 to facilitate minimal limitation in daily activity secondary to pain symptoms.    Time 10    Period Weeks    Status New    Target Date 05/08/21      PT LONG TERM GOAL #2   Title Patient will demonstrate independent use of home exercise program to facilitate ability to maintain/progress functional gains from skilled physical therapy services.    Time 10    Period Weeks    Status New    Target Date 05/08/21      PT LONG TERM GOAL #3   Title Pt. will demonstrate FOTO outcome > or = 64 % to indicated reduced disability due to condition.    Time 10    Period Weeks    Status New    Target Date 05/08/21      PT LONG TERM GOAL #4   Title Patient will demonstrate Rt GH joint mobility WFL to facilitate usual self care, dressing, reaching overhead at PLOF s limitation due to symptoms.    Time 10    Period Weeks    Status New    Target Date 05/08/21      PT LONG TERM GOAL #5   Title Patient will demonstrate Rt UE MMT  5/5, with 15 % dynamometry of Lt throughout to facilitate usual lifting, carrying in functional activity to PLOF s limitation.    Time 10    Period Weeks    Status New    Target Date 05/08/21       Additional Long Term Goals   Additional Long Term Goals Yes      PT LONG TERM GOAL #6   Title Pt. will return to work at [employer] s limitation    Time 10    Period Weeks    Status New    Target Date 05/08/21                   Plan - 03/15/21 1202     Clinical Impression Statement Early assessment of strength for Rt shoulder c dynamometry within painfree range showed marked deficits in flexion/ER compared to Lt today.  Improving continued in AAROM mobility.  Fair control performance in AROM performed today.    Examination-Activity Limitations Reach Overhead;Lift;Hygiene/Grooming;Dressing;Carry;Sleep;Bed Mobility;Bathing    Examination-Participation Restrictions Occupation;Meal Prep;Community Activity;Cleaning;Interpersonal Relationship;Yard Work    Stability/Clinical Decision Making Stable/Uncomplicated    Rehab Potential Good    PT Frequency Other (comment)   2-3x/week   PT Duration Other (comment)   10 weeks   PT Treatment/Interventions ADLs/Self Care Home Management;Cryotherapy;Electrical Stimulation;Iontophoresis 4mg /ml Dexamethasone;Moist Heat;Balance training;Therapeutic exercise;Therapeutic activities;Functional mobility training;Stair training;Gait training;DME Instruction;Ultrasound;Neuromuscular re-education;Patient/family education;Passive range of motion;Spinal Manipulations;Joint Manipulations;Dry needling;Taping;Vasopneumatic Device;Manual techniques    PT Next Visit Plan AROM focus as tolerated     ROM/ISOMETRICS per referral (through 04/03/2021).  LTG reassessment    PT Home Exercise Plan [email or web address]    Consulted and Agree with Plan of Care Patient             Patient will benefit from skilled therapeutic intervention in order to improve the following deficits and impairments:  Decreased endurance, Hypomobility, Decreased activity tolerance, Decreased strength, Impaired UE functional use, Pain, Decreased mobility, Decreased range of motion, Impaired perceived  functional ability, Improper body mechanics, Impaired flexibility, Decreased coordination, Increased edema  Visit Diagnosis: Chronic right shoulder pain  Muscle weakness (generalized)  Stiffness of right shoulder, not elsewhere classified  Localized edema     Problem List Patient Active Problem List   Diagnosis Date Noted   Unilateral conductive hearing loss 02/26/2021   Pre-syncope 02/26/2021   Complete tear of right rotator cuff    Biceps tendonitis on right    Degenerative superior labral anterior-to-posterior (SLAP) tear of right shoulder    Acute pain of right shoulder 01/11/2020   Blood pressure check 10/20/2019   Mood changes 04/12/2019   Acute midline low back pain without sciatica 02/13/2019   Erectile dysfunction 07/23/2018   Ganglion cyst of dorsum of left wrist 04/20/2018   Prediabetes 04/17/2016   OBESITY, NOS 04/16/2006    Chyrel MassonMichael Scotland Dost, PT, DPT, OCS, ATC 03/15/21  12:16 PM    [facility] [address] [place], [place], [address] Phone: [phone]   Fax:  [phone]  Name: Tony Ruskbdul-Rashid I Nguyen MRN: [id number] Date of Birth: 10/10/1960

## 2021-03-15 NOTE — Patient Instructions (Signed)
Access Code: [email or web address] URL: [email or web address] Date: 03/15/2021 Prepared by: Chyrel Masson  Exercises Supine Shoulder External Rotation with Dowel at 20 Degrees of Abduction - 2-3 x daily - 7 x weekly - 1-2 sets - 10 reps - 5 hold Seated Scapular Retraction - 2-3 x daily - 7 x weekly - 1-2 sets - 10 reps - 5 hold Supine Shoulder Flexion Extension AAROM with Dowel - 2 x daily - 7 x weekly - 3 sets - 10 reps - 5 hold Standing Isometric Shoulder Internal Rotation at Doorway - 2 x daily - 7 x weekly - 1 sets - 10 reps - 5 hold Isometric Shoulder External Rotation at Wall (Mirrored) - 2 x daily - 7 x weekly - 1 sets - 10 reps - 5 hold Isometric Shoulder Abduction at Wall (Mirrored) - 1 x daily - 7 x weekly - 1 sets - 10 reps - 5 hold Isometric Shoulder Flexion at Wall (Mirrored) - 2 x daily - 7 x weekly - 1 sets - 15 reps - 5 hold Sidelying Shoulder Abduction Palm Forward - 2 x daily - 7 x weekly - 3 sets - 10-15 reps Sidelying Shoulder External Rotation (Mirrored) - 2 x daily - 7 x weekly - 3 sets - 10 reps Supine Shoulder Flexion Extension Full Range AROM - 2 x daily - 7 x weekly - 3 sets - 10 reps

## 2021-03-18 ENCOUNTER — Encounter: Payer: Self-pay | Admitting: Rehabilitative and Restorative Service Providers"

## 2021-03-18 ENCOUNTER — Ambulatory Visit (INDEPENDENT_AMBULATORY_CARE_PROVIDER_SITE_OTHER): Payer: No Typology Code available for payment source | Admitting: Rehabilitative and Restorative Service Providers"

## 2021-03-18 ENCOUNTER — Other Ambulatory Visit: Payer: Self-pay

## 2021-03-18 DIAGNOSIS — R600 Localized edema: Secondary | ICD-10-CM

## 2021-03-18 DIAGNOSIS — G8929 Other chronic pain: Secondary | ICD-10-CM

## 2021-03-18 DIAGNOSIS — M25611 Stiffness of right shoulder, not elsewhere classified: Secondary | ICD-10-CM | POA: Diagnosis not present

## 2021-03-18 DIAGNOSIS — M25511 Pain in right shoulder: Secondary | ICD-10-CM | POA: Diagnosis not present

## 2021-03-18 DIAGNOSIS — M6281 Muscle weakness (generalized): Secondary | ICD-10-CM | POA: Diagnosis not present

## 2021-03-18 NOTE — Therapy (Addendum)
[facility] [address] [place], [place], [address] Phone: [phone]   Fax:  [phone]  Physical Therapy Treatment  Patient Details  Name: Tony Nguyen MRN: [id number] Date of Birth: [birth year] Referring Provider (PT): Marlou Sa Tonna Corner, MD   Encounter Date: 03/18/2021   PT End of Session - 03/18/21 1501     Visit Number 6    Number of Visits 20    Date for PT Re-Evaluation 05/08/21    Authorization Type [employer]    PT Start Time 1431    PT Stop Time [id number]    PT Time Calculation (min) 40 min    Activity Tolerance Patient tolerated treatment well    Behavior During Therapy [facility] for tasks assessed/performed             Past Medical History:  Diagnosis Date   Bug bite 08/09/2018   Duodenal ulcer due to Helicobacter pylori [id number]   EGD confirmed that duodenal ulcers and H. pylori gastritis in 08/2009. Treated with triple  therapy at [place].    Gallbladder polyp 09/25/2010   GERD (gastroesophageal reflux disease)    Major depression in remission ([place])    OTHER DISEASES OF NASAL CAVITY AND SINUSES 02/23/2008   Qualifier: Diagnosis of  By: Hassell Done MD, [place], CHRONIC 04/16/2006   Qualifier: Diagnosis of  By: Herma Ard     Screening for prostate cancer 08/12/2019   TENNIS ELBOW 04/16/2006   Qualifier: Diagnosis of  By: Herma Ard     Tinea corporis 06/01/2017    Past Surgical History:  Procedure Laterality Date   APPENDECTOMY  1981   CHOLECYSTECTOMY  10/15/10   Path results revealed chronic cholecystitis   ELBOW SURGERY  2005   right   SHOULDER ARTHROSCOPY WITH SUBACROMIAL DECOMPRESSION, ROTATOR CUFF REPAIR AND BICEP TENDON REPAIR Right 02/05/2021   Procedure: RIGHT SHOULDER ARTHROSCOPY, SUBACROMIAL DECOMPRESSION, DEBRIDEMENT, MINI OPEN ROTATOR CUFF TEAR REPAIR, BICEPS TENODESIS;  Surgeon: Meredith Pel, MD;  Location: [place];  Service: Orthopedics;  Laterality: Right;   UPPER  GASTROINTESTINAL ENDOSCOPY      There were no vitals filed for this visit.   Subjective Assessment - 03/18/21 1434     Subjective Pt. indicated no pain complaints from the weekend.  Pt. stated no other specific comments about feeling while at home since last visit.    Limitations House hold activities;Lifting    Patient Stated Goals Reduce pain, return to work    Currently in Pain? No/denies    Pain Score 0-No pain    Pain Onset More than a month ago                [facility] PT Assessment - 03/18/21 0001       Assessment   Medical Diagnosis S46.011D (ICD-10-CM) - Traumatic complete tear of right rotator cuff, subsequent encounter    Referring Provider (PT) Meredith Pel, MD    Onset Date/Surgical Date 02/05/21    Hand Dominance Right      AROM   Right Shoulder Flexion 135 Degrees   in supine     PROM   Right Shoulder External Rotation 34 Degrees   in 45 deg abduction in supine                          [facility] Adult PT Treatment/Exercise - 03/18/21 0001       Neuro Re-ed    Neuro Re-ed Details  stabilization holds in 100 deg flexion supine mild reisstances 15-20 second bouts, ER/IR reactive isometrics 15-20 sec bouts in 45 deg abduction      Shoulder Exercises: Sidelying   External Rotation 20 reps;AROM    ABduction Right;20 reps      Shoulder Exercises: Standing   Other Standing Exercises UE ranger supported flexion, scaption 2 x 15 each      Shoulder Exercises: ROM/Strengthening   UBE (Upper Arm Bike) Lvl 3.5 3 mins fwd/back each way      Shoulder Exercises: Stretch   Other Shoulder Stretches 30 sec x 3 doorway stretch c arm at side            manual:  Rt shoulder ER passive stretching long duration stretch with overpressure  Scot Jun, PT, DPT, OCS, ATC 03/20/21  2:42 PM )            PT Short Term Goals - 03/13/21 1514       PT SHORT TERM GOAL #1   Title Patient will demonstrate independent use of home exercise  program to maintain progress from in clinic treatments.    Time 3    Period Weeks    Status Achieved    Target Date 03/20/21               PT Long Term Goals - 03/18/21 1502       PT LONG TERM GOAL #1   Title Patient will demonstrate/report pain at worst less than or equal to 2/10 to facilitate minimal limitation in daily activity secondary to pain symptoms.    Time 10    Period Weeks    Status On-going    Target Date 05/08/21      PT LONG TERM GOAL #2   Title Patient will demonstrate independent use of home exercise program to facilitate ability to maintain/progress functional gains from skilled physical therapy services.    Time 10    Period Weeks    Status On-going    Target Date 05/08/21      PT LONG TERM GOAL #3   Title Pt. will demonstrate FOTO outcome > or = 64 % to indicated reduced disability due to condition.    Time 10    Period Weeks    Status On-going    Target Date 05/08/21      PT LONG TERM GOAL #4   Title Patient will demonstrate Rt [place] joint mobility WFL to facilitate usual self care, dressing, reaching overhead at PLOF s limitation due to symptoms.    Time 10    Period Weeks    Status On-going    Target Date 05/08/21      PT LONG TERM GOAL #5   Title Patient will demonstrate Rt UE MMT 5/5, with 15 % dynamometry of Lt throughout to facilitate usual lifting, carrying in functional activity to PLOF s limitation.    Time 10    Period Weeks    Status On-going    Target Date 05/08/21      PT LONG TERM GOAL #6   Title Pt. will return to work at [employer] s limitation    Time 10    Period Weeks    Status On-going    Target Date 05/08/21                   Plan - 03/18/21 1501     Clinical Impression Statement Improving elevation noted with continued restriction in ER mobility as noted in passive  range data collection today.  Encouraged specific use of ER stretching for longer durations (30 sec plus) to promote improved mobility in area.     Examination-Activity Limitations Reach Overhead;Lift;Hygiene/Grooming;Dressing;Carry;Sleep;Bed Mobility;Bathing    Examination-Participation Restrictions Occupation;Meal Prep;Community Activity;Cleaning;Interpersonal Relationship;Yard Work    Stability/Clinical Decision Making Stable/Uncomplicated    Rehab Potential Good    PT Frequency Other (comment)   2-3x/week   PT Duration Other (comment)   10 weeks   PT Treatment/Interventions ADLs/Self Care Home Management;Cryotherapy;Electrical Stimulation;Iontophoresis 4mg /ml Dexamethasone;Moist Heat;Balance training;Therapeutic exercise;Therapeutic activities;Functional mobility training;Stair training;Gait training;DME Instruction;Ultrasound;Neuromuscular re-education;Patient/family education;Passive range of motion;Spinal Manipulations;Joint Manipulations;Dry needling;Taping;Vasopneumatic Device;Manual techniques    PT Next Visit Plan AROM focus as tolerated  ER mobility gains   ROM/ISOMETRICS per referral (through 04/03/2021).    PT Home Exercise Plan [id number]    Consulted and Agree with Plan of Care Patient             Patient will benefit from skilled therapeutic intervention in order to improve the following deficits and impairments:  Decreased endurance, Hypomobility, Decreased activity tolerance, Decreased strength, Impaired UE functional use, Pain, Decreased mobility, Decreased range of motion, Impaired perceived functional ability, Improper body mechanics, Impaired flexibility, Decreased coordination, Increased edema  Visit Diagnosis: Chronic right shoulder pain  Muscle weakness (generalized)  Stiffness of right shoulder, not elsewhere classified  Localized edema     Problem List Patient Active Problem List   Diagnosis Date Noted   Unilateral conductive hearing loss 02/26/2021   Pre-syncope 02/26/2021   Complete tear of right rotator cuff    Biceps tendonitis on right    Degenerative superior labral anterior-to-posterior  (SLAP) tear of right shoulder    Acute pain of right shoulder 01/11/2020   Blood pressure check 10/20/2019   Mood changes 04/12/2019   Acute midline low back pain without sciatica 02/13/2019   Erectile dysfunction 07/23/2018   Ganglion cyst of dorsum of left wrist 04/20/2018   Prediabetes 04/17/2016   OBESITY, NOS 04/16/2006    Scot Jun, PT, DPT, OCS, ATC 03/18/21  3:09 PM    [facility] [address] [place], [place], [address] Phone: [phone]   Fax:  [phone]  Name: JAHFARI JOYNER MRN: [id number] Date of Birth: 1960/10/31

## 2021-03-19 ENCOUNTER — Other Ambulatory Visit: Payer: Self-pay | Admitting: Orthopedic Surgery

## 2021-03-20 ENCOUNTER — Ambulatory Visit (INDEPENDENT_AMBULATORY_CARE_PROVIDER_SITE_OTHER): Payer: No Typology Code available for payment source | Admitting: Rehabilitative and Restorative Service Providers"

## 2021-03-20 ENCOUNTER — Other Ambulatory Visit: Payer: Self-pay

## 2021-03-20 ENCOUNTER — Encounter: Payer: Self-pay | Admitting: Rehabilitative and Restorative Service Providers"

## 2021-03-20 DIAGNOSIS — G8929 Other chronic pain: Secondary | ICD-10-CM

## 2021-03-20 DIAGNOSIS — M6281 Muscle weakness (generalized): Secondary | ICD-10-CM | POA: Diagnosis not present

## 2021-03-20 DIAGNOSIS — M25611 Stiffness of right shoulder, not elsewhere classified: Secondary | ICD-10-CM | POA: Diagnosis not present

## 2021-03-20 DIAGNOSIS — M25511 Pain in right shoulder: Secondary | ICD-10-CM | POA: Diagnosis not present

## 2021-03-20 DIAGNOSIS — R600 Localized edema: Secondary | ICD-10-CM

## 2021-03-20 NOTE — Therapy (Signed)
[facility] [address] [place], [place], [address] Phone: [phone]   Fax:  [phone]  Physical Therapy Treatment  Patient Details  Name: Tony Nguyen MRN: [id number] Date of Birth: Jan 26, 1961 Referring Provider (PT): August Saucer Corrie Mckusick, MD   Encounter Date: 03/20/2021   PT End of Session - 03/20/21 1434     Visit Number 7    Number of Visits 20    Date for PT Re-Evaluation 05/08/21    Authorization Type [employer]    PT Start Time 1428    PT Stop Time 1506    PT Time Calculation (min) 38 min    Activity Tolerance Patient tolerated treatment well    Behavior During Therapy [facility] for tasks assessed/performed             Past Medical History:  Diagnosis Date   Bug bite 08/09/2018   Duodenal ulcer due to Helicobacter pylori 09/25/2010   EGD confirmed that duodenal ulcers and H. pylori gastritis in 08/2009. Treated with triple  therapy at [facility].    Gallbladder polyp 09/25/2010   GERD (gastroesophageal reflux disease)    Major depression in remission ([facility])    OTHER DISEASES OF NASAL CAVITY AND SINUSES 02/23/2008   Qualifier: Diagnosis of  By: Daphine Deutscher MD, [facility]     RHINITIS, CHRONIC 04/16/2006   Qualifier: Diagnosis of  By: Bebe Shaggy     Screening for prostate cancer 08/12/2019   TENNIS ELBOW 04/16/2006   Qualifier: Diagnosis of  By: Bebe Shaggy     Tinea corporis 06/01/2017    Past Surgical History:  Procedure Laterality Date   APPENDECTOMY  1981   CHOLECYSTECTOMY  10/15/10   Path results revealed chronic cholecystitis   ELBOW SURGERY  2005   right   SHOULDER ARTHROSCOPY WITH SUBACROMIAL DECOMPRESSION, ROTATOR CUFF REPAIR AND BICEP TENDON REPAIR Right 02/05/2021   Procedure: RIGHT SHOULDER ARTHROSCOPY, SUBACROMIAL DECOMPRESSION, DEBRIDEMENT, MINI OPEN ROTATOR CUFF TEAR REPAIR, BICEPS TENODESIS;  Surgeon: Cammy Copa, MD;  Location: [facility];  Service: Orthopedics;  Laterality: Right;   UPPER  GASTROINTESTINAL ENDOSCOPY      There were no vitals filed for this visit.   Subjective Assessment - 03/20/21 1433     Subjective Pt. stated after last visit he went home and took some medicine due to shoulder aching but improved after sleeping and woke up without hurting.  No pain reported today.    Limitations House hold activities;Lifting    Patient Stated Goals Reduce pain, return to work    Currently in Pain? No/denies    Pain Score 0-No pain    Pain Onset More than a month ago                               [facility] Adult PT Treatment/Exercise - 03/20/21 0001       Neuro Re-ed    Neuro Re-ed Details  stabilization holds in 100 deg flexion supine mild reisstances 15-20 second bouts, ER/IR reactive isometrics 15-20 sec bouts in 45 deg abduction      Shoulder Exercises: Supine   Other Supine Exercises supine wand ER in 30 deg abduction 15 sec hold x 5 Rt arm      Shoulder Exercises: Sidelying   External Rotation Right   3 x 15 c towel at side   ABduction Right   3 x 10     Shoulder Exercises: Pulleys   Flexion 2 minutes  ABduction 2 minutes      Shoulder Exercises: ROM/Strengthening   UBE (Upper Arm Bike) Lvl 3.5 3 mins fwd/back each way      Manual Therapy   Manual therapy comments Contract/relax 5 sec/30 sec to Rt arm ER in 45 deg abduction                       PT Short Term Goals - 03/13/21 1514       PT SHORT TERM GOAL #1   Title Patient will demonstrate independent use of home exercise program to maintain progress from in clinic treatments.    Time 3    Period Weeks    Status Achieved    Target Date 03/20/21               PT Long Term Goals - 03/18/21 1502       PT LONG TERM GOAL #1   Title Patient will demonstrate/report pain at worst less than or equal to 2/10 to facilitate minimal limitation in daily activity secondary to pain symptoms.    Time 10    Period Weeks    Status On-going    Target Date 05/08/21       PT LONG TERM GOAL #2   Title Patient will demonstrate independent use of home exercise program to facilitate ability to maintain/progress functional gains from skilled physical therapy services.    Time 10    Period Weeks    Status On-going    Target Date 05/08/21      PT LONG TERM GOAL #3   Title Pt. will demonstrate FOTO outcome > or = 64 % to indicated reduced disability due to condition.    Time 10    Period Weeks    Status On-going    Target Date 05/08/21      PT LONG TERM GOAL #4   Title Patient will demonstrate Rt GH joint mobility WFL to facilitate usual self care, dressing, reaching overhead at PLOF s limitation due to symptoms.    Time 10    Period Weeks    Status On-going    Target Date 05/08/21      PT LONG TERM GOAL #5   Title Patient will demonstrate Rt UE MMT 5/5, with 15 % dynamometry of Lt throughout to facilitate usual lifting, carrying in functional activity to PLOF s limitation.    Time 10    Period Weeks    Status On-going    Target Date 05/08/21      PT LONG TERM GOAL #6   Title Pt. will return to work at [employer] s limitation    Time 10    Period Weeks    Status On-going    Target Date 05/08/21                   Plan - 03/20/21 1500     Clinical Impression Statement Pt. demonstrated mild improvements in ER mobility today, approaching 40/45 degrees passively by visual approximation.  Continued focus on AROM and isometric loading per protocol at this time.  Elevation against gravity improving slowly but progress noted.    Examination-Activity Limitations Reach Overhead;Lift;Hygiene/Grooming;Dressing;Carry;Sleep;Bed Mobility;Bathing    Examination-Participation Restrictions Occupation;Meal Prep;Community Activity;Cleaning;Interpersonal Relationship;Yard Work    Stability/Clinical Decision Making Stable/Uncomplicated    Rehab Potential Good    PT Frequency Other (comment)   2-3x/week   PT Duration Other (comment)   10 weeks   PT  Treatment/Interventions ADLs/Self Care Home Management;Cryotherapy;Lobbyistlectrical  Stimulation;Iontophoresis 4mg /ml Dexamethasone;Moist Heat;Balance training;Therapeutic exercise;Therapeutic activities;Functional mobility training;Stair training;Gait training;DME Instruction;Ultrasound;Neuromuscular re-education;Patient/family education;Passive range of motion;Spinal Manipulations;Joint Manipulations;Dry needling;Taping;Vasopneumatic Device;Manual techniques    PT Next Visit Plan AROM focus as tolerated  manual for ER mobility gains   ROM/ISOMETRICS per referral (through 04/03/2021).    PT Home Exercise Plan [id number]    Consulted and Agree with Plan of Care Patient             Patient will benefit from skilled therapeutic intervention in order to improve the following deficits and impairments:  Decreased endurance, Hypomobility, Decreased activity tolerance, Decreased strength, Impaired UE functional use, Pain, Decreased mobility, Decreased range of motion, Impaired perceived functional ability, Improper body mechanics, Impaired flexibility, Decreased coordination, Increased edema  Visit Diagnosis: Chronic right shoulder pain  Muscle weakness (generalized)  Stiffness of right shoulder, not elsewhere classified  Localized edema     Problem List Patient Active Problem List   Diagnosis Date Noted   Unilateral conductive hearing loss 02/26/2021   Pre-syncope 02/26/2021   Complete tear of right rotator cuff    Biceps tendonitis on right    Degenerative superior labral anterior-to-posterior (SLAP) tear of right shoulder    Acute pain of right shoulder 01/11/2020   Blood pressure check 10/20/2019   Mood changes 04/12/2019   Acute midline low back pain without sciatica 02/13/2019   Erectile dysfunction 07/23/2018   Ganglion cyst of dorsum of left wrist 04/20/2018   Prediabetes 04/17/2016   OBESITY, NOS 04/16/2006    04/18/2006, PT, DPT, OCS, ATC 03/20/21  3:06 PM    [facility] [address] [place], [place], [place] Phone: [phone]   Fax:  [phone]  Name: Tony Nguyen MRN: [id number] [id number] Date of Birth: 05-12-1960

## 2021-03-25 ENCOUNTER — Ambulatory Visit (INDEPENDENT_AMBULATORY_CARE_PROVIDER_SITE_OTHER): Payer: No Typology Code available for payment source | Admitting: Rehabilitative and Restorative Service Providers"

## 2021-03-25 ENCOUNTER — Encounter: Payer: Self-pay | Admitting: Rehabilitative and Restorative Service Providers"

## 2021-03-25 ENCOUNTER — Other Ambulatory Visit: Payer: Self-pay

## 2021-03-25 DIAGNOSIS — G8929 Other chronic pain: Secondary | ICD-10-CM

## 2021-03-25 DIAGNOSIS — M25611 Stiffness of right shoulder, not elsewhere classified: Secondary | ICD-10-CM

## 2021-03-25 DIAGNOSIS — M25511 Pain in right shoulder: Secondary | ICD-10-CM

## 2021-03-25 DIAGNOSIS — M6281 Muscle weakness (generalized): Secondary | ICD-10-CM

## 2021-03-25 DIAGNOSIS — R600 Localized edema: Secondary | ICD-10-CM

## 2021-03-25 NOTE — Therapy (Signed)
[facility] Physical Therapy [address] [place], [place], [address] Phone: [phone]   Fax:  [phone]  Physical Therapy Treatment  Patient Details  Name: Tony Nguyen MRN: [id number] Date of Birth: Sep 11, 1960 Referring Provider (PT): Marlou Sa Tonna Corner, MD   Encounter Date: 03/25/2021   PT End of Session - 03/25/21 1436     Visit Number 8    Number of Visits 20    Date for PT Re-Evaluation 05/08/21    Authorization Type [employer]    PT Start Time 1430    PT Stop Time 1509    PT Time Calculation (min) 39 min    Activity Tolerance Patient tolerated treatment well    Behavior During Therapy [facility] for tasks assessed/performed             Past Medical History:  Diagnosis Date   Bug bite 08/09/2018   Duodenal ulcer due to Helicobacter pylori [id number]   EGD confirmed that duodenal ulcers and H. pylori gastritis in 08/2009. Treated with triple  therapy at [place].    Gallbladder polyp 09/25/2010   GERD (gastroesophageal reflux disease)    Major depression in remission ([place])    OTHER DISEASES OF NASAL CAVITY AND SINUSES 02/23/2008   Qualifier: Diagnosis of  By: Hassell Done MD, [place], CHRONIC 04/16/2006   Qualifier: Diagnosis of  By: Herma Ard     Screening for prostate cancer 08/12/2019   TENNIS ELBOW 04/16/2006   Qualifier: Diagnosis of  By: Herma Ard     Tinea corporis 06/01/2017    Past Surgical History:  Procedure Laterality Date   APPENDECTOMY  1981   CHOLECYSTECTOMY  10/15/10   Path results revealed chronic cholecystitis   ELBOW SURGERY  2005   right   SHOULDER ARTHROSCOPY WITH SUBACROMIAL DECOMPRESSION, ROTATOR CUFF REPAIR AND BICEP TENDON REPAIR Right 02/05/2021   Procedure: RIGHT SHOULDER ARTHROSCOPY, SUBACROMIAL DECOMPRESSION, DEBRIDEMENT, MINI OPEN ROTATOR CUFF TEAR REPAIR, BICEPS TENODESIS;  Surgeon: Meredith Pel, MD;  Location: [facility];  Service: Orthopedics;  Laterality: Right;   UPPER  GASTROINTESTINAL ENDOSCOPY      There were no vitals filed for this visit.   Subjective Assessment - 03/25/21 1435     Subjective Pt. indicated some variable complaints in Rt shoulder for 30 mins at a time noted in front of shoulder and Rt upper arm at times.  Maybe related to Rt shoulder lying pressure per Pt.    Limitations House hold activities;Lifting    Patient Stated Goals Reduce pain, return to work    Currently in Pain? Yes    Pain Score 5     Pain Location Shoulder    Pain Orientation Right    Pain Descriptors / Indicators Aching;Sore    Pain Type Chronic pain;Surgical pain    Pain Onset More than a month ago    Pain Frequency Intermittent    Aggravating Factors  maybe lying on it, end range tightness    Pain Relieving Factors not sure                [facility] PT Assessment - 03/25/21 0001       Assessment   Medical Diagnosis S46.011D (ICD-10-CM) - Traumatic complete tear of right rotator cuff, subsequent encounter    Referring Provider (PT) Marlou Sa Tonna Corner, MD    Onset Date/Surgical Date 02/05/21    Hand Dominance Right      Observation/Other Assessments   Focus on Therapeutic Outcomes (FOTO)  updated 52%  AROM   Right Shoulder Flexion 150 Degrees   in supine   Right Shoulder ABduction 105 Degrees   in supine, end range pain noted   Right Shoulder Internal Rotation 50 Degrees   in 45 deg abduction supine pain at end range   Right Shoulder External Rotation 40 Degrees   in 45 deg abduction supine pain at end range                          [facility] Adult PT Treatment/Exercise - 03/25/21 0001       Shoulder Exercises: Supine   Other Supine Exercises supine ER/IR in 70 deg abduction 5 sec hold x 10 each way      Shoulder Exercises: Sidelying   External Rotation Right   3 x 10 c towel at side   ABduction Right   3 x 12     Shoulder Exercises: Standing   Other Standing Exercises UE ranger supported flexion, scaption x 15 each       Shoulder Exercises: ROM/Strengthening   UBE (Upper Arm Bike) Lvl 3 3 mins fwd/back each way (reduced due to the pain reported upon arrival).      Manual Therapy   Manual therapy comments supine g3 inferior Rt GH joint mobs in flexion, scaption and abduction, mobilization c movement c posterior glide c ER, compression c movement to Rt infraspinatus                       PT Short Term Goals - 03/13/21 1514       PT SHORT TERM GOAL #1   Title Patient will demonstrate independent use of home exercise program to maintain progress from in clinic treatments.    Time 3    Period Weeks    Status Achieved    Target Date 03/20/21               PT Long Term Goals - 03/18/21 1502       PT LONG TERM GOAL #1   Title Patient will demonstrate/report pain at worst less than or equal to 2/10 to facilitate minimal limitation in daily activity secondary to pain symptoms.    Time 10    Period Weeks    Status On-going    Target Date 05/08/21      PT LONG TERM GOAL #2   Title Patient will demonstrate independent use of home exercise program to facilitate ability to maintain/progress functional gains from skilled physical therapy services.    Time 10    Period Weeks    Status On-going    Target Date 05/08/21      PT LONG TERM GOAL #3   Title Pt. will demonstrate FOTO outcome > or = 64 % to indicated reduced disability due to condition.    Time 10    Period Weeks    Status On-going    Target Date 05/08/21      PT LONG TERM GOAL #4   Title Patient will demonstrate Rt Strong joint mobility WFL to facilitate usual self care, dressing, reaching overhead at PLOF s limitation due to symptoms.    Time 10    Period Weeks    Status On-going    Target Date 05/08/21      PT LONG TERM GOAL #5   Title Patient will demonstrate Rt UE MMT 5/5, with 15 % dynamometry of Lt throughout to facilitate usual lifting, carrying in functional activity  to PLOF s limitation.    Time 10    Period Weeks     Status On-going    Target Date 05/08/21      PT LONG TERM GOAL #6   Title Pt. will return to work at [employer] s limitation    Time 10    Period Weeks    Status On-going    Target Date 05/08/21                   Plan - 03/25/21 1456     Clinical Impression Statement Pt. has continued complaints of variable intermittent Rt shoulder/upper arm pain with mobility deficits noted as listed.  Improvements have been noted overall in mobility compared to previous but impairments are still noted.  Continued skilled PT services indicated at this time.    Examination-Activity Limitations Reach Overhead;Lift;Hygiene/Grooming;Dressing;Carry;Sleep;Bed Mobility;Bathing    Examination-Participation Restrictions Occupation;Meal Prep;Community Activity;Cleaning;Interpersonal Relationship;Yard Work    Stability/Clinical Decision Making Stable/Uncomplicated    Rehab Potential Good    PT Frequency Other (comment)   2-3x/week   PT Duration Other (comment)   10 weeks   PT Treatment/Interventions ADLs/Self Care Home Management;Cryotherapy;Electrical Stimulation;Iontophoresis 4mg /ml Dexamethasone;Moist Heat;Balance training;Therapeutic exercise;Therapeutic activities;Functional mobility training;Stair training;Gait training;DME Instruction;Ultrasound;Neuromuscular re-education;Patient/family education;Passive range of motion;Spinal Manipulations;Joint Manipulations;Dry needling;Taping;Vasopneumatic Device;Manual techniques    PT Next Visit Plan ROM/ISOMETRICS per referral (through 04/03/2021).    PT Home Exercise Plan [email or web address]    Consulted and Agree with Plan of Care Patient             Patient will benefit from skilled therapeutic intervention in order to improve the following deficits and impairments:  Decreased endurance, Hypomobility, Decreased activity tolerance, Decreased strength, Impaired UE functional use, Pain, Decreased mobility, Decreased range of motion, Impaired perceived functional  ability, Improper body mechanics, Impaired flexibility, Decreased coordination, Increased edema  Visit Diagnosis: Chronic right shoulder pain  Muscle weakness (generalized)  Stiffness of right shoulder, not elsewhere classified  Localized edema     Problem List Patient Active Problem List   Diagnosis Date Noted   Unilateral conductive hearing loss 02/26/2021   Pre-syncope 02/26/2021   Complete tear of right rotator cuff    Biceps tendonitis on right    Degenerative superior labral anterior-to-posterior (SLAP) tear of right shoulder    Acute pain of right shoulder 01/11/2020   Blood pressure check 10/20/2019   Mood changes 04/12/2019   Acute midline low back pain without sciatica 02/13/2019   Erectile dysfunction 07/23/2018   Ganglion cyst of dorsum of left wrist 04/20/2018   Prediabetes 04/17/2016   OBESITY, NOS 04/16/2006    Scot Jun, PT, DPT, OCS, ATC 03/25/21  3:08 PM    [facility] [address] [place], [place], [address] Phone: [phone]   Fax:  [phone]  Name: Tony Nguyen MRN: [id number] Date of Birth: 07/24/1960

## 2021-03-27 ENCOUNTER — Other Ambulatory Visit: Payer: Self-pay | Admitting: Family Medicine

## 2021-03-27 ENCOUNTER — Encounter: Payer: Self-pay | Admitting: Rehabilitative and Restorative Service Providers"

## 2021-03-27 ENCOUNTER — Ambulatory Visit (INDEPENDENT_AMBULATORY_CARE_PROVIDER_SITE_OTHER): Payer: No Typology Code available for payment source | Admitting: Rehabilitative and Restorative Service Providers"

## 2021-03-27 ENCOUNTER — Other Ambulatory Visit: Payer: Self-pay

## 2021-03-27 DIAGNOSIS — G8929 Other chronic pain: Secondary | ICD-10-CM

## 2021-03-27 DIAGNOSIS — M25511 Pain in right shoulder: Secondary | ICD-10-CM

## 2021-03-27 DIAGNOSIS — M6281 Muscle weakness (generalized): Secondary | ICD-10-CM

## 2021-03-27 DIAGNOSIS — M25611 Stiffness of right shoulder, not elsewhere classified: Secondary | ICD-10-CM

## 2021-03-27 DIAGNOSIS — R600 Localized edema: Secondary | ICD-10-CM

## 2021-03-27 NOTE — Progress Notes (Signed)
[employer] worker records request received for [employer].  Form placed in Medical Records request box.   Katha Cabal, DO 03/27/21

## 2021-03-27 NOTE — Patient Instructions (Signed)
Access Code: [email or web address] URL: [email or web address] Date: 03/27/2021 Prepared by: Scot Jun  Exercises Seated Scapular Retraction - 2-3 x daily - 7 x weekly - 1-2 sets - 10 reps - 5 hold Isometric Shoulder External Rotation at Wall (Mirrored) - 2 x daily - 7 x weekly - 1 sets - 10 reps - 5 hold Isometric Shoulder Abduction at Wall (Mirrored) - 1 x daily - 7 x weekly - 1 sets - 10 reps - 5 hold Isometric Shoulder Flexion at Wall (Mirrored) - 2 x daily - 7 x weekly - 1 sets - 15 reps - 5 hold Sidelying Shoulder Abduction Palm Forward - 2 x daily - 7 x weekly - 3 sets - 10-15 reps Sidelying Shoulder External Rotation (Mirrored) - 2 x daily - 7 x weekly - 3 sets - 10 reps Corner [place]ec Major Stretch - 2-3 x daily - 7 x weekly - 1 sets - 5 reps - 30 hold Standing Shoulder Flexion to 90 Degrees with Dumbbells - 1-2 x daily - 7 x weekly - 2-3 sets - 10 reps Prone Single Arm Shoulder Horizontal Abduction with Scapular Retraction and Palm Down (Mirrored) - 1-2 x daily - 7 x weekly - 2-3 sets - 10 reps

## 2021-03-27 NOTE — Therapy (Signed)
[facility] Physical Therapy [address] [place], [place], [address] Phone: [phone]   Fax:  [phone]  Physical Therapy Treatment  Patient Details  Name: Tony Nguyen MRN: [id number] Date of Birth: 11-24-1960 Referring Provider (PT): August Saucer Corrie Mckusick, MD   Encounter Date: 03/27/2021   PT End of Session - 03/27/21 1423     Visit Number 9    Number of Visits 20    Date for PT Re-Evaluation 05/08/21    Authorization Type [employer]    PT Start Time 1424    PT Stop Time 1502    PT Time Calculation (min) 38 min    Activity Tolerance Patient tolerated treatment well    Behavior During Therapy [facility] for tasks assessed/performed             Past Medical History:  Diagnosis Date   Bug bite 08/09/2018   Duodenal ulcer due to Helicobacter pylori 09/25/2010   EGD confirmed that duodenal ulcers and H. pylori gastritis in 08/2009. Treated with triple  therapy at [facility].    Gallbladder polyp 09/25/2010   GERD (gastroesophageal reflux disease)    Major depression in remission ([facility])    OTHER DISEASES OF NASAL CAVITY AND SINUSES 02/23/2008   Qualifier: Diagnosis of  By: Daphine Deutscher MD, [facility]     RHINITIS, CHRONIC 04/16/2006   Qualifier: Diagnosis of  By: Bebe Shaggy     Screening for prostate cancer 08/12/2019   TENNIS ELBOW 04/16/2006   Qualifier: Diagnosis of  By: Bebe Shaggy     Tinea corporis 06/01/2017    Past Surgical History:  Procedure Laterality Date   APPENDECTOMY  1981   CHOLECYSTECTOMY  10/15/10   Path results revealed chronic cholecystitis   ELBOW SURGERY  2005   right   SHOULDER ARTHROSCOPY WITH SUBACROMIAL DECOMPRESSION, ROTATOR CUFF REPAIR AND BICEP TENDON REPAIR Right 02/05/2021   Procedure: RIGHT SHOULDER ARTHROSCOPY, SUBACROMIAL DECOMPRESSION, DEBRIDEMENT, MINI OPEN ROTATOR CUFF TEAR REPAIR, BICEPS TENODESIS;  Surgeon: Cammy Copa, MD;  [facility];  Service: Orthopedics;  Laterality: Right;   UPPER  GASTROINTESTINAL ENDOSCOPY      There were no vitals filed for this visit.   Subjective Assessment - 03/27/21 1426     Subjective Pt. reported not feeling pain at rest upon arrival today.  Pt. indicated he thought the manual intervention seemed to help symptoms.    Limitations House hold activities;Lifting    Patient Stated Goals Reduce pain, return to work    Currently in Pain? No/denies    Pain Onset More than a month ago                               [facility] Adult PT Treatment/Exercise - 03/27/21 0001       Neuro Re-ed    Neuro Re-ed Details  stabilization holds in 100 deg flexion /scaption supine mild reisstances 15-20 second bouts      Shoulder Exercises: Seated   Other Seated Exercises seated green biceps curl 3x10      Shoulder Exercises: Prone   Other Prone Exercises prone horizontal abduction 20x, scaption 2 x 10 within tolerated range      Shoulder Exercises: Sidelying   External Rotation Right   2 x 15   Flexion Right   x15     Shoulder Exercises: Standing   Flexion Both   0-90 degrees 2 x 10     Shoulder Exercises: Pulleys   Flexion  2 minutes    ABduction 2 minutes      Manual Therapy   Manual therapy comments percussive device to Rt infraspinatus trigger points, device compression c ER movement Rt shoulder                     PT Education - 03/27/21 1446     Education Details HEP progression    Person(s) Educated Patient    Methods Explanation;Demonstration;Verbal cues;Handout    Comprehension Returned demonstration;Verbalized understanding              PT Short Term Goals - 03/13/21 1514       PT SHORT TERM GOAL #1   Title Patient will demonstrate independent use of home exercise program to maintain progress from in clinic treatments.    Time 3    Period Weeks    Status Achieved    Target Date 03/20/21               PT Long Term Goals - 03/18/21 1502       PT LONG TERM GOAL #1   Title Patient will  demonstrate/report pain at worst less than or equal to 2/10 to facilitate minimal limitation in daily activity secondary to pain symptoms.    Time 10    Period Weeks    Status On-going    Target Date 05/08/21      PT LONG TERM GOAL #2   Title Patient will demonstrate independent use of home exercise program to facilitate ability to maintain/progress functional gains from skilled physical therapy services.    Time 10    Period Weeks    Status On-going    Target Date 05/08/21      PT LONG TERM GOAL #3   Title Pt. will demonstrate FOTO outcome > or = 64 % to indicated reduced disability due to condition.    Time 10    Period Weeks    Status On-going    Target Date 05/08/21      PT LONG TERM GOAL #4   Title Patient will demonstrate Rt GH joint mobility WFL to facilitate usual self care, dressing, reaching overhead at PLOF s limitation due to symptoms.    Time 10    Period Weeks    Status On-going    Target Date 05/08/21      PT LONG TERM GOAL #5   Title Patient will demonstrate Rt UE MMT 5/5, with 15 % dynamometry of Lt throughout to facilitate usual lifting, carrying in functional activity to PLOF s limitation.    Time 10    Period Weeks    Status On-going    Target Date 05/08/21      PT LONG TERM GOAL #6   Title Pt. will return to work at [employer] s limitation    Time 10    Period Weeks    Status On-going    Target Date 05/08/21                   Plan - 03/27/21 1454     Clinical Impression Statement Pt. continued to show improvement in overall quality of ER mobility.  Still demonstrated limits in ER/abduction end range mobility compared to normals but getting better overall.  Pt. to benefit from scapular mobility improvements ( upward rotation, retraction from improved lower trap, middle trap activation).  Limited due to pain in scaption in prone at this time.    Examination-Activity Limitations Reach Overhead;Lift;Hygiene/Grooming;Dressing;Carry;Sleep;Bed  Mobility;Bathing  Examination-Participation Restrictions Occupation;Meal Prep;Community Activity;Cleaning;Interpersonal Relationship;Yard Work    Stability/Clinical Decision Making Stable/Uncomplicated    Rehab Potential Good    PT Frequency Other (comment)   2-3x/week   PT Duration Other (comment)   10 weeks   PT Treatment/Interventions ADLs/Self Care Home Management;Cryotherapy;Electrical Stimulation;Iontophoresis 4mg /ml Dexamethasone;Moist Heat;Balance training;Therapeutic exercise;Therapeutic activities;Functional mobility training;Stair training;Gait training;DME Instruction;Ultrasound;Neuromuscular re-education;Patient/family education;Passive range of motion;Spinal Manipulations;Joint Manipulations;Dry needling;Taping;Vasopneumatic Device;Manual techniques    PT Next Visit Plan scapular mobility improvements, ROM/ISOMETRICS per referral (through 04/03/2021).    PT Home Exercise Plan [email or web address]    Consulted and Agree with Plan of Care Patient             Patient will benefit from skilled therapeutic intervention in order to improve the following deficits and impairments:  Decreased endurance, Hypomobility, Decreased activity tolerance, Decreased strength, Impaired UE functional use, Pain, Decreased mobility, Decreased range of motion, Impaired perceived functional ability, Improper body mechanics, Impaired flexibility, Decreased coordination, Increased edema  Visit Diagnosis: Chronic right shoulder pain  Muscle weakness (generalized)  Stiffness of right shoulder, not elsewhere classified  Localized edema     Problem List Patient Active Problem List   Diagnosis Date Noted   Unilateral conductive hearing loss 02/26/2021   Pre-syncope 02/26/2021   Complete tear of right rotator cuff    Biceps tendonitis on right    Degenerative superior labral anterior-to-posterior (SLAP) tear of right shoulder    Acute pain of right shoulder 01/11/2020   Blood pressure check 10/20/2019    Mood changes 04/12/2019   Acute midline low back pain without sciatica 02/13/2019   Erectile dysfunction 07/23/2018   Ganglion cyst of dorsum of left wrist 04/20/2018   Prediabetes 04/17/2016   OBESITY, NOS 04/16/2006   04/18/2006, PT, DPT, OCS, ATC 03/27/21  3:01 PM    [facility] Physical Therapy [address] [place], [place], [place] Phone: [phone]   Fax:  [phone]  Name: Tony Nguyen MRN: Annye Rusk Date of Birth: Sep 11, 1960

## 2021-04-01 ENCOUNTER — Encounter: Payer: No Typology Code available for payment source | Admitting: Rehabilitative and Restorative Service Providers"

## 2021-04-01 ENCOUNTER — Telehealth: Payer: Self-pay | Admitting: Rehabilitative and Restorative Service Providers"

## 2021-04-01 NOTE — Telephone Encounter (Signed)
I called and left message for patient about missed appointment today for 2:30 pm.  Reminded of next appointment time on Feb 15 at 2:30pm.  Chyrel Masson, PT, DPT, OCS, ATC 04/01/21  2:48 PM

## 2021-04-03 ENCOUNTER — Telehealth: Payer: Self-pay | Admitting: Rehabilitative and Restorative Service Providers"

## 2021-04-03 ENCOUNTER — Encounter: Payer: No Typology Code available for payment source | Admitting: Rehabilitative and Restorative Service Providers"

## 2021-04-03 NOTE — Telephone Encounter (Signed)
Patient indicated he didn't know about today's appointment, thinking it was for tomorrow.  Patient does have an MD appointment tomorrow and I reminded him of it.  Next physical therapy appointment on Monday Feb 20th.  Patient also indicated he missed the last appointment because of sleeping wrong on hand and he didn't have a number to call to cancel per his report.    Pt. Has had 2 no shows this week.  Plan to keep next appointment.  If missed, Pt. Will have to call and schedule per appointment.    Chyrel Masson, PT, DPT, OCS, ATC 04/03/21  2:49 PM

## 2021-04-04 ENCOUNTER — Ambulatory Visit (INDEPENDENT_AMBULATORY_CARE_PROVIDER_SITE_OTHER): Payer: No Typology Code available for payment source | Admitting: Orthopedic Surgery

## 2021-04-04 ENCOUNTER — Other Ambulatory Visit: Payer: Self-pay

## 2021-04-04 ENCOUNTER — Encounter: Payer: Self-pay | Admitting: Orthopedic Surgery

## 2021-04-04 DIAGNOSIS — Z9889 Other specified postprocedural states: Secondary | ICD-10-CM

## 2021-04-04 NOTE — Progress Notes (Signed)
Post-Op Visit Note   Patient: Tony Nguyen           Date of Birth: January 03, 1961           MRN: [id number] Visit Date: 04/04/2021 PCP: Katha Cabal, DO   Assessment & Plan:  Chief Complaint:  Chief Complaint  Patient presents with   Right Shoulder - Routine Post Op      02/05/21--Right shoulder arthroscopy with limited debridement of the superior labrum, biceps tendon release, biceps tenodesis and mini open rotator cuff tear repair.      Visit Diagnoses: No diagnosis found.  Plan: Patient is a 60 year old male who presents s/p right shoulder rotator cuff tear repair with biceps tenodesis on 02/05/2021.  He states that he is progressing well and still noticing progress every week with no plateaued yet.  He is in physical therapy 2-3 times per week.  Mostly working with the shoulder pulley and doing range of motion exercises with some early strengthening exercises.  Pain is improving and has gotten to the point where he only has to take occasional ibuprofen to control his pain.  He does state that he cannot sleep on his right shoulder yet and he has tried which has flared his pain up in the last couple days.  Not using the sling any longer.  He notes in particular that he has pain with forward flexion above 90 degrees and he notices an occasional clicking sensation anteriorly when he is doing the shoulder bike and physical therapy.  On exam he has 50 degrees external rotation, 70 degrees abduction, 125 degrees forward flexion.  Incisions are well-healed without evidence of infection.  No coarseness or grinding noted with passive motion of the shoulder.  He has 5/5 motor strength of supraspinatus and subscapularis with 5 -/5 external rotation strength today.  Full active range of motion equivalent to passive range of motion.  Plan is continue with physical therapy.  Okay to start strengthening exercises.  Follow-up in 4 weeks.  Follow-Up Instructions: No follow-ups on file.    Orders:  No orders of the defined types were placed in this encounter.  No orders of the defined types were placed in this encounter.   Imaging: No results found.  PMFS History: Patient Active Problem List   Diagnosis Date Noted   Unilateral conductive hearing loss 02/26/2021   Pre-syncope 02/26/2021   Complete tear of right rotator cuff    Biceps tendonitis on right    Degenerative superior labral anterior-to-posterior (SLAP) tear of right shoulder    Acute pain of right shoulder 01/11/2020   Blood pressure check 10/20/2019   Mood changes 04/12/2019   Acute midline low back pain without sciatica 02/13/2019   Erectile dysfunction 07/23/2018   Ganglion cyst of dorsum of left wrist 04/20/2018   Prediabetes 04/17/2016   OBESITY, NOS 04/16/2006   Past Medical History:  Diagnosis Date   Bug bite 08/09/2018   Duodenal ulcer due to Helicobacter pylori 09/25/2010   EGD confirmed that duodenal ulcers and H. pylori gastritis in 08/2009. Treated with triple  therapy at [facility].    Gallbladder polyp 09/25/2010   GERD (gastroesophageal reflux disease)    Major depression in remission [facility])    OTHER DISEASES OF NASAL CAVITY AND SINUSES 02/23/2008   Qualifier: Diagnosis of  By: Daphine Deutscher MD, [facility]     RHINITIS, CHRONIC 04/16/2006   Qualifier: Diagnosis of  By: Bebe Shaggy     Screening for prostate cancer 08/12/2019   TENNIS  ELBOW 04/16/2006   Qualifier: Diagnosis of  By: Bebe Shaggy     Tinea corporis 06/01/2017    Family History  Problem Relation Age of Onset   High blood pressure Father    Kidney disease Cousin    Diabetes Neg Hx    Early death Neg Hx    Heart disease Neg Hx    Hyperlipidemia Neg Hx    Hypertension Neg Hx     Past Surgical History:  Procedure Laterality Date   APPENDECTOMY  1981   CHOLECYSTECTOMY  10/15/10   Path results revealed chronic cholecystitis   ELBOW SURGERY  2005   right   SHOULDER ARTHROSCOPY WITH SUBACROMIAL DECOMPRESSION,  ROTATOR CUFF REPAIR AND BICEP TENDON REPAIR Right 02/05/2021   Procedure: RIGHT SHOULDER ARTHROSCOPY, SUBACROMIAL DECOMPRESSION, DEBRIDEMENT, MINI OPEN ROTATOR CUFF TEAR REPAIR, BICEPS TENODESIS;  Surgeon: Cammy Copa, MD;  Location: [facility];  Service: Orthopedics;  Laterality: Right;   UPPER GASTROINTESTINAL ENDOSCOPY     Social History   Occupational History   Occupation: Financial planner: [employer]    Comment: [employer]  Tobacco Use   Smoking status: Never   Smokeless tobacco: Never  Vaping Use   Vaping Use: Never used  Substance and Sexual Activity   Alcohol use: No   Drug use: No   Sexual activity: Yes

## 2021-04-08 ENCOUNTER — Ambulatory Visit (INDEPENDENT_AMBULATORY_CARE_PROVIDER_SITE_OTHER): Payer: No Typology Code available for payment source | Admitting: Rehabilitative and Restorative Service Providers"

## 2021-04-08 ENCOUNTER — Other Ambulatory Visit: Payer: Self-pay

## 2021-04-08 ENCOUNTER — Encounter: Payer: Self-pay | Admitting: Rehabilitative and Restorative Service Providers"

## 2021-04-08 DIAGNOSIS — G8929 Other chronic pain: Secondary | ICD-10-CM

## 2021-04-08 DIAGNOSIS — R600 Localized edema: Secondary | ICD-10-CM

## 2021-04-08 DIAGNOSIS — M6281 Muscle weakness (generalized): Secondary | ICD-10-CM

## 2021-04-08 DIAGNOSIS — M25511 Pain in right shoulder: Secondary | ICD-10-CM

## 2021-04-08 DIAGNOSIS — M25611 Stiffness of right shoulder, not elsewhere classified: Secondary | ICD-10-CM | POA: Diagnosis not present

## 2021-04-08 NOTE — Therapy (Signed)
[facility] [address] [place], [place], [address] Phone: [phone]   Fax:  [phone]  Physical Therapy Treatment  Patient Details  Name: Tony Nguyen MRN: [id number] Date of Birth: [birth year] Referring Provider (PT): August Saucer Corrie Mckusick, MD   Encounter Date: 04/08/2021   PT End of Session - 04/08/21 1431     Visit Number 10    Number of Visits 20    Date for PT Re-Evaluation 05/08/21    Authorization Type [employer]    PT Start Time 1428    PT Stop Time 1507    PT Time Calculation (min) 39 min    Activity Tolerance Patient tolerated treatment well    Behavior During Therapy [facility] for tasks assessed/performed             Past Medical History:  Diagnosis Date   Bug bite 08/09/2018   Duodenal ulcer due to Helicobacter pylori 09/25/2010   EGD confirmed that duodenal ulcers and H. pylori gastritis in 08/2009. Treated with triple  therapy at [facility].    Gallbladder polyp 09/25/2010   GERD (gastroesophageal reflux disease)    Major depression in remission (HCC)    OTHER DISEASES OF NASAL CAVITY AND SINUSES 02/23/2008   Qualifier: Diagnosis of  By: Daphine Deutscher MD, [facility]     RHINITIS, CHRONIC 04/16/2006   Qualifier: Diagnosis of  By: Bebe Shaggy     Screening for prostate cancer 08/12/2019   TENNIS ELBOW 04/16/2006   Qualifier: Diagnosis of  By: Bebe Shaggy     Tinea corporis 06/01/2017    Past Surgical History:  Procedure Laterality Date   APPENDECTOMY  1981   CHOLECYSTECTOMY  10/15/10   Path results revealed chronic cholecystitis   ELBOW SURGERY  2005   right   SHOULDER ARTHROSCOPY WITH SUBACROMIAL DECOMPRESSION, ROTATOR CUFF REPAIR AND BICEP TENDON REPAIR Right 02/05/2021   Procedure: RIGHT SHOULDER ARTHROSCOPY, SUBACROMIAL DECOMPRESSION, DEBRIDEMENT, MINI OPEN ROTATOR CUFF TEAR REPAIR, BICEPS TENODESIS;  Surgeon: Cammy Copa, MD;  Location: [facility] OR;  Service: Orthopedics;  Laterality: Right;   UPPER  GASTROINTESTINAL ENDOSCOPY      There were no vitals filed for this visit.   Subjective Assessment - 04/08/21 1433     Subjective Pt. reported no pain today.  He stated that the MD indicated strengthening can occur (had script that noted that information).    Limitations House hold activities;Lifting    Patient Stated Goals Reduce pain, return to work    Currently in Pain? No/denies    Pain Score 0-No pain    Pain Onset More than a month ago                [facility] PT Assessment - 04/08/21 0001       Assessment   Medical Diagnosis S46.011D (ICD-10-CM) - Traumatic complete tear of right rotator cuff, subsequent encounter    Referring Provider (PT) Cammy Copa, MD    Onset Date/Surgical Date 02/05/21    Hand Dominance Right      Precautions   Precaution Comments Ok for strengthening unrestricted by protocol      Strength   Right Shoulder Flexion 3+/5   11.4, 11.5 lbs in sitting 90 deg flexion   Left Shoulder Flexion 5/5   45.42 lbs in sitting 90 deg flexion                          [facility] Adult PT Treatment/Exercise - 04/08/21 0001  Shoulder Exercises: Supine   Horizontal ABduction Both   2 x 10   Theraband Level (Shoulder Horizontal ABduction) Level 3 (Green)    Other Supine Exercises supine 90 deg flexion circles cw, ccw 30x 2 each way 2 lbs      Shoulder Exercises: Sidelying   External Rotation Right   c towel at side 1 lb 3 x 10   ABduction Right   3 x 10 1 lb     Shoulder Exercises: Standing   Flexion Both   2 x 15 0-90 degrees   Extension Both   3 x 10   Theraband Level (Shoulder Extension) Level 3 (Green)    Row Both   3 x 10   Theraband Level (Shoulder Row) Level 3 (Green)    Other Standing Exercises wall push up c SA press hold 2 seconds 2 x 10      Shoulder Exercises: Pulleys   Flexion 2 minutes    ABduction 2 minutes                       PT Short Term Goals - 03/13/21 1514       PT SHORT TERM GOAL #1    Title Patient will demonstrate independent use of home exercise program to maintain progress from in clinic treatments.    Time 3    Period Weeks    Status Achieved    Target Date 03/20/21               PT Long Term Goals - 03/18/21 1502       PT LONG TERM GOAL #1   Title Patient will demonstrate/report pain at worst less than or equal to 2/10 to facilitate minimal limitation in daily activity secondary to pain symptoms.    Time 10    Period Weeks    Status On-going    Target Date 05/08/21      PT LONG TERM GOAL #2   Title Patient will demonstrate independent use of home exercise program to facilitate ability to maintain/progress functional gains from skilled physical therapy services.    Time 10    Period Weeks    Status On-going    Target Date 05/08/21      PT LONG TERM GOAL #3   Title Pt. will demonstrate FOTO outcome > or = 64 % to indicated reduced disability due to condition.    Time 10    Period Weeks    Status On-going    Target Date 05/08/21      PT LONG TERM GOAL #4   Title Patient will demonstrate Rt GH joint mobility WFL to facilitate usual self care, dressing, reaching overhead at PLOF s limitation due to symptoms.    Time 10    Period Weeks    Status On-going    Target Date 05/08/21      PT LONG TERM GOAL #5   Title Patient will demonstrate Rt UE MMT 5/5, with 15 % dynamometry of Lt throughout to facilitate usual lifting, carrying in functional activity to PLOF s limitation.    Time 10    Period Weeks    Status On-going    Target Date 05/08/21      PT LONG TERM GOAL #6   Title Pt. will return to work at [employer] s limitation    Time 10    Period Weeks    Status On-going    Target Date 05/08/21  Plan - 04/08/21 1450     Clinical Impression Statement Progression allowed per MD visit note to include strengthening program in addition to progression in AROM against gravity.  Fair to good tolerance overall, a few times of  overload noted in elevation/ER strengthening. Continued skilled PT services indicated at this time to promote strength.    Examination-Activity Limitations Reach Overhead;Lift;Hygiene/Grooming;Dressing;Carry;Sleep;Bed Mobility;Bathing    Examination-Participation Restrictions Occupation;Meal Prep;Community Activity;Cleaning;Interpersonal Relationship;Yard Work    Stability/Clinical Decision Making Stable/Uncomplicated    Rehab Potential Good    PT Frequency Other (comment)   2-3x/week   PT Duration Other (comment)   10 weeks   PT Treatment/Interventions ADLs/Self Care Home Management;Cryotherapy;Electrical Stimulation;Iontophoresis 4mg /ml Dexamethasone;Moist Heat;Balance training;Therapeutic exercise;Therapeutic activities;Functional mobility training;Stair training;Gait training;DME Instruction;Ultrasound;Neuromuscular re-education;Patient/family education;Passive range of motion;Spinal Manipulations;Joint Manipulations;Dry needling;Taping;Vasopneumatic Device;Manual techniques    PT Next Visit Plan Progressive strengthening for RTC and periscapular musculature.    PT Home Exercise Plan [id number]    Consulted and Agree with Plan of Care Patient             Patient will benefit from skilled therapeutic intervention in order to improve the following deficits and impairments:  Decreased endurance, Hypomobility, Decreased activity tolerance, Decreased strength, Impaired UE functional use, Pain, Decreased mobility, Decreased range of motion, Impaired perceived functional ability, Improper body mechanics, Impaired flexibility, Decreased coordination, Increased edema  Visit Diagnosis: Chronic right shoulder pain  Muscle weakness (generalized)  Stiffness of right shoulder, not elsewhere classified  Localized edema     Problem List Patient Active Problem List   Diagnosis Date Noted   Unilateral conductive hearing loss 02/26/2021   Pre-syncope 02/26/2021   Complete tear of right rotator  cuff    Biceps tendonitis on right    Degenerative superior labral anterior-to-posterior (SLAP) tear of right shoulder    Acute pain of right shoulder 01/11/2020   Blood pressure check 10/20/2019   Mood changes 04/12/2019   Acute midline low back pain without sciatica 02/13/2019   Erectile dysfunction 07/23/2018   Ganglion cyst of dorsum of left wrist 04/20/2018   Prediabetes 04/17/2016   OBESITY, NOS 04/16/2006    04/18/2006, PT, DPT, OCS, ATC 04/08/21  3:03 PM    [facility] [address] [place], [place], [place] Phone: [phone]   Fax:  [phone]  Name: Tony Nguyen MRN: Annye Rusk Date of Birth: 03-30-1960

## 2021-04-10 ENCOUNTER — Ambulatory Visit (INDEPENDENT_AMBULATORY_CARE_PROVIDER_SITE_OTHER): Payer: No Typology Code available for payment source | Admitting: Rehabilitative and Restorative Service Providers"

## 2021-04-10 ENCOUNTER — Other Ambulatory Visit: Payer: Self-pay

## 2021-04-10 ENCOUNTER — Encounter: Payer: Self-pay | Admitting: Rehabilitative and Restorative Service Providers"

## 2021-04-10 DIAGNOSIS — M25611 Stiffness of right shoulder, not elsewhere classified: Secondary | ICD-10-CM | POA: Diagnosis not present

## 2021-04-10 DIAGNOSIS — R600 Localized edema: Secondary | ICD-10-CM | POA: Diagnosis not present

## 2021-04-10 DIAGNOSIS — M6281 Muscle weakness (generalized): Secondary | ICD-10-CM | POA: Diagnosis not present

## 2021-04-10 DIAGNOSIS — G8929 Other chronic pain: Secondary | ICD-10-CM

## 2021-04-10 DIAGNOSIS — M25511 Pain in right shoulder: Secondary | ICD-10-CM | POA: Diagnosis not present

## 2021-04-10 NOTE — Therapy (Signed)
[facility] [address] [place], [place], [address] Phone: [phone]   Fax:  [phone]  Physical Therapy Treatment  Patient Details  Name: Tony Nguyen MRN: [id number] Date of Birth: 1960-11-19 Referring Provider (PT): August Saucer Corrie Mckusick, MD   Encounter Date: 04/10/2021   PT End of Session - 04/10/21 1421     Visit Number 11    Number of Visits 20    Date for PT Re-Evaluation 05/08/21    Authorization Type [employer]    PT Start Time 1420    PT Stop Time 1459    PT Time Calculation (min) 39 min    Activity Tolerance Patient tolerated treatment well    Behavior During Therapy [facility] for tasks assessed/performed             Past Medical History:  Diagnosis Date   Bug bite 08/09/2018   Duodenal ulcer due to Helicobacter pylori 09/25/2010   EGD confirmed that duodenal ulcers and H. pylori gastritis in 08/2009. Treated with triple  therapy at [facility].    Gallbladder polyp 09/25/2010   GERD (gastroesophageal reflux disease)    Major depression in remission (HCC)    OTHER DISEASES OF NASAL CAVITY AND SINUSES 02/23/2008   Qualifier: Diagnosis of  By: Daphine Deutscher MD, [facility]     RHINITIS, CHRONIC 04/16/2006   Qualifier: Diagnosis of  By: Bebe Shaggy     Screening for prostate cancer 08/12/2019   TENNIS ELBOW 04/16/2006   Qualifier: Diagnosis of  By: Bebe Shaggy     Tinea corporis 06/01/2017    Past Surgical History:  Procedure Laterality Date   APPENDECTOMY  1981   CHOLECYSTECTOMY  10/15/10   Path results revealed chronic cholecystitis   ELBOW SURGERY  2005   right   SHOULDER ARTHROSCOPY WITH SUBACROMIAL DECOMPRESSION, ROTATOR CUFF REPAIR AND BICEP TENDON REPAIR Right 02/05/2021   Procedure: RIGHT SHOULDER ARTHROSCOPY, SUBACROMIAL DECOMPRESSION, DEBRIDEMENT, MINI OPEN ROTATOR CUFF TEAR REPAIR, BICEPS TENODESIS;  Surgeon: Cammy Copa, MD;  Location: [facility] OR;  Service: Orthopedics;  Laterality: Right;   UPPER  GASTROINTESTINAL ENDOSCOPY      There were no vitals filed for this visit.   Subjective Assessment - 04/10/21 1424     Subjective Pt. indicated he didn't have any increased soreness from performance of resistance interventions.  No pain upon arrival reported today.  Pt. indicatd he fell yesterday but not on arm.    Limitations House hold activities;Lifting    Patient Stated Goals Reduce pain, return to work    Currently in Pain? No/denies    Pain Score 0-No pain    Pain Onset More than a month ago                               [facility] Adult PT Treatment/Exercise - 04/10/21 0001       Shoulder Exercises: Supine   Horizontal ABduction Both   2 x 15   Theraband Level (Shoulder Horizontal ABduction) Level 3 (Green)    Other Supine Exercises supine d2 extension green band 2 x 10      Shoulder Exercises: Prone   Other Prone Exercises attempted prone y, t (unable due to pain0      Shoulder Exercises: Sidelying   ABduction Right   2 x 15   ABduction Weight (lbs) 1      Shoulder Exercises: Standing   External Rotation Right   c towel at side 3 x  10   Theraband Level (Shoulder External Rotation) Level 3 (Green)    Internal Rotation Right   3 x 10 c towel at side   Theraband Level (Shoulder Internal Rotation) Level 3 (Green)    Row 20 reps;Both    Theraband Level (Shoulder Row) Level 4 (Blue)    Other Standing Exercises standing wall scaption slide c lift off hold x 15      Shoulder Exercises: ROM/Strengthening   UBE (Upper Arm Bike) Lvl 3.5 3 mins fwd/back each way      Shoulder Exercises: Stretch   Other Shoulder Stretches supine cross arm stretch 15 sec x 5 Rt      Manual Therapy   Manual therapy comments g4 inferior jt mobs in scaption and abduction Rt shoulder.  Posterior glides g3 in various degress of abduction and in ER Rt shoulder                       PT Short Term Goals - 03/13/21 1514       PT SHORT TERM GOAL #1   Title Patient  will demonstrate independent use of home exercise program to maintain progress from in clinic treatments.    Time 3    Period Weeks    Status Achieved    Target Date 03/20/21               PT Long Term Goals - 03/18/21 1502       PT LONG TERM GOAL #1   Title Patient will demonstrate/report pain at worst less than or equal to 2/10 to facilitate minimal limitation in daily activity secondary to pain symptoms.    Time 10    Period Weeks    Status On-going    Target Date 05/08/21      PT LONG TERM GOAL #2   Title Patient will demonstrate independent use of home exercise program to facilitate ability to maintain/progress functional gains from skilled physical therapy services.    Time 10    Period Weeks    Status On-going    Target Date 05/08/21      PT LONG TERM GOAL #3   Title Pt. will demonstrate FOTO outcome > or = 64 % to indicated reduced disability due to condition.    Time 10    Period Weeks    Status On-going    Target Date 05/08/21      PT LONG TERM GOAL #4   Title Patient will demonstrate Rt [place] joint mobility WFL to facilitate usual self care, dressing, reaching overhead at PLOF s limitation due to symptoms.    Time 10    Period Weeks    Status On-going    Target Date 05/08/21      PT LONG TERM GOAL #5   Title Patient will demonstrate Rt UE MMT 5/5, with 15 % dynamometry of Lt throughout to facilitate usual lifting, carrying in functional activity to PLOF s limitation.    Time 10    Period Weeks    Status On-going    Target Date 05/08/21      PT LONG TERM GOAL #6   Title Pt. will return to work at [employer] s limitation    Time 10    Period Weeks    Status On-going    Target Date 05/08/21                   Plan - 04/10/21 1442     Clinical  Impression Statement Pt. presented today with increased general tightness in most directions compared to previous visits.  Spent time in manual and stretching to promote improved mobility for functional  strengthening.  Fair to good overall tolerance to intervention today. Continued improvements necessary for return to PLOF s restriction.    Examination-Activity Limitations Reach Overhead;Lift;Hygiene/Grooming;Dressing;Carry;Sleep;Bed Mobility;Bathing    Examination-Participation Restrictions Occupation;Meal Prep;Community Activity;Cleaning;Interpersonal Relationship;Yard Work    Stability/Clinical Decision Making Stable/Uncomplicated    Rehab Potential Good    PT Frequency Other (comment)   2-3x/week   PT Duration Other (comment)   10 weeks   PT Treatment/Interventions ADLs/Self Care Home Management;Cryotherapy;Electrical Stimulation;Iontophoresis 4mg /ml Dexamethasone;Moist Heat;Balance training;Therapeutic exercise;Therapeutic activities;Functional mobility training;Stair training;Gait training;DME Instruction;Ultrasound;Neuromuscular re-education;Patient/family education;Passive range of motion;Spinal Manipulations;Joint Manipulations;Dry needling;Taping;Vasopneumatic Device;Manual techniques    PT Next Visit Plan Recheck mobility tightness from today, progressive strengthening in flexion, scaption, abduction, er.    PT Home Exercise Plan [id number]    Consulted and Agree with Plan of Care Patient             Patient will benefit from skilled therapeutic intervention in order to improve the following deficits and impairments:  Decreased endurance, Hypomobility, Decreased activity tolerance, Decreased strength, Impaired UE functional use, Pain, Decreased mobility, Decreased range of motion, Impaired perceived functional ability, Improper body mechanics, Impaired flexibility, Decreased coordination, Increased edema  Visit Diagnosis: Chronic right shoulder pain  Muscle weakness (generalized)  Stiffness of right shoulder, not elsewhere classified  Localized edema     Problem List Patient Active Problem List   Diagnosis Date Noted   Unilateral conductive hearing loss 02/26/2021    Pre-syncope 02/26/2021   Complete tear of right rotator cuff    Biceps tendonitis on right    Degenerative superior 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

## 2021-04-15 ENCOUNTER — Ambulatory Visit (INDEPENDENT_AMBULATORY_CARE_PROVIDER_SITE_OTHER): Payer: No Typology Code available for payment source | Admitting: Rehabilitative and Restorative Service Providers"

## 2021-04-15 ENCOUNTER — Other Ambulatory Visit: Payer: Self-pay

## 2021-04-15 ENCOUNTER — Encounter: Payer: Self-pay | Admitting: Rehabilitative and Restorative Service Providers"

## 2021-04-15 DIAGNOSIS — M6281 Muscle weakness (generalized): Secondary | ICD-10-CM | POA: Diagnosis not present

## 2021-04-15 DIAGNOSIS — M25511 Pain in right shoulder: Secondary | ICD-10-CM

## 2021-04-15 DIAGNOSIS — G8929 Other chronic pain: Secondary | ICD-10-CM

## 2021-04-15 DIAGNOSIS — M25611 Stiffness of right shoulder, not elsewhere classified: Secondary | ICD-10-CM

## 2021-04-15 DIAGNOSIS — R600 Localized edema: Secondary | ICD-10-CM | POA: Diagnosis not present

## 2021-04-15 NOTE — Therapy (Signed)
[facility] Physical Therapy [address] [place], [place], [address] Phone: [phone]   Fax:  [phone]  Physical Therapy Treatment  Patient Details  Name: Tony Nguyen MRN: [id number] Date of Birth: 1960/05/19 Referring Provider (PT): August Saucer Corrie Mckusick, MD   Encounter Date: 04/15/2021   PT End of Session - 04/15/21 1434     Visit Number 12    Number of Visits 20    Date for PT Re-Evaluation 05/08/21    Authorization Type [employer]    PT Start Time 1426    PT Stop Time 1505    PT Time Calculation (min) 39 min    Activity Tolerance Patient limited by pain    Behavior During Therapy [facility] for tasks assessed/performed             Past Medical History:  Diagnosis Date   Bug bite 08/09/2018   Duodenal ulcer due to Helicobacter pylori 09/25/2010   EGD confirmed that duodenal ulcers and H. pylori gastritis in 08/2009. Treated with triple  therapy at [facility].    Gallbladder polyp 09/25/2010   GERD (gastroesophageal reflux disease)    Major depression in remission (HCC)    OTHER DISEASES OF NASAL CAVITY AND SINUSES 02/23/2008   Qualifier: Diagnosis of  By: Daphine Deutscher MD, [facility]     RHINITIS, CHRONIC 04/16/2006   Qualifier: Diagnosis of  By: Bebe Shaggy     Screening for prostate cancer 08/12/2019   TENNIS ELBOW 04/16/2006   Qualifier: Diagnosis of  By: Bebe Shaggy     Tinea corporis 06/01/2017    Past Surgical History:  Procedure Laterality Date   APPENDECTOMY  1981   CHOLECYSTECTOMY  10/15/10   Path results revealed chronic cholecystitis   ELBOW SURGERY  2005   right   SHOULDER ARTHROSCOPY WITH SUBACROMIAL DECOMPRESSION, ROTATOR CUFF REPAIR AND BICEP TENDON REPAIR Right 02/05/2021   Procedure: RIGHT SHOULDER ARTHROSCOPY, SUBACROMIAL DECOMPRESSION, DEBRIDEMENT, MINI OPEN ROTATOR CUFF TEAR REPAIR, BICEPS TENODESIS;  Surgeon: Cammy Copa, MD;  Location: [facility] OR;  Service: Orthopedics;  Laterality: Right;   UPPER  GASTROINTESTINAL ENDOSCOPY      There were no vitals filed for this visit.   Subjective Assessment - 04/15/21 1433     Subjective Pt. indicated having insidious onset of soreness/pain over the weekend that was fairly constant.  Rated symptoms 7/10 and not worse with HEP.  Pt. indicated it was similar from both shoulders.    Limitations House hold activities;Lifting    Patient Stated Goals Reduce pain, return to work    Currently in Pain? Yes    Pain Score 7     Pain Location Shoulder    Pain Orientation Left;Right;Posterior    Pain Descriptors / Indicators Aching;Sore    Pain Type Chronic pain    Pain Onset More than a month ago    Pain Frequency Constant    Aggravating Factors  insidious, maybe sleeping on it    Pain Relieving Factors nothing specific other than OTC medicine                [facility] PT Assessment - 04/15/21 0001       Assessment   Medical Diagnosis S46.011D (ICD-10-CM) - Traumatic complete tear of right rotator cuff, subsequent encounter    Referring Provider (PT) August Saucer Corrie Mckusick, MD    Onset Date/Surgical Date 02/05/21    Hand Dominance Right      AROM   Right Shoulder Internal Rotation 50 Degrees   in 45 deg abduction  supine c pain noted                          [facility] Adult PT Treatment/Exercise - 04/15/21 0001       Shoulder Exercises: Standing   External Rotation Right   3 x 10 slow eccentrics only c Rt arm   Theraband Level (Shoulder External Rotation) Level 3 (Green)    Other Standing Exercises standing ER hold in neutral 15 sec hold x 5      Shoulder Exercises: Stretch   Other Shoulder Stretches supine cross arm stretch 15 sec x 5 Rt    Other Shoulder Stretches standing wand ER up back 5 sec hold x 10 bilateral      Manual Therapy   Manual therapy comments percussive device STM to Rt infraspinatus, supraspinatus.  Compression c movement to Rt infraspinatus.  Mobilization c movement for ER/IR c posterior glide Rt GH joint.   Contract/relax for IR mobility in 45 deg abduction                       PT Short Term Goals - 03/13/21 1514       PT SHORT TERM GOAL #1   Title Patient will demonstrate independent use of home exercise program to maintain progress from in clinic treatments.    Time 3    Period Weeks    Status Achieved    Target Date 03/20/21               PT Long Term Goals - 03/18/21 1502       PT LONG TERM GOAL #1   Title Patient will demonstrate/report pain at worst less than or equal to 2/10 to facilitate minimal limitation in daily activity secondary to pain symptoms.    Time 10    Period Weeks    Status On-going    Target Date 05/08/21      PT LONG TERM GOAL #2   Title Patient will demonstrate independent use of home exercise program to facilitate ability to maintain/progress functional gains from skilled physical therapy services.    Time 10    Period Weeks    Status On-going    Target Date 05/08/21      PT LONG TERM GOAL #3   Title Pt. will demonstrate FOTO outcome > or = 64 % to indicated reduced disability due to condition.    Time 10    Period Weeks    Status On-going    Target Date 05/08/21      PT LONG TERM GOAL #4   Title Patient will demonstrate Rt GH joint mobility WFL to facilitate usual self care, dressing, reaching overhead at PLOF s limitation due to symptoms.    Time 10    Period Weeks    Status On-going    Target Date 05/08/21      PT LONG TERM GOAL #5   Title Patient will demonstrate Rt UE MMT 5/5, with 15 % dynamometry of Lt throughout to facilitate usual lifting, carrying in functional activity to PLOF s limitation.    Time 10    Period Weeks    Status On-going    Target Date 05/08/21      PT LONG TERM GOAL #6   Title Pt. will return to work at [employer] s limitation    Time 10    Period Weeks    Status On-going    Target Date 05/08/21  Plan - 04/15/21 1449     Clinical Impression Statement Increased  muscular guarding and symptoms noted in posterior capsule stretching, noted in IR/HBB AROM/PROM today for Rt shoulder.  Spent additional time today in manual intervention to address symptoms and mobility restrictions to promote gains.    Examination-Activity Limitations Reach Overhead;Lift;Hygiene/Grooming;Dressing;Carry;Sleep;Bed Mobility;Bathing    Examination-Participation Restrictions Occupation;Meal Prep;Community Activity;Cleaning;Interpersonal Relationship;Yard Work    Stability/Clinical Decision Making Stable/Uncomplicated    Rehab Potential Good    PT Frequency Other (comment)   2-3x/week   PT Duration Other (comment)   10 weeks   PT Treatment/Interventions ADLs/Self Care Home Management;Cryotherapy;Electrical Stimulation;Iontophoresis 4mg /ml Dexamethasone;Moist Heat;Balance training;Therapeutic exercise;Therapeutic activities;Functional mobility training;Stair training;Gait training;DME Instruction;Ultrasound;Neuromuscular re-education;Patient/family education;Passive range of motion;Spinal Manipulations;Joint Manipulations;Dry needling;Taping;Vasopneumatic Device;Manual techniques    PT Next Visit Plan Reassess IR mobility response from today.  Progressive strengthening as able.    PT Home Exercise Plan [id number]    Consulted and Agree with Plan of Care Patient             Patient will benefit from skilled therapeutic intervention in order to improve the following deficits and impairments:  Decreased endurance, Hypomobility, Decreased activity tolerance, Decreased strength, Impaired UE functional use, Pain, Decreased mobility, Decreased range of motion, Impaired perceived functional ability, Improper body mechanics, Impaired flexibility, Decreased coordination, Increased edema  Visit Diagnosis: Chronic right shoulder pain  Muscle weakness (generalized)  Stiffness of right shoulder, not elsewhere classified  Localized edema     Problem List Patient Active Problem List    Diagnosis Date Noted   Unilateral conductive hearing loss 02/26/2021   Pre-syncope 02/26/2021   Complete tear of right rotator cuff    Biceps tendonitis on right    Degenerative superior labral anterior-to-posterior (SLAP) tear of right shoulder    Acute pain of right shoulder 01/11/2020   Blood pressure check 10/20/2019   Mood changes 04/12/2019   Acute midline low back pain without sciatica 02/13/2019   Erectile dysfunction 07/23/2018   Ganglion cyst of dorsum of left wrist 04/20/2018   Prediabetes 04/17/2016   OBESITY, NOS 04/16/2006    Chyrel Masson, PT, DPT, OCS, ATC 04/15/21  3:06 PM    [facility] Physical Therapy [address] [place], [place], [address] Phone: [phone]   Fax:  [phone]  Name: OBSIDIAN NGUYENTHI MRN: [id number] Date of Birth: 25-May-1960

## 2021-04-23 ENCOUNTER — Encounter: Payer: Self-pay | Admitting: Rehabilitative and Restorative Service Providers"

## 2021-04-23 ENCOUNTER — Ambulatory Visit (INDEPENDENT_AMBULATORY_CARE_PROVIDER_SITE_OTHER): Payer: No Typology Code available for payment source | Admitting: Rehabilitative and Restorative Service Providers"

## 2021-04-23 ENCOUNTER — Other Ambulatory Visit: Payer: Self-pay

## 2021-04-23 DIAGNOSIS — M6281 Muscle weakness (generalized): Secondary | ICD-10-CM | POA: Diagnosis not present

## 2021-04-23 DIAGNOSIS — G8929 Other chronic pain: Secondary | ICD-10-CM

## 2021-04-23 DIAGNOSIS — M25511 Pain in right shoulder: Secondary | ICD-10-CM | POA: Diagnosis not present

## 2021-04-23 DIAGNOSIS — R600 Localized edema: Secondary | ICD-10-CM

## 2021-04-23 DIAGNOSIS — M25611 Stiffness of right shoulder, not elsewhere classified: Secondary | ICD-10-CM | POA: Diagnosis not present

## 2021-04-23 NOTE — Therapy (Signed)
[facility] Physical Therapy [address] [place], [place], [address] Phone: [phone]   Fax:  [phone]  Physical Therapy Treatment  Patient Details  Name: Tony Nguyen MRN: [id number] Date of Birth: 1960-07-21 Referring Provider (PT): August Saucer Corrie Mckusick, MD   Encounter Date: 04/23/2021   PT End of Session - 04/23/21 1427     Visit Number 13    Number of Visits 20    Date for PT Re-Evaluation 05/08/21    Authorization Type [employer]    PT Start Time 1428    PT Stop Time 1507    PT Time Calculation (min) 39 min    Activity Tolerance Patient tolerated treatment well    Behavior During Therapy [facility] for tasks assessed/performed             Past Medical History:  Diagnosis Date   Bug bite 08/09/2018   Duodenal ulcer due to Helicobacter pylori 09/25/2010   EGD confirmed that duodenal ulcers and H. pylori gastritis in 08/2009. Treated with triple  therapy at [facility].    Gallbladder polyp 09/25/2010   GERD (gastroesophageal reflux disease)    Major depression in remission (HCC)    OTHER DISEASES OF NASAL CAVITY AND SINUSES 02/23/2008   Qualifier: Diagnosis of  By: Daphine Deutscher MD, [facility]     RHINITIS, CHRONIC 04/16/2006   Qualifier: Diagnosis of  By: Bebe Shaggy     Screening for prostate cancer 08/12/2019   TENNIS ELBOW 04/16/2006   Qualifier: Diagnosis of  By: Bebe Shaggy     Tinea corporis 06/01/2017    Past Surgical History:  Procedure Laterality Date   APPENDECTOMY  1981   CHOLECYSTECTOMY  10/15/10   Path results revealed chronic cholecystitis   ELBOW SURGERY  2005   right   SHOULDER ARTHROSCOPY WITH SUBACROMIAL DECOMPRESSION, ROTATOR CUFF REPAIR AND BICEP TENDON REPAIR Right 02/05/2021   Procedure: RIGHT SHOULDER ARTHROSCOPY, SUBACROMIAL DECOMPRESSION, DEBRIDEMENT, MINI OPEN ROTATOR CUFF TEAR REPAIR, BICEPS TENODESIS;  Surgeon: Cammy Copa, MD;  Location: [facility] OR;  Service: Orthopedics;  Laterality: Right;   UPPER  GASTROINTESTINAL ENDOSCOPY      There were no vitals filed for this visit.   Subjective Assessment - 04/23/21 1434     Subjective Pt. indicated progressively improving since last visit.  Still had complaints of mild pain in front of Rt shoulder.    Limitations House hold activities;Lifting    Patient Stated Goals Reduce pain, return to work    Currently in Pain? Yes    Pain Score --   reported as mild   Pain Location Shoulder    Pain Orientation Anterior;Right    Pain Descriptors / Indicators Aching;Sore    Pain Type Chronic pain    Pain Onset More than a month ago    Pain Frequency Constant    Aggravating Factors  insidious, sometimes c movement    Pain Relieving Factors last treatment manual helped.                [facility] PT Assessment - 04/23/21 0001       Assessment   Medical Diagnosis S46.011D (ICD-10-CM) - Traumatic complete tear of right rotator cuff, subsequent encounter    Referring Provider (PT) August Saucer Corrie Mckusick, MD    Onset Date/Surgical Date 02/05/21    Hand Dominance Right      Strength   Right Shoulder Flexion 4/5   14.5 lbs     Palpation   Palpation comment Trigger points in Rt infraspinatus, supraspinatus  [facility] Adult PT Treatment/Exercise - 04/23/21 0001       Shoulder Exercises: Standing   External Rotation Right   3 x 10 c towel at side eccentric only   Theraband Level (Shoulder External Rotation) Level 3 (Green)    Flexion Both   2 x 15 2 lb , 0-100 degrees   Shoulder Flexion Weight (lbs) 2    Extension 20 reps;Both    Theraband Level (Shoulder Extension) Level 4 (Blue)    Row 20 reps;Both    Theraband Level (Shoulder Row) Level 4 (Blue)      Shoulder Exercises: ROM/Strengthening   UBE (Upper Arm Bike) Lvl 3.5 3 mins fwd/back each way      Shoulder Exercises: Stretch   Other Shoulder Stretches supine cross arm stretch 15 sec x 5 Rt      Manual Therapy   Manual therapy comments percussive  device STM to Rt infraspinatus              Trigger Point Dry Needling - 04/23/21 0001     Consent Given? Yes    Education Handout Provided Yes    Muscles Treated Upper Quadrant Infraspinatus   Rt   Infraspinatus Response Twitch response elicited                   PT Education - 04/23/21 1454     Education Details DN education c handout provided    Person(s) Educated Patient    Methods Explanation;Demonstration;Verbal cues;Handout    Comprehension Verbalized understanding;Returned demonstration              PT Short Term Goals - 03/13/21 1514       PT SHORT TERM GOAL #1   Title Patient will demonstrate independent use of home exercise program to maintain progress from in clinic treatments.    Time 3    Period Weeks    Status Achieved    Target Date 03/20/21               PT Long Term Goals - 04/23/21 1455       PT LONG TERM GOAL #1   Title Patient will demonstrate/report pain at worst less than or equal to 2/10 to facilitate minimal limitation in daily activity secondary to pain symptoms.    Time 10    Period Weeks    Status On-going    Target Date 05/08/21      PT LONG TERM GOAL #2   Title Patient will demonstrate independent use of home exercise program to facilitate ability to maintain/progress functional gains from skilled physical therapy services.    Time 10    Period Weeks    Status On-going    Target Date 05/08/21      PT LONG TERM GOAL #3   Title Pt. will demonstrate FOTO outcome > or = 64 % to indicated reduced disability due to condition.    Time 10    Period Weeks    Status On-going    Target Date 05/08/21      PT LONG TERM GOAL #4   Title Patient will demonstrate Rt GH joint mobility WFL to facilitate usual self care, dressing, reaching overhead at PLOF s limitation due to symptoms.    Time 10    Period Weeks    Status On-going    Target Date 05/08/21      PT LONG TERM GOAL #5   Title Patient will demonstrate Rt UE  MMT 5/5, with 15 %  dynamometry of Lt throughout to facilitate usual lifting, carrying in functional activity to PLOF s limitation.    Time 10    Period Weeks    Status On-going    Target Date 05/08/21      PT LONG TERM GOAL #6   Title Pt. will return to work at [employer] s limitation    Time 10    Period Weeks    Status On-going    Target Date 05/08/21                   Plan - 04/23/21 1453     Clinical Impression Statement Continued complaints related to infraspinatus and supraspinatus trigger points c anterior shoulder pain reported.  Improvement in anterior shoulder symptoms c movement today following manual and DN intervention.  Fair tolerance to use of DN.  Continued focus on improving overall strength and control to facilitate progression towards goals.    Examination-Activity Limitations Reach Overhead;Lift;Hygiene/Grooming;Dressing;Carry;Sleep;Bed Mobility;Bathing    Examination-Participation Restrictions Occupation;Meal Prep;Community Activity;Cleaning;Interpersonal Relationship;Yard Work    Stability/Clinical Decision Making Stable/Uncomplicated    Rehab Potential Good    PT Frequency Other (comment)   2-3x/week   PT Duration Other (comment)   10 weeks   PT Treatment/Interventions ADLs/Self Care Home Management;Cryotherapy;Electrical Stimulation;Iontophoresis 4mg /ml Dexamethasone;Moist Heat;Balance training;Therapeutic exercise;Therapeutic activities;Functional mobility training;Stair training;Gait training;DME Instruction;Ultrasound;Neuromuscular re-education;Patient/family education;Passive range of motion;Spinal Manipulations;Joint Manipulations;Dry needling;Taping;Vasopneumatic Device;Manual techniques    PT Next Visit Plan DN if desired.  Continued trigger point release techniques and progressive strengthening.    PT Home Exercise Plan [id number]    Consulted and Agree with Plan of Care Patient             Patient will benefit from skilled therapeutic intervention  in order to improve the following deficits and impairments:  Decreased endurance, Hypomobility, Decreased activity tolerance, Decreased strength, Impaired UE functional use, Pain, Decreased mobility, Decreased range of motion, Impaired perceived functional ability, Improper body mechanics, Impaired flexibility, Decreased coordination, Increased edema  Visit Diagnosis: Chronic right shoulder pain  Muscle weakness (generalized)  Stiffness of right shoulder, not elsewhere classified  Localized edema     Problem List Patient Active Problem List   Diagnosis Date Noted   Unilateral conductive hearing loss 02/26/2021   Pre-syncope 02/26/2021   Complete tear of right rotator cuff    Biceps tendonitis on right    Degenerative superior labral anterior-to-posterior (SLAP) tear of right shoulder    Acute pain of right shoulder 01/11/2020   Blood pressure check 10/20/2019   Mood changes 04/12/2019   Acute midline low back pain without sciatica 02/13/2019   Erectile dysfunction 07/23/2018   Ganglion cyst of dorsum of left wrist 04/20/2018   Prediabetes 04/17/2016   OBESITY, NOS 04/16/2006    Chyrel Masson, PT, DPT, OCS, ATC 04/23/21  3:00 PM    [facility] Physical Therapy [address] [place], [place], [address] Phone: [phone]   Fax:  [phone]  Name: COSTAS VANNICE MRN: [id number] Date of Birth: [birth year]

## 2021-04-25 ENCOUNTER — Encounter: Payer: Self-pay | Admitting: Rehabilitative and Restorative Service Providers"

## 2021-04-25 ENCOUNTER — Other Ambulatory Visit: Payer: Self-pay

## 2021-04-25 ENCOUNTER — Ambulatory Visit (INDEPENDENT_AMBULATORY_CARE_PROVIDER_SITE_OTHER): Payer: No Typology Code available for payment source | Admitting: Rehabilitative and Restorative Service Providers"

## 2021-04-25 DIAGNOSIS — M6281 Muscle weakness (generalized): Secondary | ICD-10-CM

## 2021-04-25 DIAGNOSIS — M25611 Stiffness of right shoulder, not elsewhere classified: Secondary | ICD-10-CM

## 2021-04-25 DIAGNOSIS — M25511 Pain in right shoulder: Secondary | ICD-10-CM

## 2021-04-25 DIAGNOSIS — R600 Localized edema: Secondary | ICD-10-CM

## 2021-04-25 DIAGNOSIS — G8929 Other chronic pain: Secondary | ICD-10-CM

## 2021-04-25 NOTE — Therapy (Signed)
[facility] Therapy [address] [place], [place], [address] Phone: [phone]   Fax:  [phone]  Physical Therapy Treatment  Patient Details  Name: Tony Nguyen MRN: [id number] Date of Birth: 1960/10/23 Referring Provider (PT): August Saucer Corrie Mckusick, MD   Encounter Date: 04/25/2021   PT End of Session - 04/25/21 1419     Visit Number 14    Number of Visits 20    Date for PT Re-Evaluation 05/08/21    Authorization Type [employer]    PT Start Time 1424    PT Stop Time 1504    PT Time Calculation (min) 40 min    Activity Tolerance Patient tolerated treatment well    Behavior During Therapy [facility] for tasks assessed/performed             Past Medical History:  Diagnosis Date   Bug bite 08/09/2018   Duodenal ulcer due to Helicobacter pylori 09/25/2010   EGD confirmed that duodenal ulcers and H. pylori gastritis in 08/2009. Treated with triple  therapy at [facility].    Gallbladder polyp 09/25/2010   GERD (gastroesophageal reflux disease)    Major depression in remission (HCC)    OTHER DISEASES OF NASAL CAVITY AND SINUSES 02/23/2008   Qualifier: Diagnosis of  By: Daphine Deutscher MD, [facility]     RHINITIS, CHRONIC 04/16/2006   Qualifier: Diagnosis of  By: Bebe Shaggy     Screening for prostate cancer 08/12/2019   TENNIS ELBOW 04/16/2006   Qualifier: Diagnosis of  By: Bebe Shaggy     Tinea corporis 06/01/2017    Past Surgical History:  Procedure Laterality Date   APPENDECTOMY  1981   CHOLECYSTECTOMY  10/15/10   Path results revealed chronic cholecystitis   ELBOW SURGERY  2005   right   SHOULDER ARTHROSCOPY WITH SUBACROMIAL DECOMPRESSION, ROTATOR CUFF REPAIR AND BICEP TENDON REPAIR Right 02/05/2021   Procedure: RIGHT SHOULDER ARTHROSCOPY, SUBACROMIAL DECOMPRESSION, DEBRIDEMENT, MINI OPEN ROTATOR CUFF TEAR REPAIR, BICEPS TENODESIS;  Surgeon: Cammy Copa, MD;  Location: [facility] OR;  Service: Orthopedics;  Laterality: Right;   UPPER  GASTROINTESTINAL ENDOSCOPY      There were no vitals filed for this visit.   Subjective Assessment - 04/25/21 1446     Subjective Pt. indicated 6/10 pain in shoulder when reaching behind back.  Soreness in back of shoulder was still noted.    Limitations House hold activities;Lifting    Patient Stated Goals Reduce pain, return to work    Currently in Pain? Yes    Pain Score 6     Pain Location Shoulder    Pain Orientation Right;Posterior;Anterior    Pain Descriptors / Indicators Aching;Sore    Pain Type Chronic pain    Pain Onset More than a month ago    Pain Frequency Intermittent    Aggravating Factors  reaching behind back    Pain Relieving Factors not reaching behind back.                               [facility] Adult PT Treatment/Exercise - 04/25/21 0001       Shoulder Exercises: Standing   External Rotation Right   3 x 10 c towel at side eccentric only   Theraband Level (Shoulder External Rotation) Level 3 (Green)    Other Standing Exercises wall push up c SA press x20      Shoulder Exercises: Pulleys   Flexion 2 minutes    ABduction 2 minutes  Shoulder Exercises: Stretch   Other Shoulder Stretches supine IR low load long duration stretch in 75 deg abduction - 2 lbs 3 mins    Other Shoulder Stretches Sleeper IR stretch Rt shoulder 30 sec x 5 in sidelying      Manual Therapy   Manual therapy comments Percussive device to Rt infraspinatus, contract/relax for IR mobility gains, progressive overstretch to tolerance in IR.                     PT Education - 04/25/21 1501     Education Details HEP progression for posterior capsule stretch    Person(s) Educated Patient    Methods Explanation;Demonstration;Verbal cues;Handout    Comprehension Verbalized understanding;Returned demonstration              PT Short Term Goals - 03/13/21 1514       PT SHORT TERM GOAL #1   Title Patient will demonstrate independent use of home  exercise program to maintain progress from in clinic treatments.    Time 3    Period Weeks    Status Achieved    Target Date 03/20/21               PT Long Term Goals - 04/23/21 1455       PT LONG TERM GOAL #1   Title Patient will demonstrate/report pain at worst less than or equal to 2/10 to facilitate minimal limitation in daily activity secondary to pain symptoms.    Time 10    Period Weeks    Status On-going    Target Date 05/08/21      PT LONG TERM GOAL #2   Title Patient will demonstrate independent use of home exercise program to facilitate ability to maintain/progress functional gains from skilled physical therapy services.    Time 10    Period Weeks    Status On-going    Target Date 05/08/21      PT LONG TERM GOAL #3   Title Pt. will demonstrate FOTO outcome > or = 64 % to indicated reduced disability due to condition.    Time 10    Period Weeks    Status On-going    Target Date 05/08/21      PT LONG TERM GOAL #4   Title Patient will demonstrate Rt GH joint mobility WFL to facilitate usual self care, dressing, reaching overhead at PLOF s limitation due to symptoms.    Time 10    Period Weeks    Status On-going    Target Date 05/08/21      PT LONG TERM GOAL #5   Title Patient will demonstrate Rt UE MMT 5/5, with 15 % dynamometry of Lt throughout to facilitate usual lifting, carrying in functional activity to PLOF s limitation.    Time 10    Period Weeks    Status On-going    Target Date 05/08/21      PT LONG TERM GOAL #6   Title Pt. will return to work at [employer] s limitation    Time 10    Period Weeks    Status On-going    Target Date 05/08/21                   Plan - 04/25/21 1445     Clinical Impression Statement Posterior capsule and rotation mobility continued to be stiffer and restrictive today and IR /HBB movement was chief complaint for pain.    Examination-Activity Limitations Reach  Overhead;Lift;Hygiene/Grooming;Dressing;Carry;Sleep;Bed  Mobility;Bathing    Examination-Participation Restrictions Occupation;Meal Prep;Community Activity;Cleaning;Interpersonal Relationship;Yard Work    Stability/Clinical Decision Making Stable/Uncomplicated    Rehab Potential Good    PT Frequency Other (comment)   2-3x/week   PT Duration Other (comment)   10 weeks   PT Treatment/Interventions ADLs/Self Care Home Management;Cryotherapy;Electrical Stimulation;Iontophoresis 4mg /ml Dexamethasone;Moist Heat;Balance training;Therapeutic exercise;Therapeutic activities;Functional mobility training;Stair training;Gait training;DME Instruction;Ultrasound;Neuromuscular re-education;Patient/family education;Passive range of motion;Spinal Manipulations;Joint Manipulations;Dry needling;Taping;Vasopneumatic Device;Manual techniques    PT Next Visit Plan DN if desired.  Continued trigger point release techniques and progressive strengthening.    PT Home Exercise Plan [email or web address]    Consulted and Agree with Plan of Care Patient             Patient will benefit from skilled therapeutic intervention in order to improve the following deficits and impairments:  Decreased endurance, Hypomobility, Decreased activity tolerance, Decreased strength, Impaired UE functional use, Pain, Decreased mobility, Decreased range of motion, Impaired perceived functional ability, Improper body mechanics, Impaired flexibility, Decreased coordination, Increased edema  Visit Diagnosis: Chronic right shoulder pain  Muscle weakness (generalized)  Stiffness of right shoulder, not elsewhere classified  Localized edema     Problem List Patient Active Problem List   Diagnosis Date Noted   Unilateral conductive hearing loss 02/26/2021   Pre-syncope 02/26/2021   Complete tear of right rotator cuff    Biceps tendonitis on right    Degenerative superior labral anterior-to-posterior (SLAP) tear of right shoulder    Acute pain of  right shoulder 01/11/2020   Blood pressure check 10/20/2019   Mood changes 04/12/2019   Acute midline low back pain without sciatica 02/13/2019   Erectile dysfunction 07/23/2018   Ganglion cyst of dorsum of left wrist 04/20/2018   Prediabetes 04/17/2016   OBESITY, NOS 04/16/2006   Chyrel MassonMichael Maysun Meditz, PT, DPT, OCS, ATC 04/25/21  3:02 PM    [facility] [address] [place], [place], [address] Phone: [phone]   Fax:  [phone]  Name: Annye Ruskbdul-Rashid I Bole MRN: [id number] Date of Birth: 06/21/1960

## 2021-04-25 NOTE — Patient Instructions (Signed)
Access Code: [email or web address] ?URL: [email or web address] ?Date: 04/25/2021 ?Prepared by: Chyrel Masson ? ?Exercises ?Seated Scapular Retraction - 2-3 x daily - 7 x weekly - 1-2 sets - 10 reps - 5 hold ?Sidelying Shoulder Abduction Palm Forward - 2 x daily - 7 x weekly - 3 sets - 10-15 reps ?Corner Pec Major Stretch - 2-3 x daily - 7 x weekly - 1 sets - 5 reps - 30 hold ?Standing Shoulder Flexion to 90 Degrees with Dumbbells - 1-2 x daily - 7 x weekly - 2-3 sets - 10 reps ?Standing Shoulder Posterior Capsule Stretch (Mirrored) - 2-3 x daily - 7 x weekly - 1 sets - 5 reps - 15-30 hold ?Sleeper Stretch (Mirrored) - 2-3 x daily - 7 x weekly - 1 sets - 5 reps - 30 hold ?Standing Shoulder Internal Rotation Stretch with Towel - 2-3 x daily - 7 x weekly - 1 sets - 5 reps - 30 hold ?Shoulder External Rotation with Anchored Resistance (Mirrored) - 2 x daily - 7 x weekly - 3 sets - 10 reps ? ?

## 2021-04-30 ENCOUNTER — Ambulatory Visit (INDEPENDENT_AMBULATORY_CARE_PROVIDER_SITE_OTHER): Payer: No Typology Code available for payment source | Admitting: Rehabilitative and Restorative Service Providers"

## 2021-04-30 ENCOUNTER — Other Ambulatory Visit: Payer: Self-pay

## 2021-04-30 ENCOUNTER — Encounter: Payer: Self-pay | Admitting: Rehabilitative and Restorative Service Providers"

## 2021-04-30 DIAGNOSIS — G8929 Other chronic pain: Secondary | ICD-10-CM

## 2021-04-30 DIAGNOSIS — M6281 Muscle weakness (generalized): Secondary | ICD-10-CM

## 2021-04-30 DIAGNOSIS — M25611 Stiffness of right shoulder, not elsewhere classified: Secondary | ICD-10-CM

## 2021-04-30 DIAGNOSIS — R600 Localized edema: Secondary | ICD-10-CM

## 2021-04-30 DIAGNOSIS — M25511 Pain in right shoulder: Secondary | ICD-10-CM

## 2021-04-30 NOTE — Therapy (Signed)
[facility] ?[address] ?[place], [place], [address] ?Phone: [phone]   Fax:  [phone] ? ?Physical Therapy Treatment ? ?Patient Details  ?Name: DEUNDRE THONG ?MRN: [id number] ?Date of Birth: 1960-03-13 ?Referring Provider (PT): August Saucer Corrie Mckusick, MD ? ? ?Encounter Date: 04/30/2021 ? ? PT End of Session - 04/30/21 1426   ? ? Visit Number 15   ? Number of Visits 20   ? Date for PT Re-Evaluation 05/08/21   ? Authorization Type [employer]   ? PT Start Time 1427   ? PT Stop Time 1506   ? PT Time Calculation (min) 39 min   ? Activity Tolerance Patient tolerated treatment well   ? Behavior During Therapy [facility] for tasks assessed/performed   ? ?  ?  ? ?  ? ? ?Past Medical History:  ?Diagnosis Date  ? Bug bite 08/09/2018  ? Duodenal ulcer due to Helicobacter pylori 09/25/2010  ? EGD confirmed that duodenal ulcers and H. pylori gastritis in 08/2009. Treated with triple  therapy at [facility].   ? Gallbladder polyp 09/25/2010  ? GERD (gastroesophageal reflux disease)   ? Major depression in remission [facility])   ? OTHER DISEASES OF NASAL CAVITY AND SINUSES 02/23/2008  ? Qualifier: Diagnosis of  By: Daphine Deutscher MD, Corrie Dandy    ? RHINITIS, CHRONIC 04/16/2006  ? Qualifier: Diagnosis of  By: Bebe Shaggy    ? Screening for prostate cancer 08/12/2019  ? TENNIS ELBOW 04/16/2006  ? Qualifier: Diagnosis of  By: Bebe Shaggy    ? Tinea corporis 06/01/2017  ? ? ?Past Surgical History:  ?Procedure Laterality Date  ? APPENDECTOMY  1981  ? CHOLECYSTECTOMY  10/15/10  ? Path results revealed chronic cholecystitis  ? ELBOW SURGERY  2005  ? right  ? SHOULDER ARTHROSCOPY WITH SUBACROMIAL DECOMPRESSION, ROTATOR CUFF REPAIR AND BICEP TENDON REPAIR Right 02/05/2021  ? Procedure: RIGHT SHOULDER ARTHROSCOPY, SUBACROMIAL DECOMPRESSION, DEBRIDEMENT, MINI OPEN ROTATOR CUFF TEAR REPAIR, BICEPS TENODESIS;  Surgeon: Cammy Copa, MD;  Location: [facility] OR;  Service: Orthopedics;  Laterality: Right;  ? UPPER  GASTROINTESTINAL ENDOSCOPY    ? ? ?There were no vitals filed for this visit. ? ? Subjective Assessment - 04/30/21 1429   ? ? Subjective Pt. stated variable pain complaints c movement.  Usually not having pain at rest.  End range still painful behind back and limited.   ? Limitations House hold activities;Lifting   ? Patient Stated Goals Reduce pain, return to work   ? Currently in Pain? No/denies   ? Pain Score 0-No pain   no pain at rest  ? Pain Onset More than a month ago   ? ?  ?  ? ?  ? ? ? ? ? [facility] PT Assessment - 04/30/21 0001   ? ?  ? Assessment  ? Medical Diagnosis S46.011D (ICD-10-CM) - Traumatic complete tear of right rotator cuff, subsequent encounter   ? Referring Provider (PT) August Saucer Corrie Mckusick, MD   ? Onset Date/Surgical Date 02/05/21   ? Hand Dominance Right   ?  ? AROM  ? Right Shoulder Internal Rotation 50 Degrees   in supine 45 deg abduction  ? Right Shoulder External Rotation 56 Degrees   in supine 45 deg abduction  ?  ? Strength  ? Right Shoulder Flexion 4/5   ? Right Shoulder ABduction 4+/5   ? Right Shoulder Internal Rotation 5/5   ? Right Shoulder External Rotation 4/5   ? ?  ?  ? ?  ? ? ? ? ? ? ? ? ? ? ? ? ? ? ? ?  [facility] Adult PT Treatment/Exercise - 04/30/21 0001   ? ?  ? Shoulder Exercises: Standing  ? Other Standing Exercises 1 lb bar GH ext bilateral x 10 (2-3 second hold), 1 lb bar IR behind back bilateral x 10 (2-3 second hold)   ?  ? Shoulder Exercises: Pulleys  ? Flexion 2 minutes   ? ABduction 2 minutes   ? Other Pulley Exercises IR stretch behind back 30 sec x 5 Rt   ?  ? Shoulder Exercises: Stretch  ? Other Shoulder Stretches supine IR low load long duration stretch in 75 deg abduction - 1.5 lbs 3 mins   ?  ? Manual Therapy  ? Manual therapy comments Standing inferior glide mobilization c movement c IR pulley stretching, AP g4 glides Rt GH joint   ? ?  ?  ? ?  ? ? ? ? ? ? ? ? ? ? ? ? PT Short Term Goals - 03/13/21 1514   ? ?  ? PT SHORT TERM GOAL #1  ? Title Patient will  demonstrate independent use of home exercise program to maintain progress from in clinic treatments.   ? Time 3   ? Period Weeks   ? Status Achieved   ? Target Date 03/20/21   ? ?  ?  ? ?  ? ? ? ? PT Long Term Goals - 04/23/21 1455   ? ?  ? PT LONG TERM GOAL #1  ? Title Patient will demonstrate/report pain at worst less than or equal to 2/10 to facilitate minimal limitation in daily activity secondary to pain symptoms.   ? Time 10   ? Period Weeks   ? Status On-going   ? Target Date 05/08/21   ?  ? PT LONG TERM GOAL #2  ? Title Patient will demonstrate independent use of home exercise program to facilitate ability to maintain/progress functional gains from skilled physical therapy services.   ? Time 10   ? Period Weeks   ? Status On-going   ? Target Date 05/08/21   ?  ? PT LONG TERM GOAL #3  ? Title Pt. will demonstrate FOTO outcome > or = 64 % to indicated reduced disability due to condition.   ? Time 10   ? Period Weeks   ? Status On-going   ? Target Date 05/08/21   ?  ? PT LONG TERM GOAL #4  ? Title Patient will demonstrate Rt GH joint mobility WFL to facilitate usual self care, dressing, reaching overhead at PLOF s limitation due to symptoms.   ? Time 10   ? Period Weeks   ? Status On-going   ? Target Date 05/08/21   ?  ? PT LONG TERM GOAL #5  ? Title Patient will demonstrate Rt UE MMT 5/5, with 15 % dynamometry of Lt throughout to facilitate usual lifting, carrying in functional activity to PLOF s limitation.   ? Time 10   ? Period Weeks   ? Status On-going   ? Target Date 05/08/21   ?  ? PT LONG TERM GOAL #6  ? Title Pt. will return to work at [employer] s limitation   ? Time 10   ? Period Weeks   ? Status On-going   ? Target Date 05/08/21   ? ?  ?  ? ?  ? ? ? ? ? ? ? ? Plan - 04/30/21 1453   ? ? Clinical Impression Statement Strength assessment showed improvements in abilities compared to previous c  flexion, ER strength of Rt shoulder with continued room to improve to help functional lifting.  Most noted  restriction continued to be general stiffness, most noted in IR/HBB movement continued at this time.  Continued increased focus in clinic on promoting improvement in capsular movement, specifically posterior capsule as well as in review of HEP.   ? Examination-Activity Limitations Reach Overhead;Lift;Hygiene/Grooming;Dressing;Carry;Sleep;Bed Mobility;Bathing   ? Examination-Participation Restrictions Occupation;Meal Prep;Community Activity;Cleaning;Interpersonal Relationship;Pincus Badder Work   ? Stability/Clinical Decision Making Stable/Uncomplicated   ? Rehab Potential Good   ? PT Frequency Other (comment)   2-3x/week  ? PT Duration Other (comment)   10 weeks  ? PT Treatment/Interventions ADLs/Self Care Home Management;Cryotherapy;Electrical Stimulation;Iontophoresis 4mg /ml Dexamethasone;Moist Heat;Balance training;Therapeutic exercise;Therapeutic activities;Functional mobility training;Stair training;Gait training;DME Instruction;Ultrasound;Neuromuscular re-education;Patient/family education;Passive range of motion;Spinal Manipulations;Joint Manipulations;Dry needling;Taping;Vasopneumatic Device;Manual techniques   ? PT Next Visit Plan Posterior capsule manual and ther ex intervention for mobility gains.  MD progress note   ? PT Home Exercise Plan [email or web address]   ? Consulted and Agree with Plan of Care Patient   ? ?  ?  ? ?  ? ? ?Patient will benefit from skilled therapeutic intervention in order to improve the following deficits and impairments:  Decreased endurance, Hypomobility, Decreased activity tolerance, Decreased strength, Impaired UE functional use, Pain, Decreased mobility, Decreased range of motion, Impaired perceived functional ability, Improper body mechanics, Impaired flexibility, Decreased coordination, Increased edema ? ?Visit Diagnosis: ?Chronic right shoulder pain ? ?Muscle weakness (generalized) ? ?Stiffness of right shoulder, not elsewhere classified ? ?Localized edema ? ? ? ? ?Problem List ?Patient Active  Problem List  ? Diagnosis Date Noted  ? Unilateral conductive hearing loss 02/26/2021  ? Pre-syncope 02/26/2021  ? Complete tear of right rotator cuff   ? Biceps tendonitis on right   ? Degenerative superior labral an

## 2021-05-02 ENCOUNTER — Ambulatory Visit (INDEPENDENT_AMBULATORY_CARE_PROVIDER_SITE_OTHER): Payer: No Typology Code available for payment source | Admitting: Surgical

## 2021-05-02 ENCOUNTER — Ambulatory Visit (INDEPENDENT_AMBULATORY_CARE_PROVIDER_SITE_OTHER): Payer: No Typology Code available for payment source | Admitting: Rehabilitative and Restorative Service Providers"

## 2021-05-02 ENCOUNTER — Encounter: Payer: Self-pay | Admitting: Rehabilitative and Restorative Service Providers"

## 2021-05-02 ENCOUNTER — Other Ambulatory Visit: Payer: Self-pay

## 2021-05-02 DIAGNOSIS — G8929 Other chronic pain: Secondary | ICD-10-CM

## 2021-05-02 DIAGNOSIS — M25511 Pain in right shoulder: Secondary | ICD-10-CM | POA: Diagnosis not present

## 2021-05-02 DIAGNOSIS — M25611 Stiffness of right shoulder, not elsewhere classified: Secondary | ICD-10-CM | POA: Diagnosis not present

## 2021-05-02 DIAGNOSIS — M6281 Muscle weakness (generalized): Secondary | ICD-10-CM | POA: Diagnosis not present

## 2021-05-02 DIAGNOSIS — R600 Localized edema: Secondary | ICD-10-CM

## 2021-05-02 DIAGNOSIS — Z9889 Other specified postprocedural states: Secondary | ICD-10-CM

## 2021-05-02 NOTE — Therapy (Addendum)
[place] [facility] ?[address] ?[place], [place], [address] ?Phone: [phone]   Fax:  [phone] ? ?Physical Therapy Treatment /Progress Note / Recertification ? ?Patient Details  ?Name: Tony Nguyen ?MRN: [id number] ?Date of Birth: 05-Mar-1960 ?Referring Provider (PT): August Saucer Corrie Mckusick, MD ? ?Progress Note ?Reporting Period 02/27/2021  to 05/02/2021 ? ?See note below for Objective Data and Assessment of Progress/Goals.  ? ? ? ? ?Encounter Date: 05/02/2021 ? ? ? 05/02/21 0948  ?PT Visits / Re-Eval  ?Visit Number 16  ?Number of Visits 28  ?Date for PT Re-Evaluation 06/27/21  ?Authorization  ?Authorization Type [employer]  ?PT Time Calculation  ?PT Start Time [id number]  ?PT Stop Time 1007  ?PT Time Calculation (min) 40 min  ?PT - End of Session  ?Activity Tolerance Patient tolerated treatment well  ?Behavior During Therapy [facility] for tasks assessed/performed  ? ? ?  ? ? ?Past Medical History:  ?Diagnosis Date  ? Bug bite 08/09/2018  ? Duodenal ulcer due to Helicobacter pylori 09/25/2010  ? EGD confirmed that duodenal ulcers and H. pylori gastritis in 08/2009. Treated with triple  therapy at [facility].   ? Gallbladder polyp 09/25/2010  ? GERD (gastroesophageal reflux disease)   ? Major depression in remission [facility])   ? OTHER DISEASES OF NASAL CAVITY AND SINUSES 02/23/2008  ? Qualifier: Diagnosis of  By: Daphine Deutscher MD, Corrie Dandy    ? RHINITIS, CHRONIC 04/16/2006  ? Qualifier: Diagnosis of  By: Bebe Shaggy    ? Screening for prostate cancer 08/12/2019  ? TENNIS ELBOW 04/16/2006  ? Qualifier: Diagnosis of  By: Bebe Shaggy    ? Tinea corporis 06/01/2017  ? ? ?Past Surgical History:  ?Procedure Laterality Date  ? APPENDECTOMY  1981  ? CHOLECYSTECTOMY  10/15/10  ? Path results revealed chronic cholecystitis  ? ELBOW SURGERY  2005  ? right  ? SHOULDER ARTHROSCOPY WITH SUBACROMIAL DECOMPRESSION, ROTATOR CUFF REPAIR AND BICEP TENDON REPAIR Right 02/05/2021  ? Procedure: RIGHT SHOULDER  ARTHROSCOPY, SUBACROMIAL DECOMPRESSION, DEBRIDEMENT, MINI OPEN ROTATOR CUFF TEAR REPAIR, BICEPS TENODESIS;  Surgeon: Cammy Copa, MD;  [facility];  Service: Orthopedics;  Laterality: Right;  ? UPPER GASTROINTESTINAL ENDOSCOPY    ? ? ?There were no vitals filed for this visit. ? ? Subjective Assessment - 05/02/21 0947   ? ? Subjective Pt indicated he was stretching behind back too hard and felt pain in Rt shoulder from Tuesday night until today.  Rated at 7/10.  Noticed at rest and c movement.   ? Limitations House hold activities;Lifting   ? Patient Stated Goals Reduce pain, return to work   ? Currently in Pain? Yes   ? Pain Score 7    ? Pain Location Shoulder   ? Pain Orientation Right;Anterior   ? Pain Descriptors / Indicators Sharp;Aching;Sore   ? Pain Type Chronic pain   ? Pain Onset More than a month ago   ? Pain Frequency Constant   ? Aggravating Factors  from reaching behind back.   ? Pain Relieving Factors nothing much since Tuesday   ? ?  ?  ? ?  ? ? ? ? ? [facility] PT Assessment - 05/02/21 0001   ? ?  ? Assessment  ? Medical Diagnosis S46.011D (ICD-10-CM) - Traumatic complete tear of right rotator cuff, subsequent encounter   ? Referring Provider (PT) August Saucer Corrie Mckusick, MD   ? Onset Date/Surgical Date 02/05/21   ? Hand Dominance Right   ?  ? Observation/Other Assessments  ?  Focus on Therapeutic Outcomes (FOTO)  update 46%   ?  ? AROM  ? Right Shoulder Flexion 142 Degrees   measured in supine  ? Right Shoulder ABduction 112 Degrees   measured in supine  ? Right Shoulder Internal Rotation 45 Degrees   measued in supine 45 deg abduction  ? Right Shoulder External Rotation 65 Degrees   measued in supine 45 deg abduction  ?  ? Strength  ? Right Shoulder Flexion 4+/5   18.6, 17.9 lbs  ? Right Shoulder ABduction 4+/5   19.4, 19 lbs  ? Right Shoulder External Rotation 4+/5   16 lbs  ? ?  ?  ? ?  ? ? ? ? ? ? ? ? ? ? ? ? ? ? ? ? [facility] Adult PT Treatment/Exercise - 05/02/21 0001   ? ?  ? Shoulder Exercises:  Standing  ? Other Standing Exercises standing green band ER c flexion punch 2 x 10, standing horizontal abduction c trunk rotation in 90 deg flexion x 10 each direction.   ?  ? Shoulder Exercises: ROM/Strengthening  ? UBE (Upper Arm Bike) Lvl 3.5 3 mins fwd/back each way   ?  ? Shoulder Exercises: Stretch  ? Other Shoulder Stretches supine cross arm stretch 30 sec x 5 Rt   ?  ? Manual Therapy  ? Manual therapy comments Supine mobilization c movement for ER c sustained glide 2 x 10.  Posterior g4 Rt GH joint mobs, prone mid and upper thoracic regional rotation manipulation x 2 followed by g3 cPA mobs T3-T7.   ? ?  ?  ? ?  ? ? ? Trigger Point Dry Needling - 05/02/21 0001   ? ? Consent Given? Yes   ? Education Handout Provided Previously provided   ? Muscles Treated Upper Quadrant Infraspinatus   Rt  ? Infraspinatus Response Twitch response elicited   poor tolerance overall  ? ?  ?  ? ?  ? ? ? ? ? ? ? ? ? ? PT Short Term Goals - 03/13/21 1514   ? ?  ? PT SHORT TERM GOAL #1  ? Title Patient will demonstrate independent use of home exercise program to maintain progress from in clinic treatments.   ? Time 3   ? Period Weeks   ? Status Achieved   ? Target Date 03/20/21   ? ?  ?  ? ?  ? ? ? ? ? 05/02/21 1013  ?PT LONG TERM GOAL #1  ?Title Patient will demonstrate/report pain at worst less than or equal to 2/10 to facilitate minimal limitation in daily activity secondary to pain symptoms.  ?Time 8  ?Period Weeks  ?Status Revised  ?Target Date 06/27/21  ?PT LONG TERM GOAL #2  ?Title Patient will demonstrate independent use of home exercise program to facilitate ability to maintain/progress functional gains from skilled physical therapy services.  ?Time 8  ?Period Weeks  ?Status Revised  ?Target Date 06/27/21  ?PT LONG TERM GOAL #3  ?Title Pt. will demonstrate FOTO outcome > or = 64 % to indicated reduced disability due to condition.  ?Time 8  ?Period Weeks  ?Status Revised  ?Target Date 06/27/21  ?PT LONG TERM GOAL #4  ?Title  Patient will demonstrate Rt GH joint mobility WFL to facilitate usual self care, dressing, reaching overhead at PLOF s limitation due to symptoms.  ?Time 8  ?Period Weeks  ?Status Revised  ?Target Date 06/27/21  ?PT LONG TERM GOAL #5  ?Title Patient will  demonstrate Rt UE MMT 5/5, with 15 % dynamometry of Lt throughout to facilitate usual lifting, carrying in functional activity to PLOF s limitation.  ?Time 8  ?Period Weeks  ?Status Revised  ?Target Date 06/27/21  ?PT LONG TERM GOAL #6  ?Title Pt. will return to work at [employer] s limitation  ?Time 8  ?Period Weeks  ?Status Revised  ?Target Date 06/27/21  ? ? ? ? ? ? ? ? Plan - 05/02/21 1003   ? ? Clinical Impression Statement Pt. has attended 16 visits overall during course of treatment to this point.  Global Rating of Change +3 somewhat better. See objective data for updated information. Pt. has demonstrated good improvements in strength and most active movements compared to previous measurements.  Pt. has room to continue to improve in elevation range and strength but making progress.  Of note, Pt. has presented c increased pain complaints and restriction c IR/hand behind back movements that have degraded over last few weeks.  Assessment revealed increased capsular restriction and myofascial restriction in those directions.  Pt. has been routine in clinic and at home with stretching to promote movement in these directions but Pt. has indicated a progression of pain at the end range which limits his stretching.  Pt may continue to benefit from skilled PT services c adjustments based off MD follow up visit.   ? Examination-Activity Limitations Reach Overhead;Lift;Hygiene/Grooming;Dressing;Carry;Sleep;Bed Mobility;Bathing   ? Examination-Participation Restrictions Occupation;Meal Prep;Community Activity;Cleaning;Interpersonal Relationship;[employer] Work   ? Stability/Clinical Decision Making Stable/Uncomplicated   ? Rehab Potential Good   ? PT Frequency Other (comment)    2-3x/week  ? PT Duration Other (comment)   10 weeks  ? PT Treatment/Interventions ADLs/Self Care Home Management;Cryotherapy;Electrical Stimulation;Iontophoresis 4mg /ml Dexamethasone;Moist Heat;Balance traini

## 2021-05-05 ENCOUNTER — Encounter: Payer: Self-pay | Admitting: Orthopedic Surgery

## 2021-05-05 MED ORDER — IBUPROFEN 800 MG PO TABS: 800.0000 mg | ORAL_TABLET | Freq: Three times a day (TID) | ORAL | 2 refills | Status: AC | PRN

## 2021-05-05 NOTE — Progress Notes (Signed)
? ?Post-Op Visit Note ?  ?Patient: Tony Nguyen           ?Date of Birth: Apr 20, 1960           ?MRN: [id number] ?Visit Date: 05/02/2021 ?PCP: Katha Cabal, DO ? ? ?Assessment & Plan: ? ?Chief Complaint:  ?Chief Complaint  ?Patient presents with  ? Right Shoulder - Follow-up  ? ?Visit Diagnoses:  ?1. Status post rotator cuff repair   ? ? ?Plan: Patient is a 61 year old male who presents s/p right shoulder arthroscopy with biceps tenodesis and mini open rotator cuff tear repair on 02/05/2021.  Overall still feels he is progressing well.  Takes occasional ibuprofen for pain.  Still having some trouble reaching behind his back and above his head due to stiffness.  He still has difficulty sleeping.  Has not been able to return to work yet which involves lifting up to 50 pounds from the floor up to above shoulder level at times.  On exam, incisions are well-healed.  There is no significant crepitus noted with passive motion of the shoulder.  He has 25 degrees external rotation, 70 degrees abduction, 125 degrees forward flexion.  Excellent rotator cuff strength of supraspinatus, subscapularis rated 5/5 with 5 -/5 strength of infraspinatus.  Axillary nerve is intact with deltoid firing.  5/5 motor strength of bicep flexion and supination without any pain.  Plan is to continue with physical therapy and 6-week return for clinical recheck and determination of return to work at that point.  Refill ibuprofen today. ? ?Follow-Up Instructions: No follow-ups on file.  ? ?Orders:  ?No orders of the defined types were placed in this encounter. ? ?No orders of the defined types were placed in this encounter. ? ? ?Imaging: ?No results found. ? ?PMFS History: ?Patient Active Problem List  ? Diagnosis Date Noted  ? Unilateral conductive hearing loss 02/26/2021  ? Pre-syncope 02/26/2021  ? Complete tear of right rotator cuff   ? Biceps tendonitis on right   ? Degenerative superior labral anterior-to-posterior (SLAP) tear of  right shoulder   ? Acute pain of right shoulder 01/11/2020  ? Blood pressure check 10/20/2019  ? Mood changes 04/12/2019  ? Acute midline low back pain without sciatica 02/13/2019  ? Erectile dysfunction 07/23/2018  ? Ganglion cyst of dorsum of left wrist 04/20/2018  ? Prediabetes 04/17/2016  ? OBESITY, NOS 04/16/2006  ? ?Past Medical History:  ?Diagnosis Date  ? Bug bite 08/09/2018  ? Duodenal ulcer due to Helicobacter pylori 09/25/2010  ? EGD confirmed that duodenal ulcers and H. pylori gastritis in 08/2009. Treated with triple  therapy at [facility].   ? Gallbladder polyp 09/25/2010  ? GERD (gastroesophageal reflux disease)   ? Major depression in remission [facility])   ? OTHER DISEASES OF NASAL CAVITY AND SINUSES 02/23/2008  ? Qualifier: Diagnosis of  By: Daphine Deutscher MD, Corrie Dandy    ? RHINITIS, CHRONIC 04/16/2006  ? Qualifier: Diagnosis of  By: Bebe Shaggy    ? Screening for prostate cancer 08/12/2019  ? TENNIS ELBOW 04/16/2006  ? Qualifier: Diagnosis of  By: Bebe Shaggy    ? Tinea corporis 06/01/2017  ?  ?Family History  ?Problem Relation Age of Onset  ? High blood pressure Father   ? Kidney disease Cousin   ? Diabetes Neg Hx   ? Early death Neg Hx   ? Heart disease Neg Hx   ? Hyperlipidemia Neg Hx   ? Hypertension Neg Hx   ?  ?Past Surgical History:  ?  Procedure Laterality Date  ? APPENDECTOMY  1981  ? CHOLECYSTECTOMY  10/15/10  ? Path results revealed chronic cholecystitis  ? ELBOW SURGERY  2005  ? right  ? SHOULDER ARTHROSCOPY WITH SUBACROMIAL DECOMPRESSION, ROTATOR CUFF REPAIR AND BICEP TENDON REPAIR Right 02/05/2021  ? Procedure: RIGHT SHOULDER ARTHROSCOPY, SUBACROMIAL DECOMPRESSION, DEBRIDEMENT, MINI OPEN ROTATOR CUFF TEAR REPAIR, BICEPS TENODESIS;  Surgeon: Cammy Copa, MD;  Location: [facility];  Service: Orthopedics;  Laterality: Right;  ? UPPER GASTROINTESTINAL ENDOSCOPY    ? ?Social History  ? ?Occupational History  ? Occupation: Location manager   ?  Employer: [employer]  ?  Comment: [employer]   ?Tobacco Use  ? Smoking status: Never  ? Smokeless tobacco: Never  ?Vaping Use  ? Vaping Use: Never used  ?Substance and Sexual Activity  ? Alcohol use: No  ? Drug use: No  ? Sexual activity: Yes  ? ? ? ?

## 2021-05-07 ENCOUNTER — Other Ambulatory Visit: Payer: Self-pay

## 2021-05-07 ENCOUNTER — Encounter: Payer: Self-pay | Admitting: Rehabilitative and Restorative Service Providers"

## 2021-05-07 ENCOUNTER — Ambulatory Visit (INDEPENDENT_AMBULATORY_CARE_PROVIDER_SITE_OTHER): Payer: No Typology Code available for payment source | Admitting: Rehabilitative and Restorative Service Providers"

## 2021-05-07 DIAGNOSIS — R600 Localized edema: Secondary | ICD-10-CM

## 2021-05-07 DIAGNOSIS — M6281 Muscle weakness (generalized): Secondary | ICD-10-CM | POA: Diagnosis not present

## 2021-05-07 DIAGNOSIS — G8929 Other chronic pain: Secondary | ICD-10-CM

## 2021-05-07 DIAGNOSIS — M25511 Pain in right shoulder: Secondary | ICD-10-CM

## 2021-05-07 DIAGNOSIS — M25611 Stiffness of right shoulder, not elsewhere classified: Secondary | ICD-10-CM

## 2021-05-07 NOTE — Therapy (Addendum)
[place] [facility] ?[address] ?[place], [place], [address] ?Phone: [phone]   Fax:  [phone] ? ?Physical Therapy Treatment ? ?Patient Details  ?Name: Tony Nguyen ?MRN: [id number] ?Date of Birth: [birth year] ?Referring Provider (PT): Marlou Sa Tonna Corner, MD ? ? ?Encounter Date: 05/07/2021 ? ? PT End of Session - 05/07/21 1417   ? ? Visit Number 17   ? Number of Visits 28  ? Date for PT Re-Evaluation 06/27/2021  ? Authorization Type [employer]   ? PT Start Time 1426   ? PT Stop Time 1506   ? PT Time Calculation (min) 40 min   ? Activity Tolerance Patient tolerated treatment well   ? Behavior During Therapy [facility] for tasks assessed/performed   ? ?  ?  ? ?  ? ? ?Past Medical History:  ?Diagnosis Date  ? Bug bite 08/09/2018  ? Duodenal ulcer due to Helicobacter pylori [id number]  ? EGD confirmed that duodenal ulcers and H. pylori gastritis in 08/2009. Treated with triple  therapy at [place].   ? Gallbladder polyp 09/25/2010  ? GERD (gastroesophageal reflux disease)   ? Major depression in remission [facility])   ? OTHER DISEASES OF NASAL CAVITY AND SINUSES 02/23/2008  ? Qualifier: Diagnosis of  By: Hassell Done MD, Stanton Kidney    ? RHINITIS, CHRONIC 04/16/2006  ? Qualifier: Diagnosis of  By: Herma Ard    ? Screening for prostate cancer 08/12/2019  ? TENNIS ELBOW 04/16/2006  ? Qualifier: Diagnosis of  By: Herma Ard    ? Tinea corporis 06/01/2017  ? ? ?Past Surgical History:  ?Procedure Laterality Date  ? APPENDECTOMY  1981  ? CHOLECYSTECTOMY  10/15/10  ? Path results revealed chronic cholecystitis  ? ELBOW SURGERY  2005  ? right  ? SHOULDER ARTHROSCOPY WITH SUBACROMIAL DECOMPRESSION, ROTATOR CUFF REPAIR AND BICEP TENDON REPAIR Right 02/05/2021  ? Procedure: RIGHT SHOULDER ARTHROSCOPY, SUBACROMIAL DECOMPRESSION, DEBRIDEMENT, MINI OPEN ROTATOR CUFF TEAR REPAIR, BICEPS TENODESIS;  Surgeon: Meredith Pel, MD;  Location: [facility];  Service: Orthopedics;  Laterality: Right;  ? UPPER  GASTROINTESTINAL ENDOSCOPY    ? ? ?There were no vitals filed for this visit. ? ? Subjective Assessment - 05/07/21 1416   ? ? Subjective Pt saw MD office for follow up with indications of recommendation for continued skilled PT services. Pt indicated doing ok until cutting some veggies for meal.  Pain went up to 8/10, today 4/10 at worst.   ? Limitations House hold activities;Lifting   ? Patient Stated Goals Reduce pain, return to work   ? Currently in Pain? Yes   ? Pain Score 4    ? Pain Location Shoulder   ? Pain Orientation Right;Anterior   ? Pain Descriptors / Indicators Aching;Sore   ? Pain Type Chronic pain   ? Pain Onset More than a month ago   ? Pain Frequency Intermittent   ? Aggravating Factors  cutting veggies   ? Pain Relieving Factors resting helped.   ? ?  ?  ? ?  ? ? ? ? ? ? ? ? ? ? ? ? ? ? ? ? ? ? ? ? [facility] Adult PT Treatment/Exercise - 05/07/21 0001   ? ?  ? Shoulder Exercises: Standing  ? Other Standing Exercises standing flexion to abd to side and reverse 1 lb 2 x 10 bilateral, ER walk out isometric holds blue band c arm at side 10 sec x 6, ER c flexion punch blue band 2 x 10   ?  Other Standing Exercises standing ball circles in 90 deg flexion at wall 30 sec x 2 cw, ccw each   ?  ? Shoulder Exercises: ROM/Strengthening  ? UBE (Upper Arm Bike) Lvl 4.0 4 mins fwd/back each way UE only with 10 second faster interval at end of each minute   ? Lat Pull Limitations 2 x 15 20 lbs   ? Cybex Press Limitations Rt arm only x 10 10 lbs, x 10 5 lbs   ? Cybex Row Limitations 2 x 15 25 lbs   ?  ? Manual Therapy  ? Manual therapy comments Standing mobilization c movement IR rope stretch c inferior glide 3 x 10 Rt arm   ? ?  ?  ? ?  ? ? ? ? ? ? ? ? ? ? ? ? PT Short Term Goals - 03/13/21 1514   ? ?  ? PT SHORT TERM GOAL #1  ? Title Patient will demonstrate independent use of home exercise program to maintain progress from in clinic treatments.   ? Time 3   ? Period Weeks   ? Status Achieved   ? Target Date 03/20/21    ? ?  ?  ? ?  ? ? ? ? ? 05/09/21 0001  ?PT LONG TERM GOAL #1  ?Title Patient will demonstrate/report pain at worst less than or equal to 2/10 to facilitate minimal limitation in daily activity secondary to pain symptoms.  ?Time 8  ?Period Weeks  ?Status Revised  ?Target Date 06/27/21  ?PT LONG TERM GOAL #2  ?Title Patient will demonstrate independent use of home exercise program to facilitate ability to maintain/progress functional gains from skilled physical therapy services.  ?Time 8  ?Period Weeks  ?Status Revised  ?Target Date 06/27/21  ?PT LONG TERM GOAL #3  ?Title Pt. will demonstrate FOTO outcome > or = 64 % to indicated reduced disability due to condition.  ?Time 8  ?Period Weeks  ?Status Revised  ?Target Date 06/27/21  ?PT LONG TERM GOAL #4  ?Title Patient will demonstrate Rt [place] joint mobility WFL to facilitate usual self care, dressing, reaching overhead at PLOF s limitation due to symptoms.  ?Time 8  ?Period Weeks  ?Status Revised  ?Target Date 06/27/21  ?PT LONG TERM GOAL #5  ?Title Patient will demonstrate Rt UE MMT 5/5, with 15 % dynamometry of Lt throughout to facilitate usual lifting, carrying in functional activity to PLOF s limitation.  ?Time 8  ?Period Weeks  ?Status Revised  ?Target Date 06/27/21  ?PT LONG TERM GOAL #6  ?Title Pt. will return to work at [employer] s limitation  ?Time 8  ?Period Weeks  ?Status Revised  ?Target Date 06/27/21  ? ? ? ? ? ? ? Plan - 05/07/21 1417   ? ? Clinical Impression Statement Pt presented c continued indication for skilled PT services to improve function and mobility from Rt shoulder for daily and work activity.   ? Examination-Activity Limitations Reach Overhead;Lift;Hygiene/Grooming;Dressing;Carry;Sleep;Bed Mobility;Bathing   ? Examination-Participation Restrictions Occupation;Meal Prep;Community Activity;Cleaning;Interpersonal Relationship;[employer] Work   ? Stability/Clinical Decision Making Stable/Uncomplicated   ? Rehab Potential Good   ? PT Frequency Other (comment)    2-3x/week  ? PT Duration Other (comment)   10 weeks  ? PT Treatment/Interventions ADLs/Self Care Home Management;Cryotherapy;Electrical Stimulation;Iontophoresis 4mg /ml Dexamethasone;Moist Heat;Balance training;Therapeutic exercise;Therapeutic activities;Functional mobility training;Stair training;Gait training;DME Instruction;Ultrasound;Neuromuscular re-education;Patient/family education;Passive range of motion;Spinal Manipulations;Joint Manipulations;Dry needling;Taping;Vasopneumatic Device;Manual techniques   ? PT Next Visit Plan Progressive strengthening program continued  ? PT Home Exercise  Plan [id number]   ? Consulted and Agree with Plan of Care Patient   ? ?  ?  ? ?  ? ? ?Patient will benefit from skilled therapeutic intervention in order to improve the following deficits and impairments:  Decreased endurance, Hypomobility, Decreased activity tolerance, Decreased strength, Impaired UE functional use, Pain, Decreased mobility, Decreased range of motion, Impaired perceived functional ability, Improper body mechanics, Impaired flexibility, Decreased coordination, Increased edema ? ?Visit Diagnosis: ?Chronic right shoulder pain ? ?Muscle weakness (generalized) ? ?Stiffness of right shoulder, not elsewhere classified ? ?Localized edema ? ? ? ? ?Problem List ?Patient Active Problem List  ? Diagnosis Date Noted  ? Unilateral conductive hearing loss 02/26/2021  ? Pre-syncope 02/26/2021  ? Complete tear of right rotator cuff   ? Biceps tendonitis on right   ? Degenerative superior labral anterior-to-posterior (SLAP) tear of right shoulder   ? Acute pain of right shoulder 01/11/2020  ? Blood pressure check 10/20/2019  ? Mood changes 04/12/2019  ? Acute midline low back pain without sciatica 02/13/2019  ? Erectile dysfunction 07/23/2018  ? Ganglion cyst of dorsum of left wrist 04/20/2018  ? Prediabetes 04/17/2016  ? OBESITY, NOS 04/16/2006  ? ? ?Scot Jun, PT, DPT, OCS, ATC ?05/07/21  3:05 PM ? ? ? ?[facility] ?[address] ?[place], [place], [address] ?Phone: [phone]   Fax:  [phone] ? ?Name: JANNIE WENRICK ?MRN: [id number] ?Date of Birth: [birth year] ? ? ? ?

## 2021-05-08 ENCOUNTER — Telehealth: Payer: Self-pay | Admitting: Orthopedic Surgery

## 2021-05-08 NOTE — Telephone Encounter (Signed)
Pt needs a work note from Surgery to when released ? ? ?Please call pt when ready  ?

## 2021-05-08 NOTE — Telephone Encounter (Signed)
Note at front for pick up. Patient aware. 

## 2021-05-09 ENCOUNTER — Other Ambulatory Visit: Payer: Self-pay

## 2021-05-09 ENCOUNTER — Ambulatory Visit (INDEPENDENT_AMBULATORY_CARE_PROVIDER_SITE_OTHER): Payer: No Typology Code available for payment source | Admitting: Rehabilitative and Restorative Service Providers"

## 2021-05-09 ENCOUNTER — Encounter: Payer: Self-pay | Admitting: Rehabilitative and Restorative Service Providers"

## 2021-05-09 DIAGNOSIS — M25511 Pain in right shoulder: Secondary | ICD-10-CM | POA: Diagnosis not present

## 2021-05-09 DIAGNOSIS — M6281 Muscle weakness (generalized): Secondary | ICD-10-CM | POA: Diagnosis not present

## 2021-05-09 DIAGNOSIS — G8929 Other chronic pain: Secondary | ICD-10-CM

## 2021-05-09 DIAGNOSIS — M25611 Stiffness of right shoulder, not elsewhere classified: Secondary | ICD-10-CM

## 2021-05-09 DIAGNOSIS — R600 Localized edema: Secondary | ICD-10-CM

## 2021-05-09 NOTE — Addendum Note (Signed)
Addended by: Chyrel Masson B on: 05/09/2021 02:37 PM ? ? Modules accepted: Orders ? ?

## 2021-05-09 NOTE — Therapy (Signed)
[facility] ?[address] ?[place], [place], [address] ?Phone: [phone]   Fax:  [phone] ? ?Physical Therapy Treatment ? ?Patient Details  ?Name: Tony Nguyen ?MRN: [id number] ?Date of Birth: 04/15/1960 ?Referring Provider (PT): Marlou Sa Tonna Corner, MD ? ? ?Encounter Date: 05/09/2021 ? ? PT End of Session - 05/09/21 1425   ? ? Visit Number 18   ? Number of Visits 30   ? Date for PT Re-Evaluation 07/04/21   ? Authorization Type [employer]   ? PT Start Time 1423   ? PT Stop Time [id number]   ? PT Time Calculation (min) 41 min   ? Activity Tolerance Patient tolerated treatment well   ? Behavior During Therapy [facility] for tasks assessed/performed   ? ?  ?  ? ?  ? ? ?Past Medical History:  ?Diagnosis Date  ? Bug bite 08/09/2018  ? Duodenal ulcer due to Helicobacter pylori [id number]  ? EGD confirmed that duodenal ulcers and H. pylori gastritis in 08/2009. Treated with triple  therapy at [facility].   ? Gallbladder polyp 09/25/2010  ? GERD (gastroesophageal reflux disease)   ? Major depression in remission [facility])   ? OTHER DISEASES OF NASAL CAVITY AND SINUSES 02/23/2008  ? Qualifier: Diagnosis of  By: Hassell Done MD, Stanton Kidney    ? RHINITIS, CHRONIC 04/16/2006  ? Qualifier: Diagnosis of  By: Herma Ard    ? Screening for prostate cancer 08/12/2019  ? TENNIS ELBOW 04/16/2006  ? Qualifier: Diagnosis of  By: Herma Ard    ? Tinea corporis 06/01/2017  ? ? ?Past Surgical History:  ?Procedure Laterality Date  ? APPENDECTOMY  1981  ? CHOLECYSTECTOMY  10/15/10  ? Path results revealed chronic cholecystitis  ? ELBOW SURGERY  2005  ? right  ? SHOULDER ARTHROSCOPY WITH SUBACROMIAL DECOMPRESSION, ROTATOR CUFF REPAIR AND BICEP TENDON REPAIR Right 02/05/2021  ? Procedure: RIGHT SHOULDER ARTHROSCOPY, SUBACROMIAL DECOMPRESSION, DEBRIDEMENT, MINI OPEN ROTATOR CUFF TEAR REPAIR, BICEPS TENODESIS;  Surgeon: Meredith Pel, MD;  Location: [facility];  Service: Orthopedics;  Laterality: Right;  ? UPPER  GASTROINTESTINAL ENDOSCOPY    ? ? ?There were no vitals filed for this visit. ? ? Subjective Assessment - 05/09/21 1425   ? ? Subjective Pt indicated not having the pain in Rt shoulder that he mentioned the last time upon arrival.  Elbow was also fine after the visit last time.   ? Limitations House hold activities;Lifting   ? Patient Stated Goals Reduce pain, return to work   ? Currently in Pain? No/denies   ? Pain Score 0-No pain   ? Pain Onset More than a month ago   ? ?  ?  ? ?  ? ? ? ? ? [facility] PT Assessment - 05/09/21 0001   ? ?  ? Assessment  ? Medical Diagnosis S46.011D (ICD-10-CM) - Traumatic complete tear of right rotator cuff, subsequent encounter   ? Referring Provider (PT) Marlou Sa Tonna Corner, MD   ? Onset Date/Surgical Date 02/05/21   ? Hand Dominance Right   ?  ? Strength  ? Overall Strength Comments Rt middle trap 4/5, lower trap 3+/5   ? ?  ?  ? ?  ? ? ? ? ? ? ? ? ? ? ? ? ? ? ? ? [facility] Adult PT Treatment/Exercise - 05/09/21 0001   ? ?  ? Shoulder Exercises: Seated  ? Other Seated Exercises seated thoracic extension over half foam in chair 2-3 sec hold x 15   ?  ?  Shoulder Exercises: Prone  ? Other Prone Exercises scapular retraction 10 sec hold x 5, prone on elbows SA press 10 sec hold x 5   ? Other Prone Exercises prone horizontal abduction 2 x 15, prone scaption 2 x 15 (tactile cues for scapular movement improvements)   ?  ? Shoulder Exercises: Standing  ? Other Standing Exercises standing flexion to abd to side and reverse 2 lb x15 bilateral, standing blue band er c flexion punch   ? Other Standing Exercises standing y wall slides c lift off x 10 bilateral   ?  ? Shoulder Exercises: ROM/Strengthening  ? UBE (Upper Arm Bike) Lvl 4.0 4 mins fwd/back each way UE only with 10 second faster interval at end of each minute   ? Lat Pull Limitations 3 x 15 20 lbs   ? Cybex Press Limitations Rt arm only 3 x 10 10 lbs   ? Cybex Row Limitations 3 x 15 25 lbs   ?  ? Manual Therapy  ? Manual therapy comments cPA  G4 Mid thoracic T4-T8   ? ?  ?  ? ?  ? ? ? ? ? ? ? ? ? ? ? ? PT Short Term Goals - 03/13/21 1514   ? ?  ? PT SHORT TERM GOAL #1  ? Title Patient will demonstrate independent use of home exercise program to maintain progress from in clinic treatments.   ? Time 3   ? Period Weeks   ? Status Achieved   ? Target Date 03/20/21   ? ?  ?  ? ?  ? ? ? ? PT Long Term Goals - 05/09/21 0001   ? ?  ? PT LONG TERM GOAL #1  ? Title Patient will demonstrate/report pain at worst less than or equal to 2/10 to facilitate minimal limitation in daily activity secondary to pain symptoms.   ? Time 8   ? Period Weeks   ? Status Revised   ? Target Date 06/27/21   ?  ? PT LONG TERM GOAL #2  ? Title Patient will demonstrate independent use of home exercise program to facilitate ability to maintain/progress functional gains from skilled physical therapy services.   ? Time 8   ? Period Weeks   ? Status Revised   ? Target Date 06/27/21   ?  ? PT LONG TERM GOAL #3  ? Title Pt. will demonstrate FOTO outcome > or = 64 % to indicated reduced disability due to condition.   ? Time 8   ? Period Weeks   ? Status Revised   ? Target Date 06/27/21   ?  ? PT LONG TERM GOAL #4  ? Title Patient will demonstrate Rt Stanley joint mobility WFL to facilitate usual self care, dressing, reaching overhead at PLOF s limitation due to symptoms.   ? Time 8   ? Period Weeks   ? Status Revised   ? Target Date 06/27/21   ?  ? PT LONG TERM GOAL #5  ? Title Patient will demonstrate Rt UE MMT 5/5, with 15 % dynamometry of Lt throughout to facilitate usual lifting, carrying in functional activity to PLOF s limitation.   ? Time 8   ? Period Weeks   ? Status Revised   ? Target Date 06/27/21   ?  ? PT LONG TERM GOAL #6  ? Title Pt. will return to work at [employer] s limitation   ? Time 8   ? Period Weeks   ?  Status Revised   ? Target Date 06/27/21   ? ?  ?  ? ?  ? ? ? ? ? ? ? ? Plan - 05/09/21 1449   ? ? Clinical Impression Statement Pt. demonstrated limited scapular mobility/control  paired c thoracic mobility restriction that can impact Rt arm use.   ? Examination-Activity Limitations Reach Overhead;Lift;Hygiene/Grooming;Dressing;Carry;Sleep;Bed Mobility;Bathing   ? Examination-Participation Restrictions Occupation;Meal Prep;Community Activity;Cleaning;Interpersonal Relationship;[employer] Work   ? Stability/Clinical Decision Making Stable/Uncomplicated   ? Rehab Potential Good   ? PT Frequency Other (comment)   2-3x/week  ? PT Duration Other (comment)   10 weeks  ? PT Treatment/Interventions ADLs/Self Care Home Management;Cryotherapy;Electrical Stimulation;Iontophoresis 4mg /ml Dexamethasone;Moist Heat;Balance training;Therapeutic exercise;Therapeutic activities;Functional mobility training;Stair training;Gait training;DME Instruction;Ultrasound;Neuromuscular re-education;Patient/family education;Passive range of motion;Spinal Manipulations;Joint Manipulations;Dry needling;Taping;Vasopneumatic Device;Manual techniques   ? PT Next Visit Plan Improve scapular mobility/strength control.   ? PT Home Exercise Plan [id number]   ? Consulted and Agree with Plan of Care Patient   ? ?  ?  ? ?  ? ? ?Patient will benefit from skilled therapeutic intervention in order to improve the following deficits and impairments:  Decreased endurance, Hypomobility, Decreased activity tolerance, Decreased strength, Impaired UE functional use, Pain, Decreased mobility, Decreased range of motion, Impaired perceived functional ability, Improper body mechanics, Impaired flexibility, Decreased coordination, Increased edema ? ?Visit Diagnosis: ?Chronic right shoulder pain ? ?Muscle weakness (generalized) ? ?Stiffness of right shoulder, not elsewhere classified ? ?Localized edema ? ? ? ? ?Problem List ?Patient Active Problem List  ? Diagnosis Date Noted  ? Unilateral conductive hearing loss 02/26/2021  ? Pre-syncope 02/26/2021  ? Complete tear of right rotator cuff   ? Biceps tendonitis on right   ? Degenerative superior labral  anterior-to-posterior (SLAP) tear of right shoulder   ? Acute pain of right shoulder 01/11/2020  ? Blood pressure check 10/20/2019  ? Mood changes 04/12/2019  ? Acute midline low back pain without sciatica 12/

## 2021-05-14 ENCOUNTER — Encounter: Payer: Self-pay | Admitting: Rehabilitative and Restorative Service Providers"

## 2021-05-14 ENCOUNTER — Ambulatory Visit (INDEPENDENT_AMBULATORY_CARE_PROVIDER_SITE_OTHER): Payer: No Typology Code available for payment source | Admitting: Rehabilitative and Restorative Service Providers"

## 2021-05-14 ENCOUNTER — Other Ambulatory Visit: Payer: Self-pay

## 2021-05-14 DIAGNOSIS — M6281 Muscle weakness (generalized): Secondary | ICD-10-CM

## 2021-05-14 DIAGNOSIS — R600 Localized edema: Secondary | ICD-10-CM

## 2021-05-14 DIAGNOSIS — M25511 Pain in right shoulder: Secondary | ICD-10-CM

## 2021-05-14 DIAGNOSIS — M25611 Stiffness of right shoulder, not elsewhere classified: Secondary | ICD-10-CM | POA: Diagnosis not present

## 2021-05-14 DIAGNOSIS — G8929 Other chronic pain: Secondary | ICD-10-CM

## 2021-05-14 NOTE — Therapy (Signed)
[facility] ?[address] ?[place], [place], [address] ?Phone: [phone]   Fax:  [phone] ? ?Physical Therapy Treatment ? ?Patient Details  ?Name: Tony Nguyen ?MRN: [id number] ?Date of Birth: 02/20/1960 ?Referring Provider (PT): August Saucerean, Corrie MckusickGregory Scott, MD ? ? ?Encounter Date: 05/14/2021 ? ? PT End of Session - 05/14/21 1432   ? ? Visit Number 19   ? Number of Visits 30   ? Date for PT Re-Evaluation 06/27/21   corrected date  ? Authorization Type [employer]   ? PT Start Time 1427   ? PT Stop Time 1506   ? PT Time Calculation (min) 39 min   ? Activity Tolerance Patient tolerated treatment well   ? [facility] tasks assessed/performed   ? ?  ?  ? ?  ? ? ?Past Medical History:  ?Diagnosis Date  ? Bug bite 08/09/2018  ? Duodenal ulcer due to Helicobacter pylori 09/25/2010  ? EGD confirmed that duodenal ulcers and H. pylori gastritis in 08/2009. [facility]   ? Gallbladder polyp 09/25/2010  ? GERD (gastroesophageal reflux disease)   ? Major depression in remission [facility])   ? OTHER DISEASES OF NASAL CAVITY AND SINUSES 02/23/2008  ? Qualifier: Diagnosis of  By: Daphine DeutscherMartin MD, Corrie DandyMary    ? RHINITIS, CHRONIC 04/16/2006  ? Qualifier: Diagnosis of  By: Bebe ShaggyWOODBURY, ANGELICA    ? Screening for prostate cancer 08/12/2019  ? TENNIS ELBOW 04/16/2006  ? Qualifier: Diagnosis of  By: Bebe ShaggyWOODBURY, ANGELICA    ? Tinea corporis 06/01/2017  ? ? ?Past Surgical History:  ?Procedure Laterality Date  ? APPENDECTOMY  1981  ? CHOLECYSTECTOMY  10/15/10  ? Path results revealed chronic cholecystitis  ? ELBOW SURGERY  2005  ? right  ? SHOULDER ARTHROSCOPY WITH SUBACROMIAL DECOMPRESSION, ROTATOR CUFF REPAIR AND BICEP TENDON REPAIR Right 02/05/2021  ? Procedure: RIGHT SHOULDER ARTHROSCOPY, SUBACROMIAL DECOMPRESSION, DEBRIDEMENT, MINI OPEN ROTATOR CUFF TEAR REPAIR, BICEPS TENODESIS;  Surgeon: Cammy Copaean, Gregory Scott, MD;  [facility];  Service: Orthopedics;  Laterality:  Right;  ? UPPER GASTROINTESTINAL ENDOSCOPY    ? ? ?There were no vitals filed for this visit. ? ? Subjective Assessment - 05/14/21 1431   ? ? Subjective Pt indicated he has had some complaints from both shoulders off and on.  Pt indicated that he does feel like he is getting some improvement in movement.  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   ? Limitations House hold activities;Lifting   ? Patient Stated Goals Reduce pain, return to work   ? Currently in Pain? No/denies   no pain at rest  ? Pain Score 0-No pain   ? Pain Onset More than a month ago   ? ?  ?  ? ?  ? ? ? ? ? ? ? ? ? ? ? ? ? ? ? ? ? ? ? ? [facility] Adult PT Treatment/Exercise - 05/14/21 0001   ? ?  ? Shoulder Exercises: Prone  ? Other Prone Exercises prone scapular retraction c GH ext hold 5 sec x 10   ? Other Prone Exercises prone horizontall abduction 1 lb 2 x 15, prone y 2 x 15   ?  ? Shoulder Exercises: Standing  ? Other Standing Exercises horizontal abduction c thoracic rotation x 10 bilateral at wall, standing blue band ER c flexion punch 3 x 10 Rt   ? Other Standing Exercises standing wall push up positioning  lateral green band pulls 3 points each side x 10   ?  ? Shoulder Exercises: ROM/Strengthening  ? UBE (Upper Arm Bike) Lvl 4.5 4 mins fwd/back each way UE only with 15 second faster interval at end of each minute   ?  ? Shoulder Exercises: Stretch  ? Other Shoulder Stretches IR rope stretch 30 sec x 5 at doorway to prevent forward lean   ?  ? Manual Therapy  ? Manual therapy comments cPA G4 Mid thoracic T4-T8, tactile cues for scapular retraction/protraction, upward rotation Rt   ? ?  ?  ? ?  ? ? ? ? ? ? ? ? ? ? ? ? PT Short Term Goals - 03/13/21 1514   ? ?  ? PT SHORT TERM GOAL #1  ? Title Patient will demonstrate independent use of home exercise program to maintain progress from in clinic treatments.   ? Time 3   ? Period Weeks   ? Status Achieved   ? Target Date 03/20/21   ? ?  ?  ? ?  ? ? ? ? PT Long Term  Goals - 05/09/21 0001   ? ?  ? PT LONG TERM GOAL #1  ? Title Patient will demonstrate/report pain at worst less than or equal to 2/10 to facilitate minimal limitation in daily activity secondary to pain symptoms.   ? Time 8   ? Period Weeks   ? Status Revised   ? Target Date 06/27/21   ?  ? PT LONG TERM GOAL #2  ? Title Patient will demonstrate independent use of home exercise program to facilitate ability to maintain/progress functional gains from skilled physical therapy services.   ? Time 8   ? Period Weeks   ? Status Revised   ? Target Date 06/27/21   ?  ? PT LONG TERM GOAL #3  ? Title Pt. will demonstrate FOTO outcome > or = 64 % to indicated reduced disability due to condition.   ? Time 8   ? Period Weeks   ? Status Revised   ? Target Date 06/27/21   ?  ? PT LONG TERM GOAL #4  ? Title Patient will demonstrate Rt GH joint mobility WFL to facilitate usual self care, dressing, reaching overhead at PLOF s limitation due to symptoms.   ? Time 8   ? Period Weeks   ? Status Revised   ? Target Date 06/27/21   ?  ? PT LONG TERM GOAL #5  ? Title Patient will demonstrate Rt UE MMT 5/5, with 15 % dynamometry of Lt throughout to facilitate usual lifting, carrying in functional activity to PLOF s limitation.   ? Time 8   ? Period Weeks   ? Status Revised   ? Target Date 06/27/21   ?  ? PT LONG TERM GOAL #6  ? Title Pt. will return to work at [employer] s limitation   ? Time 8   ? Period Weeks   ? Status Revised   ? Target Date 06/27/21   ? ?  ?  ? ?  ? ? ? ? ? ? ? ? Plan - 05/14/21 1448   ? ? Clinical Impression Statement Slowly improving scapular mobility and thoracic mobility that contributed to improve horizontal abduction and scaption movement today as observed in visit today.  Continued emphasis on progressing these motions c strengthening to Rt shoulder indicated at this time.   ? Examination-Activity Limitations Reach Overhead;Lift;Hygiene/Grooming;Dressing;Carry;Sleep;Bed Mobility;Bathing   ? Examination-Participation  Restrictions Occupation;Meal Prep;Community Activity;Cleaning;Interpersonal Relationship;Pincus Badder Work   ? Stability/Clinical Decision Making Stable/Uncomplicated   ? Rehab Potential Good   ? PT Frequency Other (comment)   2-3x/week  ? PT Duration Other (comment)   10 weeks  ? PT Treatment/Interventions ADLs/Self Care Home Management;Cryotherapy;Electrical Stimulation;Iontophoresis 4mg /ml Dexamethasone;Moist Heat;Balance training;Therapeutic exercise;Therapeutic activities;Functional mobility training;Stair training;Gait training;DME Instruction;Ultrasound;Neuromuscular re-education;Patient/family education;Passive range of motion;Spinal Manipulations;Joint Manipulations;Dry needling;Taping;Vasopneumatic Device;Manual techniques   ? PT Next Visit Plan Improve scapular mobility/strengthenging.   ? PT Home Exercise Plan [id number]   ? Consulted and Agree with Plan of Care Patient   ? ?  ?  ? ?  ? ? ?Patient will benefit from skilled therapeutic intervention in order to improve the following deficits and impairments:  Decreased endurance, Hypomobility, Decreased activity tolerance, Decreased strength, Impaired UE functional use, Pain, Decreased mobility, Decreased range of motion, Impaired perceived functional ability, Improper body mechanics, Impaired flexibility, Decreased coordination, Increased edema ? ?Visit Diagnosis: ?Chronic right shoulder pain ? ?Muscle weakness (generalized) ? ?Stiffness of right shoulder, not elsewhere classified ? ?Localized edema ? ? ? ? ?Problem List ?Patient Active Problem List  ? Diagnosis Date Noted  ? Unilateral conductive hearing loss 02/26/2021  ? Pre-syncope 02/26/2021  ? Complete tear of right rotator cuff   ? Biceps tendonitis on right   ? Degenerative superior labral anterior-to-posterior (SLAP) tear of right shoulder   ? Acute pain of right shoulder 01/11/2020  ? Blood pressure check 10/20/2019  ? Mood changes 04/12/2019  ? Acute midline low back pain without sciatica 02/13/2019  ?  Erectile dysfunction 07/23/2018  ? Ganglion cyst of dorsum of left wrist 04/20/2018  ? Prediabetes 04/17/2016  ? OBESITY, NOS 04/16/2006  ? ? ?04/18/2006, PT ?05/14/2021, 3:08 PM ? ?[place] ?05/16/2021

## 2021-05-15 ENCOUNTER — Telehealth: Payer: Self-pay | Admitting: Orthopedic Surgery

## 2021-05-15 NOTE — Telephone Encounter (Signed)
Received email & vm from Renee Pain with [facility] stating only received partial fax on 03/20/21. Requesting records to be sent again. I emailed to [place] at [email or web address]. ph [phone] ?

## 2021-05-16 ENCOUNTER — Encounter: Payer: Self-pay | Admitting: Rehabilitative and Restorative Service Providers"

## 2021-05-16 ENCOUNTER — Ambulatory Visit (INDEPENDENT_AMBULATORY_CARE_PROVIDER_SITE_OTHER): Payer: No Typology Code available for payment source | Admitting: Rehabilitative and Restorative Service Providers"

## 2021-05-16 ENCOUNTER — Other Ambulatory Visit: Payer: Self-pay

## 2021-05-16 DIAGNOSIS — R600 Localized edema: Secondary | ICD-10-CM

## 2021-05-16 DIAGNOSIS — M25611 Stiffness of right shoulder, not elsewhere classified: Secondary | ICD-10-CM | POA: Diagnosis not present

## 2021-05-16 DIAGNOSIS — M6281 Muscle weakness (generalized): Secondary | ICD-10-CM

## 2021-05-16 DIAGNOSIS — G8929 Other chronic pain: Secondary | ICD-10-CM

## 2021-05-16 DIAGNOSIS — M25511 Pain in right shoulder: Secondary | ICD-10-CM

## 2021-05-16 NOTE — Therapy (Signed)
[place] [facility] ?[address] ?[place], [place], [id number] ?Phone: [phone]   Fax:  [phone] ? ?Physical Therapy Treatment ? ?Patient Details  ?Name: Tony Nguyen ?MRN: [id number] ?Date of Birth: 10/12/1960 ?Referring Provider (PT): August Saucerean, Corrie MckusickGregory Scott, MD ? ? ?Encounter Date: 05/16/2021 ? ? PT End of Session - 05/16/21 1505   ? ? Visit Number 20   ? Number of Visits 30   ? Date for PT Re-Evaluation 06/27/21   corrected date  ? Authorization Type [employer]   ? PT Start Time 1428   ? PT Stop Time 1507   ? PT Time Calculation (min) 39 min   ? Activity Tolerance Patient tolerated treatment well   ? [facility] for tasks assessed/performed   ? ?  ?  ? ?  ? ? ?Past Medical History:  ?Diagnosis Date  ? Bug bite 08/09/2018  ? Duodenal ulcer due to Helicobacter pylori 09/25/2010  ? EGD confirmed that duodenal ulcers and H. pylori gastritis in 08/2009. [facility]   ? Gallbladder polyp 09/25/2010  ? GERD (gastroesophageal reflux disease)   ? Major depression in remission [facility](HCC)   ? OTHER DISEASES OF NASAL CAVITY AND SINUSES 02/23/2008  ? Qualifier: Diagnosis of  By: Daphine DeutscherMartin MD, Corrie DandyMary    ? RHINITIS, CHRONIC 04/16/2006  ? Qualifier: Diagnosis of  By: Bebe ShaggyWOODBURY, ANGELICA    ? Screening for prostate cancer 08/12/2019  ? TENNIS ELBOW 04/16/2006  ? Qualifier: Diagnosis of  By: Bebe ShaggyWOODBURY, ANGELICA    ? Tinea corporis 06/01/2017  ? ? ?Past Surgical History:  ?Procedure Laterality Date  ? APPENDECTOMY  1981  ? CHOLECYSTECTOMY  10/15/10  ? Path results revealed chronic cholecystitis  ? ELBOW SURGERY  2005  ? right  ? SHOULDER ARTHROSCOPY WITH SUBACROMIAL DECOMPRESSION, ROTATOR CUFF REPAIR AND BICEP TENDON REPAIR Right 02/05/2021  ? Procedure: RIGHT SHOULDER ARTHROSCOPY, SUBACROMIAL DECOMPRESSION, DEBRIDEMENT, MINI OPEN ROTATOR CUFF TEAR REPAIR, BICEPS TENODESIS;  Surgeon: Cammy Copaean, Gregory Scott, MD;  Location: [facility];  Service: Orthopedics;  Laterality:  Right;  ? UPPER GASTROINTESTINAL ENDOSCOPY    ? ? ?There were no vitals filed for this visit. ? ? ? ? ? ? [facility] PT Assessment - 05/16/21 0001   ? ?  ? Assessment  ? Medical Diagnosis S46.011D (ICD-10-CM) - Traumatic complete tear of right rotator cuff, subsequent encounter   ? Referring Provider (PT) August Saucerean, Corrie MckusickGregory Scott, MD   ? Onset Date/Surgical Date 02/05/21   ? Hand Dominance Right   ?  ? AROM  ? Overall AROM Comments Hand behind back to PSIS Rt   ?  ? Strength  ? Right Shoulder Flexion 4+/5   ? Right Shoulder ABduction 4+/5   ? ?  ?  ? ?  ? ? ? ? ? ? ? ? ? ? ? ? ? ? ? ? [facility] Adult PT Treatment/Exercise - 05/16/21 0001   ? ?  ? Shoulder Exercises: Supine  ? Other Supine Exercises supine ER/IR in 90 deg c 2 lb medicine ball slow movement x 15 each way (tactile cues to keep arm in correct position)   ? Other Supine Exercises supine horizontal abduction green band 3 x 10   ?  ? Shoulder Exercises: Prone  ? Other Prone Exercises prone scapular retraction c GH ext hold 5 sec x 10, prone scapular retraction c arms in 90/90 type position 5 sec hold x 10   ? Other Prone Exercises prone contralateral arm/leg  lift 5 sec hold x 10 bilateral   ?  ? Shoulder Exercises: Standing  ? Other Standing Exercises standing flexion to abduction to side and reverse x 15 2 lbs   ?  ? Shoulder Exercises: Pulleys  ? Flexion 2 minutes   ? ABduction 2 minutes   ?  ? Shoulder Exercises: ROM/Strengthening  ? Lat Pull Limitations 2 x 15 35 lbs   ? Cybex Row Limitations 2 x 15 45 lbs   ?  ? Shoulder Exercises: Stretch  ? Other Shoulder Stretches sleeper stretch Rt arm 30 sec x 5   ?  ? Manual Therapy  ? Manual therapy comments progressive IR/ER overpressure stretch in 90 deg abduction supine   ? ?  ?  ? ?  ? ? ? ? ? ? ? ? ? ? ? ? PT Short Term Goals - 03/13/21 1514   ? ?  ? PT SHORT TERM GOAL #1  ? Title Patient will demonstrate independent use of home exercise program to maintain progress from in clinic treatments.   ? Time 3   ? Period Weeks    ? Status Achieved   ? Target Date 03/20/21   ? ?  ?  ? ?  ? ? ? ? PT Long Term Goals - 05/09/21 0001   ? ?  ? PT LONG TERM GOAL #1  ? Title Patient will demonstrate/report pain at worst less than or equal to 2/10 to facilitate minimal limitation in daily activity secondary to pain symptoms.   ? Time 8   ? Period Weeks   ? Status Revised   ? Target Date 06/27/21   ?  ? PT LONG TERM GOAL #2  ? Title Patient will demonstrate independent use of home exercise program to facilitate ability to maintain/progress functional gains from skilled physical therapy services.   ? Time 8   ? Period Weeks   ? Status Revised   ? Target Date 06/27/21   ?  ? PT LONG TERM GOAL #3  ? Title Pt. will demonstrate FOTO outcome > or = 64 % to indicated reduced disability due to condition.   ? Time 8   ? Period Weeks   ? Status Revised   ? Target Date 06/27/21   ?  ? PT LONG TERM GOAL #4  ? Title Patient will demonstrate Rt GH joint mobility WFL to facilitate usual self care, dressing, reaching overhead at PLOF s limitation due to symptoms.   ? Time 8   ? Period Weeks   ? Status Revised   ? Target Date 06/27/21   ?  ? PT LONG TERM GOAL #5  ? Title Patient will demonstrate Rt UE MMT 5/5, with 15 % dynamometry of Lt throughout to facilitate usual lifting, carrying in functional activity to PLOF s limitation.   ? Time 8   ? Period Weeks   ? Status Revised   ? Target Date 06/27/21   ?  ? PT LONG TERM GOAL #6  ? Title Pt. will return to work at [employer] s limitation   ? Time 8   ? Period Weeks   ? Status Revised   ? Target Date 06/27/21   ? ?  ?  ? ?  ? ? ? ? ? ? ? ? Plan - 05/16/21 1505   ? ? Clinical Impression Statement Very mild improvements in IR mobility today.  Continued benefit from progressive capsular stretching and posterior scapular strengthening to facilitate improved functional movement.   ?  Examination-Activity Limitations Reach Overhead;Lift;Hygiene/Grooming;Dressing;Carry;Sleep;Bed Mobility;Bathing   ? Examination-Participation  Restrictions Occupation;Meal Prep;Community Activity;Cleaning;Interpersonal Relationship;Pincus Badder Work   ? Stability/Clinical Decision Making Stable/Uncomplicated   ? Rehab Potential Good   ? PT Frequency Other (comment)   2-3x/week  ? PT Duration Other (comment)   10 weeks  ? PT Treatment/Interventions ADLs/Self Care Home Management;Cryotherapy;Electrical Stimulation;Iontophoresis 4mg /ml Dexamethasone;Moist Heat;Balance training;Therapeutic exercise;Therapeutic activities;Functional mobility training;Stair training;Gait training;DME Instruction;Ultrasound;Neuromuscular re-education;Patient/family education;Passive range of motion;Spinal Manipulations;Joint Manipulations;Dry needling;Taping;Vasopneumatic Device;Manual techniques   ? PT Next Visit Plan Improve scapular mobility/strength, capsular stretching ER/IR.   ? PT Home Exercise Plan [id number]   ? Consulted and Agree with Plan of Care Patient   ? ?  ?  ? ?  ? ? ?Patient will benefit from skilled therapeutic intervention in order to improve the following deficits and impairments:  Decreased endurance, Hypomobility, Decreased activity tolerance, Decreased strength, Impaired UE functional use, Pain, Decreased mobility, Decreased range of motion, Impaired perceived functional ability, Improper body mechanics, Impaired flexibility, Decreased coordination, Increased edema ? ?Visit Diagnosis: ?Chronic right shoulder pain ? ?Muscle weakness (generalized) ? ?Stiffness of right shoulder, not elsewhere classified ? ?Localized edema ? ? ? ? ?Problem List ?Patient Active Problem List  ? Diagnosis Date Noted  ? Unilateral conductive hearing loss 02/26/2021  ? Pre-syncope 02/26/2021  ? Complete tear of right rotator cuff   ? Biceps tendonitis on right   ? Degenerative superior labral anterior-to-posterior (SLAP) tear of right shoulder   ? Acute pain of right shoulder 01/11/2020  ? Blood pressure check 10/20/2019  ? Mood changes 04/12/2019  ? Acute midline low back pain without  sciatica 02/13/2019  ? Erectile dysfunction 07/23/2018  ? Ganglion cyst of dorsum of left wrist 04/20/2018  ? Prediabetes 04/17/2016  ? OBESITY, NOS 04/16/2006  ? ? ?04/18/2006, PT, DPT, OCS, ATC ?05/16/21

## 2021-05-24 ENCOUNTER — Other Ambulatory Visit: Payer: Self-pay | Admitting: Otolaryngology

## 2021-05-24 DIAGNOSIS — H9122 Sudden idiopathic hearing loss, left ear: Secondary | ICD-10-CM

## 2021-05-28 ENCOUNTER — Encounter: Payer: No Typology Code available for payment source | Admitting: Rehabilitative and Restorative Service Providers"

## 2021-05-30 ENCOUNTER — Encounter: Payer: Self-pay | Admitting: Rehabilitative and Restorative Service Providers"

## 2021-05-30 ENCOUNTER — Encounter: Payer: No Typology Code available for payment source | Admitting: Rehabilitative and Restorative Service Providers"

## 2021-05-30 ENCOUNTER — Ambulatory Visit (INDEPENDENT_AMBULATORY_CARE_PROVIDER_SITE_OTHER): Payer: No Typology Code available for payment source | Admitting: Rehabilitative and Restorative Service Providers"

## 2021-05-30 DIAGNOSIS — M25611 Stiffness of right shoulder, not elsewhere classified: Secondary | ICD-10-CM

## 2021-05-30 DIAGNOSIS — R600 Localized edema: Secondary | ICD-10-CM

## 2021-05-30 DIAGNOSIS — M25511 Pain in right shoulder: Secondary | ICD-10-CM

## 2021-05-30 DIAGNOSIS — M6281 Muscle weakness (generalized): Secondary | ICD-10-CM | POA: Diagnosis not present

## 2021-05-30 DIAGNOSIS — G8929 Other chronic pain: Secondary | ICD-10-CM

## 2021-05-30 NOTE — Therapy (Signed)
[place] ?[facility] ?[address] ?[place], [place], [address] ?Phone: [phone]   Fax:  [phone] ? ?Physical Therapy Treatment ? ?Patient Details  ?Name: Tony Nguyen ?MRN: [id number] ?Date of Birth: 10/13/1960 ?Referring Provider (PT): Marlou Sa Tonna Corner, MD ? ? ?Encounter Date: 05/30/2021 ? ? PT End of Session - 05/30/21 1429   ? ? Visit Number 21   ? Number of Visits 30   ? Date for PT Re-Evaluation 06/27/21   corrected date  ? Authorization Type [employer]   ? PT Start Time 1428   ? PT Stop Time 1507   ? PT Time Calculation (min) 39 min   ? Activity Tolerance Patient tolerated treatment well   ? Behavior During Therapy [facility] for tasks assessed/performed   ? ?  ?  ? ?  ? ? ?Past Medical History:  ?Diagnosis Date  ? Bug bite 08/09/2018  ? Duodenal ulcer due to Helicobacter pylori [id number]  ? EGD confirmed that duodenal ulcers and H. pylori gastritis in 08/2009. Treated with triple  therapy at [facility].   ? Gallbladder polyp 09/25/2010  ? GERD (gastroesophageal reflux disease)   ? Major depression in remission [facility])   ? OTHER DISEASES OF NASAL CAVITY AND SINUSES 02/23/2008  ? Qualifier: Diagnosis of  By: Hassell Done MD, Stanton Kidney    ? RHINITIS, CHRONIC 04/16/2006  ? Qualifier: Diagnosis of  By: Herma Ard    ? Screening for prostate cancer 08/12/2019  ? TENNIS ELBOW 04/16/2006  ? Qualifier: Diagnosis of  By: Herma Ard    ? Tinea corporis 06/01/2017  ? ? ?Past Surgical History:  ?Procedure Laterality Date  ? APPENDECTOMY  1981  ? CHOLECYSTECTOMY  10/15/10  ? Path results revealed chronic cholecystitis  ? ELBOW SURGERY  2005  ? right  ? SHOULDER ARTHROSCOPY WITH SUBACROMIAL DECOMPRESSION, ROTATOR CUFF REPAIR AND BICEP TENDON REPAIR Right 02/05/2021  ? Procedure: RIGHT SHOULDER ARTHROSCOPY, SUBACROMIAL DECOMPRESSION, DEBRIDEMENT, MINI OPEN ROTATOR CUFF TEAR REPAIR, BICEPS TENODESIS;  Surgeon: Meredith Pel, MD;  Location: [place];  Service: Orthopedics;  Laterality:  Right;  ? UPPER GASTROINTESTINAL ENDOSCOPY    ? ? ?There were no vitals filed for this visit. ? ? Subjective Assessment - 05/30/21 1446   ? ? Subjective Pt indicated he does wake up from sleeping some days with increased tightness/ache.  Otherwise indicated doing ok.   ? Limitations House hold activities;Lifting   ? Patient Stated Goals Reduce pain, return to work   ? Currently in Pain? No/denies   ? Pain Score 0-No pain   ? Pain Onset More than a month ago   ? ?  ?  ? ?  ? ? ? ? ? [facility] PT Assessment - 05/30/21 0001   ? ?  ? Assessment  ? Medical Diagnosis S46.011D (ICD-10-CM) - Traumatic complete tear of right rotator cuff, subsequent encounter   ? Referring Provider (PT) Marlou Sa Tonna Corner, MD   ? Onset Date/Surgical Date 02/05/21   ? Hand Dominance Right   ?  ? Strength  ? Right Shoulder Flexion 5/5   26.8, 28.7 lbs  ? Right Shoulder External Rotation 5/5   16.8, 17.2 lbs  ? ?  ?  ? ?  ? ? ? ? ? ? ? ? ? ? ? ? ? ? ? ? [facility] Adult PT Treatment/Exercise - 05/30/21 0001   ? ?  ? Shoulder Exercises: Supine  ? Protraction Both   5 sec hold, 5 lbs  ? Protraction Weight (lbs) 5   ?  ?  Shoulder Exercises: Standing  ? External Rotation Right   c towel at side 3 x 15  ? Theraband Level (Shoulder External Rotation) Level 4 (Blue)   ? Other Standing Exercises abduction 0-90 degrees bilateral c anterior red band resistance 2 x 10   ? Other Standing Exercises standing bilateral GH ext (high hook) blue band 5 sec hold x 15   ?  ? Shoulder Exercises: Stretch  ? Other Shoulder Stretches sleeper stretch Rt 30 sec x 5, supine cross arm stretch 30 sec x 5   ? ?  ?  ? ?  ? ? ? ? ? ? ? ? ? ? ? ? PT Short Term Goals - 03/13/21 1514   ? ?  ? PT SHORT TERM GOAL #1  ? Title Patient will demonstrate independent use of home exercise program to maintain progress from in clinic treatments.   ? Time 3   ? Period Weeks   ? Status Achieved   ? Target Date 03/20/21   ? ?  ?  ? ?  ? ? ? ? PT Long Term Goals - 05/09/21 0001   ? ?  ? PT LONG TERM  GOAL #1  ? Title Patient will demonstrate/report pain at worst less than or equal to 2/10 to facilitate minimal limitation in daily activity secondary to pain symptoms.   ? Time 8   ? Period Weeks   ? Status Revised   ? Target Date 06/27/21   ?  ? PT LONG TERM GOAL #2  ? Title Patient will demonstrate independent use of home exercise program to facilitate ability to maintain/progress functional gains from skilled physical therapy services.   ? Time 8   ? Period Weeks   ? Status Revised   ? Target Date 06/27/21   ?  ? PT LONG TERM GOAL #3  ? Title Pt. will demonstrate FOTO outcome > or = 64 % to indicated reduced disability due to condition.   ? Time 8   ? Period Weeks   ? Status Revised   ? Target Date 06/27/21   ?  ? PT LONG TERM GOAL #4  ? Title Patient will demonstrate Rt Gruver joint mobility WFL to facilitate usual self care, dressing, reaching overhead at PLOF s limitation due to symptoms.   ? Time 8   ? Period Weeks   ? Status Revised   ? Target Date 06/27/21   ?  ? PT LONG TERM GOAL #5  ? Title Patient will demonstrate Rt UE MMT 5/5, with 15 % dynamometry of Lt throughout to facilitate usual lifting, carrying in functional activity to PLOF s limitation.   ? Time 8   ? Period Weeks   ? Status Revised   ? Target Date 06/27/21   ?  ? PT LONG TERM GOAL #6  ? Title Pt. will return to work at [employer] s limitation   ? Time 8   ? Period Weeks   ? Status Revised   ? Target Date 06/27/21   ? ?  ?  ? ?  ? ? ? ? ? ? ? ? Plan - 05/30/21 1452   ? ? Clinical Impression Statement Noted improvement in dynamometry measurements today, specifically in flexion for Rt shoulder.  Ir mobility still limited in HBB movement (continued review of focus on posterior capsule stretching).   ? Examination-Activity Limitations Reach Overhead;Lift;Hygiene/Grooming;Dressing;Carry;Sleep;Bed Mobility;Bathing   ? Examination-Participation Restrictions Occupation;Meal Prep;Community Activity;Cleaning;Interpersonal Relationship;[employer]alla Leaver Work   ?  Stability/Clinical Decision Making  Stable/Uncomplicated   ? Rehab Potential Good   ? PT Frequency Other (comment)   2-3x/week  ? PT Duration Other (comment)   10 weeks  ? PT Treatment/Interventions ADLs/Self Care Home Management;Cryotherapy;Electrical Stimulation;Iontophoresis 4mg /ml Dexamethasone;Moist Heat;Balance training;Therapeutic exercise;Therapeutic activities;Functional mobility training;Stair training;Gait training;DME Instruction;Ultrasound;Neuromuscular re-education;Patient/family education;Passive range of motion;Spinal Manipulations;Joint Manipulations;Dry needling;Taping;Vasopneumatic Device;Manual techniques   ? PT Next Visit Plan Improve scapular mobility/strength, capsular stretching   ? PT Home Exercise Plan [id number]   ? Consulted and Agree with Plan of Care Patient   ? ?  ?  ? ?  ? ? ?Patient will benefit from skilled therapeutic intervention in order to improve the following deficits and impairments:  Decreased endurance, Hypomobility, Decreased activity tolerance, Decreased strength, Impaired UE functional use, Pain, Decreased mobility, Decreased range of motion, Impaired perceived functional ability, Improper body mechanics, Impaired flexibility, Decreased coordination, Increased edema ? ?Visit Diagnosis: ?Chronic right shoulder pain ? ?Muscle weakness (generalized) ? ?Stiffness of right shoulder, not elsewhere classified ? ?Localized edema ? ? ? ? ?Problem List ?Patient Active Problem List  ? Diagnosis Date Noted  ? Unilateral conductive hearing loss 02/26/2021  ? Pre-syncope 02/26/2021  ? Complete tear of right rotator cuff   ? Biceps tendonitis on right   ? Degenerative superior labral anterior-to-posterior (SLAP) tear of right shoulder   ? Acute pain of right shoulder 01/11/2020  ? Blood pressure check 10/20/2019  ? Mood changes 04/12/2019  ? Acute midline low back pain without sciatica 02/13/2019  ? Erectile dysfunction 07/23/2018  ? Ganglion cyst of dorsum of left wrist 04/20/2018  ?  Prediabetes 04/17/2016  ? OBESITY, NOS 04/16/2006  ? ? ?Scot Jun, PT, DPT, OCS, ATC ?05/30/21  3:04 PM ? ? ? ?[place] [facility] ?[address] ?[place], [place], [address] ?Phone: [phone]

## 2021-05-31 ENCOUNTER — Encounter: Payer: Self-pay | Admitting: Family Medicine

## 2021-05-31 ENCOUNTER — Ambulatory Visit (INDEPENDENT_AMBULATORY_CARE_PROVIDER_SITE_OTHER): Payer: No Typology Code available for payment source | Admitting: Family Medicine

## 2021-05-31 VITALS — BP 118/75 | HR 74 | Temp 98.3°F | Wt 268.0 lb

## 2021-05-31 DIAGNOSIS — Z0000 Encounter for general adult medical examination without abnormal findings: Secondary | ICD-10-CM | POA: Diagnosis not present

## 2021-05-31 DIAGNOSIS — R7989 Other specified abnormal findings of blood chemistry: Secondary | ICD-10-CM

## 2021-05-31 DIAGNOSIS — E7800 Pure hypercholesterolemia, unspecified: Secondary | ICD-10-CM | POA: Diagnosis not present

## 2021-05-31 DIAGNOSIS — R739 Hyperglycemia, unspecified: Secondary | ICD-10-CM | POA: Diagnosis not present

## 2021-05-31 NOTE — Progress Notes (Addendum)
    SUBJECTIVE:   CHIEF COMPLAINT / HPI:   Annual physical: Current concerns - none  Current meds - ibuprofen prn  Tobacco - never  Alcohol- none Drug - none Exercise 2-3 x per week   States he feels safe in his relationship.  PERTINENT  PMH / PSH: History of obesity  OBJECTIVE:   BP 118/75   Pulse 74   Temp 98.3 F (36.8 C)   Wt 268 lb (121.6 kg)   SpO2 97%   BMI 36.35 kg/m    General: NAD, pleasant, able to participate in exam HEENT: No pharyngeal erythema, no cervical lymphadenopathy Cardiac: RRR, no murmurs. Respiratory: CTAB, normal effort, No wheezes, rales or rhonchi Abdomen: Bowel sounds present, nontender, nondistended, no hepatosplenomegaly. Extremities: No lower extremity edema Skin: warm and dry, no rashes noted Neuro: alert, no obvious focal deficits Psych: Normal affect and mood  ASSESSMENT/PLAN:   Annual physical: Patient with no complaints or concerns today.  Blood pressure and vitals are appropriate.  He does not use any tobacco, drugs, alcohol.  He exercises 2-3 times per week.  Has no concerns about safety in his relationships.  He does have a previous set of lab work which showed an AKI at the time of an ER visit as well as some elevated glucose and his previous LDL is elevated.  We will recheck his lipid panel, we will check a BMP to ensure his AKI had resolved, and we will check an A1c as he has not had one done in a few years and has had some elevated glucoses as well as obesity.  Recommended continue work on diet and exercise.  Jackelyn Poling, DO [facility]

## 2021-05-31 NOTE — Patient Instructions (Signed)
We are checking some lab work today and I will let you know the results when they return.  In the meantime continue working on diet and exercise.  If you develop any other concerns please let me know. ?

## 2021-06-01 LAB — LIPID PANEL
Chol/HDL Ratio: 5.7 ratio — ABNORMAL HIGH (ref 0.0–5.0)
Cholesterol, Total: 222 mg/dL — ABNORMAL HIGH (ref 100–199)
HDL: 39 mg/dL — ABNORMAL LOW (ref >39)
LDL Chol Calc (NIH): 166 mg/dL — ABNORMAL HIGH (ref 0–99)
Triglycerides: 93 mg/dL (ref 0–149)
VLDL Cholesterol Cal: 17 mg/dL (ref 5–40)

## 2021-06-01 LAB — BASIC METABOLIC PANEL
BUN/Creatinine Ratio: 11 (ref 9–20)
BUN: 14 mg/dL (ref 6–24)
CO2: 24 mmol/L (ref 20–29)
Calcium: 9.8 mg/dL (ref 8.7–10.2)
Chloride: 103 mmol/L (ref 96–106)
Creatinine, Ser: 1.22 mg/dL (ref 0.76–1.27)
Glucose: 125 mg/dL — ABNORMAL HIGH (ref 70–99)
Potassium: 4.8 mmol/L (ref 3.5–5.2)
Sodium: 141 mmol/L (ref 134–144)
eGFR: 68 mL/min/{1.73_m2} (ref >59)

## 2021-06-01 LAB — HEMOGLOBIN A1C
Est. average glucose Bld gHb Est-mCnc: 143 mg/dL
Hgb A1c MFr Bld: 6.6 % — ABNORMAL HIGH (ref 4.8–5.6)

## 2021-06-04 ENCOUNTER — Other Ambulatory Visit: Payer: Self-pay

## 2021-06-04 ENCOUNTER — Ambulatory Visit (INDEPENDENT_AMBULATORY_CARE_PROVIDER_SITE_OTHER): Payer: No Typology Code available for payment source | Admitting: Rehabilitative and Restorative Service Providers"

## 2021-06-04 ENCOUNTER — Encounter: Payer: Self-pay | Admitting: Rehabilitative and Restorative Service Providers"

## 2021-06-04 DIAGNOSIS — M25511 Pain in right shoulder: Secondary | ICD-10-CM | POA: Diagnosis not present

## 2021-06-04 DIAGNOSIS — R600 Localized edema: Secondary | ICD-10-CM | POA: Diagnosis not present

## 2021-06-04 DIAGNOSIS — M6281 Muscle weakness (generalized): Secondary | ICD-10-CM

## 2021-06-04 DIAGNOSIS — G8929 Other chronic pain: Secondary | ICD-10-CM

## 2021-06-04 DIAGNOSIS — M25611 Stiffness of right shoulder, not elsewhere classified: Secondary | ICD-10-CM

## 2021-06-04 NOTE — Therapy (Signed)
[place] [facility] ?[address] ?[place], [place], [address] ?Phone: [phone]   Fax:  [phone] ? ?Physical Therapy Treatment ? ?Patient Details  ?Name: Tony Nguyen ?MRN: [id number] ?Date of Birth: 1960/03/07 ?Referring Provider (PT): August Saucer Corrie Mckusick, MD ? ? ?Encounter Date: 06/04/2021 ? ? PT End of Session - 06/04/21 1427   ? ? Visit Number 22   ? Number of Visits 30   ? Date for PT Re-Evaluation 06/27/21   corrected date  ? Authorization Type [employer]   ? PT Start Time 1429   ? PT Stop Time 1508   ? PT Time Calculation (min) 39 min   ? Activity Tolerance Patient tolerated treatment well   ? Behavior During Therapy [facility] for tasks assessed/performed   ? ?  ?  ? ?  ? ? ?Past Medical History:  ?Diagnosis Date  ? Bug bite 08/09/2018  ? Duodenal ulcer due to Helicobacter pylori 09/25/2010  ? EGD confirmed that duodenal ulcers and H. pylori gastritis in 08/2009. Treated with triple  therapy at [facility].   ? Gallbladder polyp 09/25/2010  ? GERD (gastroesophageal reflux disease)   ? Major depression in remission [facility])   ? OTHER DISEASES OF NASAL CAVITY AND SINUSES 02/23/2008  ? Qualifier: Diagnosis of  By: Daphine Deutscher MD, Corrie Dandy    ? RHINITIS, CHRONIC 04/16/2006  ? Qualifier: Diagnosis of  By: Bebe Shaggy    ? Screening for prostate cancer 08/12/2019  ? TENNIS ELBOW 04/16/2006  ? Qualifier: Diagnosis of  By: Bebe Shaggy    ? Tinea corporis 06/01/2017  ? ? ?Past Surgical History:  ?Procedure Laterality Date  ? APPENDECTOMY  1981  ? CHOLECYSTECTOMY  10/15/10  ? Path results revealed chronic cholecystitis  ? ELBOW SURGERY  2005  ? right  ? SHOULDER ARTHROSCOPY WITH SUBACROMIAL DECOMPRESSION, ROTATOR CUFF REPAIR AND BICEP TENDON REPAIR Right 02/05/2021  ? Procedure: RIGHT SHOULDER ARTHROSCOPY, SUBACROMIAL DECOMPRESSION, DEBRIDEMENT, MINI OPEN ROTATOR CUFF TEAR REPAIR, BICEPS TENODESIS;  Surgeon: Cammy Copa, MD;  Location: [facility];  Service: Orthopedics;  Laterality:  Right;  ? UPPER GASTROINTESTINAL ENDOSCOPY    ? ? ?There were no vitals filed for this visit. ? ? Subjective Assessment - 06/04/21 1434   ? ? Subjective Pt indicated he still was similar tightness for behind back.  Pt. indicated no pain today upon arrival. He did mention wanting to work towards punching type movement.   ? Limitations House hold activities;Lifting   ? Patient Stated Goals Reduce pain, return to work   ? Currently in Pain? No/denies   ? Pain Score 0-No pain   ? Pain Onset More than a month ago   ? ?  ?  ? ?  ? ? ? ? ? [facility] PT Assessment - 06/04/21 0001   ? ?  ? Assessment  ? Medical Diagnosis S46.011D (ICD-10-CM) - Traumatic complete tear of right rotator cuff, subsequent encounter   ? Referring Provider (PT) August Saucer Corrie Mckusick, MD   ? Onset Date/Surgical Date 02/05/21   ? Hand Dominance Right   ?  ? AROM  ? Overall AROM Comments Hand Behind Back Rt to superior aspect of Rt posterior pelvic rim   ? ?  ?  ? ?  ? ? ? ? ? ? ? ? ? ? ? ? ? ? ? ? [facility] Adult PT Treatment/Exercise - 06/04/21 0001   ? ?  ? Shoulder Exercises: Standing  ? External Rotation Right   2 x 15 slow eccentric  ?  Theraband Level (Shoulder External Rotation) Level 3 (Green)   ? Other Standing Exercises abduction 0-90 degrees bilateral c anterior red band resistance 2 x 10   ? Other Standing Exercises standing punch with blue band attached posterior 2 x 20 bilateral   ?  ? Shoulder Exercises: ROM/Strengthening  ? UBE (Upper Arm Bike) Lvl 3.5 4 mins fwd/back each way c interval 10 seconds at :50-:60 each minute   ? Lat Pull Limitations 2 x 10 45 lbs, x 10 55 lbs   ? Cybex Press Limitations Rt arm only 2 x 15 c SA press hold  10 lbs   ?  ? Shoulder Exercises: Stretch  ? Other Shoulder Stretches posterior capsule cross arm stretch Rt 30 sec x 5   ? ?  ?  ? ?  ? ? ? ? ? ? ? ? ? ? ? ? PT Short Term Goals - 03/13/21 1514   ? ?  ? PT SHORT TERM GOAL #1  ? Title Patient will demonstrate independent use of home exercise program to maintain  progress from in clinic treatments.   ? Time 3   ? Period Weeks   ? Status Achieved   ? Target Date 03/20/21   ? ?  ?  ? ?  ? ? ? ? PT Long Term Goals - 05/09/21 0001   ? ?  ? PT LONG TERM GOAL #1  ? Title Patient will demonstrate/report pain at worst less than or equal to 2/10 to facilitate minimal limitation in daily activity secondary to pain symptoms.   ? Time 8   ? Period Weeks   ? Status Revised   ? Target Date 06/27/21   ?  ? PT LONG TERM GOAL #2  ? Title Patient will demonstrate independent use of home exercise program to facilitate ability to maintain/progress functional gains from skilled physical therapy services.   ? Time 8   ? Period Weeks   ? Status Revised   ? Target Date 06/27/21   ?  ? PT LONG TERM GOAL #3  ? Title Pt. will demonstrate FOTO outcome > or = 64 % to indicated reduced disability due to condition.   ? Time 8   ? Period Weeks   ? Status Revised   ? Target Date 06/27/21   ?  ? PT LONG TERM GOAL #4  ? Title Patient will demonstrate Rt GH joint mobility WFL to facilitate usual self care, dressing, reaching overhead at PLOF s limitation due to symptoms.   ? Time 8   ? Period Weeks   ? Status Revised   ? Target Date 06/27/21   ?  ? PT LONG TERM GOAL #5  ? Title Patient will demonstrate Rt UE MMT 5/5, with 15 % dynamometry of Lt throughout to facilitate usual lifting, carrying in functional activity to PLOF s limitation.   ? Time 8   ? Period Weeks   ? Status Revised   ? Target Date 06/27/21   ?  ? PT LONG TERM GOAL #6  ? Title Pt. will return to work at [employer] s limitation   ? Time 8   ? Period Weeks   ? Status Revised   ? Target Date 06/27/21   ? ?  ?  ? ?  ? ? ? ? ? ? ? ? Plan - 06/04/21 1445   ? ? Clinical Impression Statement Continued review of frequency and consistency for posterior capsular stretching in HEP (continued deficits noted).  Otherwise, daily activity tolerance improving per Pt. report.   ? Examination-Activity Limitations Reach  Overhead;Lift;Hygiene/Grooming;Dressing;Carry;Sleep;Bed Mobility;Bathing   ? Examination-Participation Restrictions Occupation;Meal Prep;Community Activity;Cleaning;Interpersonal Relationship;[employer]   ? Stability/Clinical Decision Making Stable/Uncomplicated   ? Rehab Potential Good   ? PT Frequency Other (comment)   2-3x/week  ? PT Duration Other (comment)   10 weeks  ? PT Treatment/Interventions ADLs/Self Care Home Management;Cryotherapy;Electrical Stimulation;Iontophoresis 4mg /ml Dexamethasone;Moist Heat;Balance training;Therapeutic exercise;Therapeutic activities;Functional mobility training;Stair training;Gait training;DME Instruction;Ultrasound;Neuromuscular re-education;Patient/family education;Passive range of motion;Spinal Manipulations;Joint Manipulations;Dry needling;Taping;Vasopneumatic Device;Manual techniques   ? PT Next Visit Plan Multidirectional strengthening, posterior capsule stretching.   ? PT Home Exercise Plan [id number]   ? Consulted and Agree with Plan of Care Patient   ? ?  ?  ? ?  ? ? ?Patient will benefit from skilled therapeutic intervention in order to improve the following deficits and impairments:  Decreased endurance, Hypomobility, Decreased activity tolerance, Decreased strength, Impaired UE functional use, Pain, Decreased mobility, Decreased range of motion, Impaired perceived functional ability, Improper body mechanics, Impaired flexibility, Decreased coordination, Increased edema ? ?Visit Diagnosis: ?Chronic right shoulder pain ? ?Muscle weakness (generalized) ? ?Stiffness of right shoulder, not elsewhere classified ? ?Localized edema ? ? ? ? ?Problem List ?Patient Active Problem List  ? Diagnosis Date Noted  ? Unilateral conductive hearing loss 02/26/2021  ? Pre-syncope 02/26/2021  ? Complete tear of right rotator cuff   ? Biceps tendonitis on right   ? Degenerative superior labral anterior-to-posterior (SLAP) tear of right shoulder   ? Acute pain of right shoulder 01/11/2020  ?  Blood pressure check 10/20/2019  ? Mood changes 04/12/2019  ? Acute midline low back pain without sciatica 02/13/2019  ? Erectile dysfunction 07/23/2018  ? Ganglion cyst of dorsum of left wrist 04/20/2018  ? Prediabetes 04/17/2016  ? OBESITY, NOS 02/28

## 2021-06-06 ENCOUNTER — Ambulatory Visit (INDEPENDENT_AMBULATORY_CARE_PROVIDER_SITE_OTHER): Payer: No Typology Code available for payment source | Admitting: Rehabilitative and Restorative Service Providers"

## 2021-06-06 DIAGNOSIS — G8929 Other chronic pain: Secondary | ICD-10-CM

## 2021-06-06 DIAGNOSIS — M25611 Stiffness of right shoulder, not elsewhere classified: Secondary | ICD-10-CM

## 2021-06-06 DIAGNOSIS — M6281 Muscle weakness (generalized): Secondary | ICD-10-CM | POA: Diagnosis not present

## 2021-06-06 DIAGNOSIS — R600 Localized edema: Secondary | ICD-10-CM

## 2021-06-06 DIAGNOSIS — M25511 Pain in right shoulder: Secondary | ICD-10-CM | POA: Diagnosis not present

## 2021-06-06 NOTE — Therapy (Signed)
[place] [facility] ?[address] ?[place], [place], [address] ?Phone: [phone]   Fax:  [phone] ? ?Physical Therapy Treatment ? ?Patient Details  ?Name: Tony Nguyen ?MRN: [id number] ?Date of Birth: Jun 23, 1960 ?Referring Provider (PT): Marlou Sa Tonna Corner, MD ? ? ?Encounter Date: 06/06/2021 ? ? PT End of Session - 06/06/21 1431   ? ? Visit Number 23   ? Number of Visits 30   ? Date for PT Re-Evaluation 06/27/21   corrected date  ? Authorization Type [employer]   ? PT Start Time 1426   ? PT Stop Time 1505   ? PT Time Calculation (min) 39 min   ? Activity Tolerance Patient tolerated treatment well   ? Behavior During Therapy [facility] for tasks assessed/performed   ? ?  ?  ? ?  ? ? ?Past Medical History:  ?Diagnosis Date  ? Bug bite 08/09/2018  ? Duodenal ulcer due to Helicobacter pylori [id number]  ? EGD confirmed that duodenal ulcers and H. pylori gastritis in 08/2009. Treated with triple  therapy at [facility].   ? Gallbladder polyp 09/25/2010  ? GERD (gastroesophageal reflux disease)   ? Major depression in remission [facility])   ? OTHER DISEASES OF NASAL CAVITY AND SINUSES 02/23/2008  ? Qualifier: Diagnosis of  By: Hassell Done MD, Stanton Kidney    ? RHINITIS, CHRONIC 04/16/2006  ? Qualifier: Diagnosis of  By: Herma Ard    ? Screening for prostate cancer 08/12/2019  ? TENNIS ELBOW 04/16/2006  ? Qualifier: Diagnosis of  By: Herma Ard    ? Tinea corporis 06/01/2017  ? ? ?Past Surgical History:  ?Procedure Laterality Date  ? APPENDECTOMY  1981  ? CHOLECYSTECTOMY  10/15/10  ? Path results revealed chronic cholecystitis  ? ELBOW SURGERY  2005  ? right  ? SHOULDER ARTHROSCOPY WITH SUBACROMIAL DECOMPRESSION, ROTATOR CUFF REPAIR AND BICEP TENDON REPAIR Right 02/05/2021  ? Procedure: RIGHT SHOULDER ARTHROSCOPY, SUBACROMIAL DECOMPRESSION, DEBRIDEMENT, MINI OPEN ROTATOR CUFF TEAR REPAIR, BICEPS TENODESIS;  Surgeon: Meredith Pel, MD;  Location: [place];  Service: Orthopedics;  Laterality:  Right;  ? UPPER GASTROINTESTINAL ENDOSCOPY    ? ? ?There were no vitals filed for this visit. ? ? Subjective Assessment - 06/06/21 1428   ? ? Subjective Pt reported he had random onset of symptoms in Rt shoulder/upper arm last night.  Pt indicated pain was better today.  Pt indicated pain didn't change with anything specific yesterday.  He reported he tried to rub it.   ? Limitations House hold activities;Lifting   ? Patient Stated Goals Reduce pain, return to work   ? Currently in Pain? No/denies   ? Pain Score 0-No pain   ? Pain Onset More than a month ago   ? ?  ?  ? ?  ? ? ? ? ? [facility] PT Assessment - 06/06/21 0001   ? ?  ? Assessment  ? Medical Diagnosis S46.011D (ICD-10-CM) - Traumatic complete tear of right rotator cuff, subsequent encounter   ? Referring Provider (PT) Marlou Sa Tonna Corner, MD   ? Onset Date/Surgical Date 02/05/21   ? Hand Dominance Right   ?  ? Observation/Other Assessments  ? Focus on Therapeutic Outcomes (FOTO)  update 67%   ? ?  ?  ? ?  ? ? ? ? ? ? ? ? ? ? ? ? ? ? ? ? [facility] Adult PT Treatment/Exercise - 06/06/21 0001   ? ?  ? Shoulder Exercises: Prone  ? Other Prone Exercises UE on  counter c alt shoulder touching 2 x 15 bilateral   ?  ? Shoulder Exercises: Standing  ? External Rotation Right   ? Theraband Level (Shoulder External Rotation) Level 4 (Blue)   2 x 15 c towel at side  ? Other Standing Exercises standing small pball circles in 90 deg flexion 2x 20 cw,ccw, in scaptoin 2x 20 cw, ccw   ? Other Standing Exercises standing cable punch Rt arm 2 x 15 20 lbs bilateral   ?  ? Shoulder Exercises: ROM/Strengthening  ? UBE (Upper Arm Bike) Lvl 4, 4 mins fwd/back each way c interval 10 seconds at :50-:60 each minute   ? Lat Pull Limitations 2 x 15 55 lbs   ? Cybex Press Limitations Rt arm only 2 x 15 c SA press hold  10 lbs   ? Cybex Row Limitations 2 x 15 55 lbs   ? ?  ?  ? ?  ? ? ? ? ? ? ? ? ? ? ? ? PT Short Term Goals - 03/13/21 1514   ? ?  ? PT SHORT TERM GOAL #1  ? Title Patient will  demonstrate independent use of home exercise program to maintain progress from in clinic treatments.   ? Time 3   ? Period Weeks   ? Status Achieved   ? Target Date 03/20/21   ? ?  ?  ? ?  ? ? ? ? PT Long Term Goals - 06/06/21 1451   ? ?  ? PT LONG TERM GOAL #1  ? Title Patient will demonstrate/report pain at worst less than or equal to 2/10 to facilitate minimal limitation in daily activity secondary to pain symptoms.   ? Time 8   ? Period Weeks   ? Status On-going   ? Target Date 06/27/21   ?  ? PT LONG TERM GOAL #2  ? Title Patient will demonstrate independent use of home exercise program to facilitate ability to maintain/progress functional gains from skilled physical therapy services.   ? Time 8   ? Period Weeks   ? Status On-going   ? Target Date 06/27/21   ?  ? PT LONG TERM GOAL #3  ? Title Pt. will demonstrate FOTO outcome > or = 64 % to indicated reduced disability due to condition.   ? Time 8   ? Period Weeks   ? Status Achieved   ? Target Date 06/27/21   ?  ? PT LONG TERM GOAL #4  ? Title Patient will demonstrate Rt [place] joint mobility WFL to facilitate usual self care, dressing, reaching overhead at PLOF s limitation due to symptoms.   ? Time 8   ? Period Weeks   ? Status On-going   ? Target Date 06/27/21   ?  ? PT LONG TERM GOAL #5  ? Title Patient will demonstrate Rt UE MMT 5/5, with 15 % dynamometry of Lt throughout to facilitate usual lifting, carrying in functional activity to PLOF s limitation.   ? Time 8   ? Period Weeks   ? Status On-going   ? Target Date 06/27/21   ?  ? PT LONG TERM GOAL #6  ? Title Pt. will return to work at [employer] s limitation   ? Time 8   ? Period Weeks   ? Status On-going   ? Target Date 06/27/21   ? ?  ?  ? ?  ? ? ? ? ? ? ? ? Plan - 06/06/21 1438   ? ?  Clinical Impression Statement FOTO reassessment showed marked improvement in last 30 days compared to last reassessment.  Pt. has continued to show improvements slow but steadily in strength and functional ability though IR and  posterior scapsule tightness still noted.   ? Examination-Activity Limitations Reach Overhead;Lift;Hygiene/Grooming;Dressing;Carry;Sleep;Bed Mobility;Bathing   ? Examination-Participation Restrictions Occupation;Meal Prep;Community Activity;Cleaning;Interpersonal Relationship;[employer] Work   ? Stability/Clinical Decision Making Stable/Uncomplicated   ? Rehab Potential Good   ? PT Frequency Other (comment)   2-3x/week  ? PT Duration Other (comment)   10 weeks  ? PT Treatment/Interventions ADLs/Self Care Home Management;Cryotherapy;Electrical Stimulation;Iontophoresis 4mg /ml Dexamethasone;Moist Heat;Balance training;Therapeutic exercise;Therapeutic activities;Functional mobility training;Stair training;Gait training;DME Instruction;Ultrasound;Neuromuscular re-education;Patient/family education;Passive range of motion;Spinal Manipulations;Joint Manipulations;Dry needling;Taping;Vasopneumatic Device;Manual techniques   ? PT Next Visit Plan Continued mltidirectional strengthening, posterior capsule stretching.   ? PT Home Exercise Plan [id number]   ? Consulted and Agree with Plan of Care Patient   ? ?  ?  ? ?  ? ? ?Patient will benefit from skilled therapeutic intervention in order to improve the following deficits and impairments:  Decreased endurance, Hypomobility, Decreased activity tolerance, Decreased strength, Impaired UE functional use, Pain, Decreased mobility, Decreased range of motion, Impaired perceived functional ability, Improper body mechanics, Impaired flexibility, Decreased coordination, Increased edema ? ?Visit Diagnosis: ?Chronic right shoulder pain ? ?Muscle weakness (generalized) ? ?Stiffness of right shoulder, not elsewhere classified ? ?Localized edema ? ? ? ? ?Problem List ?Patient Active Problem List  ? Diagnosis Date Noted  ? Unilateral conductive hearing loss 02/26/2021  ? Pre-syncope 02/26/2021  ? Complete tear of right rotator cuff   ? Biceps tendonitis on right   ? Degenerative superior labral  anterior-to-posterior (SLAP) tear of right shoulder   ? Acute pain of right shoulder 01/11/2020  ? Blood pressure check 10/20/2019  ? Mood changes 04/12/2019  ? Acute midline low back pain without sciatica 12/

## 2021-06-11 ENCOUNTER — Ambulatory Visit (INDEPENDENT_AMBULATORY_CARE_PROVIDER_SITE_OTHER): Payer: No Typology Code available for payment source | Admitting: Rehabilitative and Restorative Service Providers"

## 2021-06-11 ENCOUNTER — Other Ambulatory Visit: Payer: Self-pay

## 2021-06-11 ENCOUNTER — Encounter: Payer: Self-pay | Admitting: Rehabilitative and Restorative Service Providers"

## 2021-06-11 DIAGNOSIS — M6281 Muscle weakness (generalized): Secondary | ICD-10-CM

## 2021-06-11 DIAGNOSIS — M25611 Stiffness of right shoulder, not elsewhere classified: Secondary | ICD-10-CM | POA: Diagnosis not present

## 2021-06-11 DIAGNOSIS — M25511 Pain in right shoulder: Secondary | ICD-10-CM

## 2021-06-11 DIAGNOSIS — R600 Localized edema: Secondary | ICD-10-CM | POA: Diagnosis not present

## 2021-06-11 DIAGNOSIS — G8929 Other chronic pain: Secondary | ICD-10-CM

## 2021-06-11 NOTE — Therapy (Signed)
[place] ?[address] ?[place], [place], [address] ?Phone: [phone]   Fax:  [phone] ? ?Physical Therapy Treatment ? ?Patient Details  ?Name: Tony Nguyen ?MRN: [id number] ?Date of Birth: [birth year] ?Referring Provider (PT): Marlou Sa Tonna Corner, MD ? ? ?Encounter Date: 06/11/2021 ? ? PT End of Session - 06/11/21 1437   ? ? Visit Number 24   ? Number of Visits 30   ? Date for PT Re-Evaluation 06/27/21   corrected date  ? Authorization Type [employer]   ? PT Start Time [id number]   ? PT Stop Time [id number]   ? PT Time Calculation (min) 38 min   ? Activity Tolerance Patient tolerated treatment well   ? Behavior During Therapy [facility] for tasks assessed/performed   ? ?  ?  ? ?  ? ? ?Past Medical History:  ?Diagnosis Date  ? Bug bite 08/09/2018  ? Duodenal ulcer due to Helicobacter pylori [id number]  ? EGD confirmed that duodenal ulcers and H. pylori gastritis in 08/2009. Treated with triple  therapy at [place].   ? Gallbladder polyp 09/25/2010  ? GERD (gastroesophageal reflux disease)   ? Major depression in remission [facility])   ? OTHER DISEASES OF NASAL CAVITY AND SINUSES 02/23/2008  ? Qualifier: Diagnosis of  By: Hassell Done MD, Stanton Kidney    ? RHINITIS, CHRONIC 04/16/2006  ? Qualifier: Diagnosis of  By: Herma Ard    ? Screening for prostate cancer 08/12/2019  ? TENNIS ELBOW 04/16/2006  ? Qualifier: Diagnosis of  By: Herma Ard    ? Tinea corporis 06/01/2017  ? ? ?Past Surgical History:  ?Procedure Laterality Date  ? APPENDECTOMY  1981  ? CHOLECYSTECTOMY  10/15/10  ? Path results revealed chronic cholecystitis  ? ELBOW SURGERY  2005  ? right  ? SHOULDER ARTHROSCOPY WITH SUBACROMIAL DECOMPRESSION, ROTATOR CUFF REPAIR AND BICEP TENDON REPAIR Right 02/05/2021  ? Procedure: RIGHT SHOULDER ARTHROSCOPY, SUBACROMIAL DECOMPRESSION, DEBRIDEMENT, MINI OPEN ROTATOR CUFF TEAR REPAIR, BICEPS TENODESIS;  Surgeon: Meredith Pel, MD;  Location: [place];  Service: Orthopedics;  Laterality:  Right;  ? UPPER GASTROINTESTINAL ENDOSCOPY    ? ? ?There were no vitals filed for this visit. ? ? Subjective Assessment - 06/11/21 1436   ? ? Subjective Pt indicated chief complaint of pain at nighttime (up to 8/10) sometimes "maybe because I slept on it." can notice it on both arms.   Pt indicated overall improvement to normal at 75%.   ? Limitations House hold activities;Lifting   ? Patient Stated Goals Reduce pain, return to work   ? Currently in Pain? No/denies   ? Pain Score 0-No pain   ? Pain Onset More than a month ago   ? ?  ?  ? ?  ? ? ? ? ? [facility] PT Assessment - 06/11/21 0001   ? ?  ? Assessment  ? Medical Diagnosis S46.011D (ICD-10-CM) - Traumatic complete tear of right rotator cuff, subsequent encounter   ? Referring Provider (PT) Marlou Sa Tonna Corner, MD   ? Onset Date/Surgical Date 02/05/21   ? Hand Dominance Right   ?  ? Strength  ? Overall Strength Comments Rt middle trap 5/5, lower trap 4/5   ? Right Shoulder ABduction 5/5   20.6, 21.6 lbs  ? Right Shoulder External Rotation 5/5   18, 20 lbs  ? ?  ?  ? ?  ? ? ? ? ? ? ? ? ? ? ? ? ? ? ? ? [facility] Adult PT Treatment/Exercise -  06/11/21 0001   ? ?  ? Shoulder Exercises: Prone  ? Other Prone Exercises prone y, t 2 lbs 2 x 15 each on Rt   ?  ? Shoulder Exercises: Standing  ? Extension Theraband;Both   20 c slow reps , into full extension allowed  ? Theraband Level (Shoulder Extension) Level 4 (Blue)   ? Other Standing Exercises Standing ER c flexion punch Rt arm 20x slow focus   ? Other Standing Exercises Standing shoulder flexion superset 0-90 degrees 2 lbs bilateral (x 10 together, x 10 c contralateral hold in 90 degrees, x 10 together)   ?  ? Shoulder Exercises: ROM/Strengthening  ? UBE (Upper Arm Bike) Lvl 4.3 4 mins fwd/back each way   ?  ? Shoulder Exercises: Stretch  ? Other Shoulder Stretches standing wand GH Ext bilateral 2 sec hold x 15, IR wand behind back bilateral 2 sec hold x 15   ? ?  ?  ? ?  ? ? ? ? ? ? ? ? ? ? ? ? PT Short Term Goals -  03/13/21 1514   ? ?  ? PT SHORT TERM GOAL #1  ? Title Patient will demonstrate independent use of home exercise program to maintain progress from in clinic treatments.   ? Time 3   ? Period Weeks   ? Status Achieved   ? Target Date 03/20/21   ? ?  ?  ? ?  ? ? ? ? PT Long Term Goals - 06/06/21 1451   ? ?  ? PT LONG TERM GOAL #1  ? Title Patient will demonstrate/report pain at worst less than or equal to 2/10 to facilitate minimal limitation in daily activity secondary to pain symptoms.   ? Time 8   ? Period Weeks   ? Status On-going   ? Target Date 06/27/21   ?  ? PT LONG TERM GOAL #2  ? Title Patient will demonstrate independent use of home exercise program to facilitate ability to maintain/progress functional gains from skilled physical therapy services.   ? Time 8   ? Period Weeks   ? Status On-going   ? Target Date 06/27/21   ?  ? PT LONG TERM GOAL #3  ? Title Pt. will demonstrate FOTO outcome > or = 64 % to indicated reduced disability due to condition.   ? Time 8   ? Period Weeks   ? Status Achieved   ? Target Date 06/27/21   ?  ? PT LONG TERM GOAL #4  ? Title Patient will demonstrate Rt [place] joint mobility WFL to facilitate usual self care, dressing, reaching overhead at PLOF s limitation due to symptoms.   ? Time 8   ? Period Weeks   ? Status On-going   ? Target Date 06/27/21   ?  ? PT LONG TERM GOAL #5  ? Title Patient will demonstrate Rt UE MMT 5/5, with 15 % dynamometry of Lt throughout to facilitate usual lifting, carrying in functional activity to PLOF s limitation.   ? Time 8   ? Period Weeks   ? Status On-going   ? Target Date 06/27/21   ?  ? PT LONG TERM GOAL #6  ? Title Pt. will return to work at [employer] s limitation   ? Time 8   ? Period Weeks   ? Status On-going   ? Target Date 06/27/21   ? ?  ?  ? ?  ? ? ? ? ? ? ? ?  Plan - 06/11/21 1448   ? ? Clinical Impression Statement Rt shoulder abduction and ER dynamometry improved compared to prevous assessments.  Continued complaints noted c pressure on  shoulder at night to moderate to higher levels as reported in subjective data but daily levels of pain minimal on most days.  Continue checks of objective data next visit for MD return.   ? Examination-Activity Limitations Reach Overhead;Lift;Hygiene/Grooming;Dressing;Carry;Sleep;Bed Mobility;Bathing   ? Examination-Participation Restrictions Occupation;Meal Prep;Community Activity;Cleaning;Interpersonal Relationship;[employer] Work   ? Stability/Clinical Decision Making Stable/Uncomplicated   ? Rehab Potential Good   ? PT Frequency Other (comment)   2-3x/week  ? PT Duration Other (comment)   10 weeks  ? PT Treatment/Interventions ADLs/Self Care Home Management;Cryotherapy;Electrical Stimulation;Iontophoresis 4mg /ml Dexamethasone;Moist Heat;Balance training;Therapeutic exercise;Therapeutic activities;Functional mobility training;Stair training;Gait training;DME Instruction;Ultrasound;Neuromuscular re-education;Patient/family education;Passive range of motion;Spinal Manipulations;Joint Manipulations;Dry needling;Taping;Vasopneumatic Device;Manual techniques   ? PT Next Visit Plan ROM and further strength testing for progress note for MD visit.   ? PT Home Exercise Plan [email or web address]   ? Consulted and Agree with Plan of Care Patient   ? ?  ?  ? ?  ? ? ?Patient will benefit from skilled therapeutic intervention in order to improve the following deficits and impairments:  Decreased endurance, Hypomobility, Decreased activity tolerance, Decreased strength, Impaired UE functional use, Pain, Decreased mobility, Decreased range of motion, Impaired perceived functional ability, Improper body mechanics, Impaired flexibility, Decreased coordination, Increased edema ? ?Visit Diagnosis: ?Chronic right shoulder pain ? ?Muscle weakness (generalized) ? ?Stiffness of right shoulder, not elsewhere classified ? ?Localized edema ? ? ? ? ?Problem List ?Patient Active Problem List  ? Diagnosis Date Noted  ? Unilateral conductive hearing loss  02/26/2021  ? Pre-syncope 02/26/2021  ? Complete tear of right rotator cuff   ? Biceps tendonitis on right   ? Degenerative superior labral anterior-to-posterior (SLAP) tear of right shoulder   ? Acute pain of right sho

## 2021-06-13 ENCOUNTER — Ambulatory Visit (INDEPENDENT_AMBULATORY_CARE_PROVIDER_SITE_OTHER): Payer: No Typology Code available for payment source | Admitting: Rehabilitative and Restorative Service Providers"

## 2021-06-13 ENCOUNTER — Encounter: Payer: Self-pay | Admitting: Rehabilitative and Restorative Service Providers"

## 2021-06-13 ENCOUNTER — Other Ambulatory Visit: Payer: Self-pay

## 2021-06-13 DIAGNOSIS — M25611 Stiffness of right shoulder, not elsewhere classified: Secondary | ICD-10-CM

## 2021-06-13 DIAGNOSIS — R600 Localized edema: Secondary | ICD-10-CM

## 2021-06-13 DIAGNOSIS — M6281 Muscle weakness (generalized): Secondary | ICD-10-CM | POA: Diagnosis not present

## 2021-06-13 DIAGNOSIS — G8929 Other chronic pain: Secondary | ICD-10-CM

## 2021-06-13 DIAGNOSIS — M25511 Pain in right shoulder: Secondary | ICD-10-CM | POA: Diagnosis not present

## 2021-06-13 NOTE — Therapy (Signed)
[place] [facility] ?[address] ?[place], [place], [address] ?Phone: [phone]   Fax:  [phone] ? ?Physical Therapy Treatment /Progress Note ? ?Patient Details  ?Name: Tony Nguyen ?MRN: [id number] ?Date of Birth: Feb 22, 1960 ?Referring Provider (PT): Marlou Sa Tonna Corner, MD ? ?Progress Note ?Reporting Period 05/02/2021 to 06/13/2021 ? ?See note below for Objective Data and Assessment of Progress/Goals.  ? ? ? ?Encounter Date: 06/13/2021 ? ? PT End of Session - 06/13/21 1433   ? ? Visit Number 25   ? Number of Visits 30   ? Date for PT Re-Evaluation 06/27/21   corrected date  ? Authorization Type [employer]   ? PT Start Time 1428   ? PT Stop Time [id number]   ? PT Time Calculation (min) 30 min   ? Activity Tolerance Patient tolerated treatment well   ? Behavior During Therapy [facility] for tasks assessed/performed   ? ?  ?  ? ?  ? ? ?Past Medical History:  ?Diagnosis Date  ? Bug bite 08/09/2018  ? Duodenal ulcer due to Helicobacter pylori 87/68/1157  ? EGD confirmed that duodenal ulcers and H. pylori gastritis in 08/2009. Treated with triple  therapy at [place].   ? Gallbladder polyp 09/25/2010  ? GERD (gastroesophageal reflux disease)   ? Major depression in remission [facility])   ? OTHER DISEASES OF NASAL CAVITY AND SINUSES 02/23/2008  ? Qualifier: Diagnosis of  By: Hassell Done MD, Stanton Kidney    ? RHINITIS, CHRONIC 04/16/2006  ? Qualifier: Diagnosis of  By: Herma Ard    ? Screening for prostate cancer 08/12/2019  ? TENNIS ELBOW 04/16/2006  ? Qualifier: Diagnosis of  By: Herma Ard    ? Tinea corporis 06/01/2017  ? ? ?Past Surgical History:  ?Procedure Laterality Date  ? APPENDECTOMY  1981  ? CHOLECYSTECTOMY  10/15/10  ? Path results revealed chronic cholecystitis  ? ELBOW SURGERY  2005  ? right  ? SHOULDER ARTHROSCOPY WITH SUBACROMIAL DECOMPRESSION, ROTATOR CUFF REPAIR AND BICEP TENDON REPAIR Right 02/05/2021  ? Procedure: RIGHT SHOULDER ARTHROSCOPY, SUBACROMIAL DECOMPRESSION,  DEBRIDEMENT, MINI OPEN ROTATOR CUFF TEAR REPAIR, BICEPS TENODESIS;  Surgeon: Meredith Pel, MD;  Location: [place];  Service: Orthopedics;  Laterality: Right;  ? UPPER GASTROINTESTINAL ENDOSCOPY    ? ? ?There were no vitals filed for this visit. ? ? Subjective Assessment - 06/13/21 1433   ? ? Subjective Pt indicated no new news or changes in symptoms since last visit.  Pt. previously reported improvement at 75% to normal.   ? Limitations House hold activities;Lifting   ? Patient Stated Goals Reduce pain, return to work   ? Currently in Pain? No/denies   ? Pain Score 0-No pain   ? Pain Onset More than a month ago   ? ?  ?  ? ?  ? ? ? ? ? [facility] PT Assessment - 06/13/21 0001   ? ?  ? Assessment  ? Medical Diagnosis S46.011D (ICD-10-CM) - Traumatic complete tear of right rotator cuff, subsequent encounter   ? Referring Provider (PT) Marlou Sa Tonna Corner, MD   ? Onset Date/Surgical Date 02/05/21   ? Hand Dominance Right   ?  ? Observation/Other Assessments  ? Focus on Therapeutic Outcomes (FOTO)  FOTO update from 06/06/2021: 67%   ?  ? AROM  ? Overall AROM Comments Hand Behind Back Rt to superior aspect of Rt L3   ? Right Shoulder Flexion 151 Degrees   supine  ? Right Shoulder ABduction 124 Degrees  supine  ? Right Shoulder Internal Rotation 48 Degrees   supine in 45 deg abduction  ? Right Shoulder External Rotation 66 Degrees   supine in 45 deg abduction  ?  ? Strength  ? Overall Strength Comments Rt middle trap 5/5, lower trap 4/5   ? Right Shoulder Flexion 5/5   25, 26 lbs  ? Right Shoulder ABduction 5/5   ? Right Shoulder External Rotation 5/5   ? ?  ?  ? ?  ? ? ? ? ? ? ? ? ? ? ? ? ? ? ? ? ? ? ? ? ? ? ? ? ? PT Education - 06/13/21 1456   ? ? Education Details HEP   ? Person(s) Educated Patient   ? Methods Explanation;Demonstration;Verbal cues;Handout   ? Comprehension Verbalized understanding;Returned demonstration   ? ?  ?  ? ?  ? ? ? PT Short Term Goals - 03/13/21 1514   ? ?  ? PT SHORT TERM GOAL #1  ? Title  Patient will demonstrate independent use of home exercise program to maintain progress from in clinic treatments.   ? Time 3   ? Period Weeks   ? Status Achieved   ? Target Date 03/20/21   ? ?  ?  ? ?  ? ? ? ? PT Long Term Goals - 06/13/21 1459   ? ?  ? PT LONG TERM GOAL #1  ? Title Patient will demonstrate/report pain at worst less than or equal to 2/10 to facilitate minimal limitation in daily activity secondary to pain symptoms.   ? Time 8   ? Period Weeks   ? Status Partially Met   ? Target Date 06/27/21   ?  ? PT LONG TERM GOAL #2  ? Title Patient will demonstrate independent use of home exercise program to facilitate ability to maintain/progress functional gains from skilled physical therapy services.   ? Time 8   ? Period Weeks   ? Status Achieved   ? Target Date 06/27/21   ?  ? PT LONG TERM GOAL #3  ? Title Pt. will demonstrate FOTO outcome > or = 64 % to indicated reduced disability due to condition.   ? Time 8   ? Period Weeks   ? Status Achieved   ? Target Date 06/27/21   ?  ? PT LONG TERM GOAL #4  ? Title Patient will demonstrate Rt GH joint mobility WFL to facilitate usual self care, dressing, reaching overhead at PLOF s limitation due to symptoms.   ? Time 8   ? Period Weeks   ? Status Partially Met   ? Target Date 06/27/21   ?  ? PT LONG TERM GOAL #5  ? Title Patient will demonstrate Rt UE MMT 5/5, with 15 % dynamometry of Lt throughout to facilitate usual lifting, carrying in functional activity to PLOF s limitation.   ? Time 8   ? Period Weeks   ? Status Partially Met   ? Target Date 06/27/21   ?  ? PT LONG TERM GOAL #6  ? Title Pt. will return to work at PLOF s limitation   ? Time 8   ? Period Weeks   ? Status On-going   ? Target Date 06/27/21   ? ?  ?  ? ?  ? ? ? ? ? ? ? ? Plan - 06/13/21 1434   ? ? Clinical Impression Statement Pt. has attended 25 visits overall to this point, reporting overall   improvement to 75% at this time.  Pt. indicated mild pains at most during day with some increased pain  at night sometimes insidiously.  See objective data for updated information regarding current presentation.  Pt has demonstrated noted improvement in strength and overall mobility compared to previous data collections.  Most evident mobility limitation involves IR/HBB movement.  Pt to continue to benefit from continued use of HEP to address remaining mobility and strength deficits.  At this time, Pt was appropriat for trial period of HEP only with plan to d/c formally after 30 days inactivity.  May return if worsened and recert or referral(> 30 days inactivity) will be performed.   ? Examination-Activity Limitations Reach Overhead;Lift;Hygiene/Grooming;Dressing;Carry;Sleep;Bed Mobility;Bathing   ? Examination-Participation Restrictions Occupation;Meal Prep;Community Activity;Cleaning;Interpersonal Relationship;[employer] Work   ? Stability/Clinical Decision Making Stable/Uncomplicated   ? Rehab Potential Good   ? PT Frequency Other (comment)   2-3x/week  ? PT Duration Other (comment)   10 weeks  ? PT Treatment/Interventions ADLs/Self Care Home Management;Cryotherapy;Electrical Stimulation;Iontophoresis 13m/ml Dexamethasone;Moist Heat;Balance training;Therapeutic exercise;Therapeutic activities;Functional mobility training;Stair training;Gait training;DME Instruction;Ultrasound;Neuromuscular re-education;Patient/family education;Passive range of motion;Spinal Manipulations;Joint Manipulations;Dry needling;Taping;Vasopneumatic Device;Manual techniques   ? PT Next Visit Plan Trial HEP period.  Recert if returning.   ? PT Home Exercise Plan [email or web address]   ? Consulted and Agree with Plan of Care Patient   ? ?  ?  ? ?  ? ? ?Patient will benefit from skilled therapeutic intervention in order to improve the following deficits and impairments:  Decreased endurance, Hypomobility, Decreased activity tolerance, Decreased strength, Impaired UE functional use, Pain, Decreased mobility, Decreased range of motion, Impaired perceived  functional ability, Improper body mechanics, Impaired flexibility, Decreased coordination, Increased edema ? ?Visit Diagnosis: ?Chronic right shoulder pain ? ?Muscle weakness (generalized) ? ?Stiffness of right shoulder,

## 2021-06-13 NOTE — Patient Instructions (Signed)
Access Code: [email or web address] ?URL: [email or web address] ?Date: 06/13/2021 ?Prepared by: Chyrel Masson ? ?Exercises ?- Corner Pec Major Stretch  - 2-3 x daily - 7 x weekly - 1 sets - 5 reps - 30 hold ?- Standing Shoulder Flexion to 90 Degrees with Dumbbells  - 1-2 x daily - 7 x weekly - 2-3 sets - 10 reps ?- Standing Shoulder Posterior Capsule Stretch (Mirrored)  - 2-3 x daily - 7 x weekly - 1 sets - 5 reps - 15-30 hold ?- Engineer, building services (Mirrored)  - 2-3 x daily - 7 x weekly - 1 sets - 5 reps - 30 hold ?- Standing Shoulder Internal Rotation Stretch with Towel  - 2-3 x daily - 7 x weekly - 1 sets - 5 reps - 30 hold ?- Shoulder External Rotation with Anchored Resistance (Mirrored)  - 2 x daily - 7 x weekly - 3 sets - 10 reps ?- Prone Single Arm Shoulder Y (Mirrored)  - 1 x daily - 7 x weekly - 3 sets - 10-15 reps ?- Prone Shoulder Horizontal Abduction  - 1 x daily - 7 x weekly - 3 sets - 10-15 reps ?- Prone Scapular Slide with Shoulder Extension  - 1 x daily - 7 x weekly - 1 sets - 10 reps - 5 hold ?

## 2021-06-17 ENCOUNTER — Ambulatory Visit (INDEPENDENT_AMBULATORY_CARE_PROVIDER_SITE_OTHER): Payer: No Typology Code available for payment source | Admitting: Orthopedic Surgery

## 2021-06-17 DIAGNOSIS — Z9889 Other specified postprocedural states: Secondary | ICD-10-CM

## 2021-06-23 ENCOUNTER — Encounter: Payer: Self-pay | Admitting: Orthopedic Surgery

## 2021-06-23 NOTE — Progress Notes (Signed)
? ?Office Visit Note ?  ?Patient: Tony Nguyen           ?Date of Birth: 11/29/1960           ?MRN: [id number] ?Visit Date: 06/17/2021 ?Requested by: Lyndee Hensen, DO ?[place] [address] ?[place],  [place] [address] ?PCP: Lyndee Hensen, DO ? ?Subjective: ?Chief Complaint  ?Patient presents with  ? Right Shoulder - Routine Post Op  ? ? ?HPI: Patient presents for follow-up of right shoulder arthroscopy with biceps tenodesis and mini open rotator cuff tear repair.  Underwent procedure 02/05/2021.  Is having some occasional pain at night.  Taking ibuprofen.  Workmen's Comp. denied his claim.  He has to lift boxes around 25 to 30 pounds.  His injury occurred at work when he was pinned between 2 areas and a broken pallet was falling back towards him and he had to hold that up with both arms but primarily the right arm.  He was moving a box of labels and then the palate broke and started to tilt towards him.  He has no more physical therapy scheduled.  He is doing gym work on his own. ?             ?ROS: All systems reviewed are negative as they relate to the chief complaint within the history of present illness.  Patient denies  fevers or chills. ? ? ?Assessment & Plan: ?Visit Diagnoses:  ?1. Status post rotator cuff repair   ? ? ?Plan: Impression is patient is doing reasonly well following right shoulder arthroscopy with biceps tenodesis and mini open rotator cuff tear repair.  I think he should be okay to return to regular duty May 15.  He is going to work on a little bit more strengthening as well as overhead motion and strengthening over the next 2 weeks.  6-week return for final recheck and release. ? ?Follow-Up Instructions: Return in about 6 weeks (around 07/29/2021).  ? ?Orders:  ?No orders of the defined types were placed in this encounter. ? ?No orders of the defined types were placed in this encounter. ? ? ? ? Procedures: ?No procedures performed ? ? ?Clinical Data: ?No additional  findings. ? ?Objective: ?Vital Signs: There were no vitals taken for this visit. ? ?Physical Exam:  ? ?Constitutional: Patient appears well-developed ?HEENT:  ?Head: Normocephalic ?Eyes:EOM are normal ?Neck: Normal range of motion ?Cardiovascular: Normal rate ?Pulmonary/chest: Effort normal ?Neurologic: Patient is alert ?Skin: Skin is warm ?Psychiatric: Patient has normal mood and affect ? ? ?Ortho Exam: Ortho exam does demonstrate slightly diminished rotator cuff strength on the right compared to the left.  Passive range of motion on the right is 45/105/160.  No coarse grinding or crepitus with internal/external rotation of the arm at 15 and 90 degrees of abduction.  Incision intact.  No AC joint tenderness is present.  Biceps contour closely symmetric right versus left ? ?Specialty Comments:  ?No specialty comments available. ? ?Imaging: ?No results found. ? ? ?PMFS History: ?Patient Active Problem List  ? Diagnosis Date Noted  ? Unilateral conductive hearing loss 02/26/2021  ? Pre-syncope 02/26/2021  ? Complete tear of right rotator cuff   ? Biceps tendonitis on right   ? Degenerative superior labral anterior-to-posterior (SLAP) tear of right shoulder   ? Acute pain of right shoulder 01/11/2020  ? Blood pressure check 10/20/2019  ? Mood changes 04/12/2019  ? Acute midline low back pain without sciatica 02/13/2019  ? Erectile dysfunction 07/23/2018  ? Ganglion  cyst of dorsum of left wrist 04/20/2018  ? Prediabetes 04/17/2016  ? OBESITY, NOS 04/16/2006  ? ?Past Medical History:  ?Diagnosis Date  ? Bug bite 08/09/2018  ? Duodenal ulcer due to Helicobacter pylori [id number]  ? EGD confirmed that duodenal ulcers and H. pylori gastritis in 08/2009. Treated with triple  therapy at [place].   ? Gallbladder polyp 09/25/2010  ? GERD (gastroesophageal reflux disease)   ? Major depression in remission [facility])   ? OTHER DISEASES OF NASAL CAVITY AND SINUSES 02/23/2008  ? Qualifier: Diagnosis of  By: Hassell Done MD, Stanton Kidney    ?  RHINITIS, CHRONIC 04/16/2006  ? Qualifier: Diagnosis of  By: Herma Ard    ? Screening for prostate cancer 08/12/2019  ? TENNIS ELBOW 04/16/2006  ? Qualifier: Diagnosis of  By: Herma Ard    ? Tinea corporis 06/01/2017  ?  ?Family History  ?Problem Relation Age of Onset  ? High blood pressure Father   ? Kidney disease Cousin   ? Diabetes Neg Hx   ? Early death Neg Hx   ? Heart disease Neg Hx   ? Hyperlipidemia Neg Hx   ? Hypertension Neg Hx   ?  ?Past Surgical History:  ?Procedure Laterality Date  ? APPENDECTOMY  1981  ? CHOLECYSTECTOMY  10/15/10  ? Path results revealed chronic cholecystitis  ? ELBOW SURGERY  2005  ? right  ? SHOULDER ARTHROSCOPY WITH SUBACROMIAL DECOMPRESSION, ROTATOR CUFF REPAIR AND BICEP TENDON REPAIR Right 02/05/2021  ? Procedure: RIGHT SHOULDER ARTHROSCOPY, SUBACROMIAL DECOMPRESSION, DEBRIDEMENT, MINI OPEN ROTATOR CUFF TEAR REPAIR, BICEPS TENODESIS;  Surgeon: Meredith Pel, MD;  Location: [place];  Service: Orthopedics;  Laterality: Right;  ? UPPER GASTROINTESTINAL ENDOSCOPY    ? ?Social History  ? ?Occupational History  ? Occupation: Glass blower/designer   ?  Employer: [employer]  ?  Comment: [employer]  ?Tobacco Use  ? Smoking status: Never  ? Smokeless tobacco: Never  ?Vaping Use  ? Vaping Use: Never used  ?Substance and Sexual Activity  ? Alcohol use: No  ? Drug use: No  ? Sexual activity: Yes  ? ? ? ? ? ?

## 2021-06-24 ENCOUNTER — Ambulatory Visit
Admission: RE | Admit: 2021-06-24 | Discharge: 2021-06-24 | Disposition: A | Discharge status: Home / Self Care | Payer: No Typology Code available for payment source | Source: Ambulatory Visit | Attending: Otolaryngology | Admitting: Otolaryngology

## 2021-06-24 DIAGNOSIS — H9122 Sudden idiopathic hearing loss, left ear: Secondary | ICD-10-CM

## 2021-06-24 MED ORDER — GADOBENATE DIMEGLUMINE 529 MG/ML IV SOLN
20.0000 mL | Freq: Once | INTRAVENOUS | Status: AC | PRN
  Administered 2021-06-24: 20 mL via INTRAVENOUS

## 2021-06-28 ENCOUNTER — Encounter: Payer: No Typology Code available for payment source | Admitting: Rehabilitative and Restorative Service Providers"

## 2021-07-01 ENCOUNTER — Ambulatory Visit (INDEPENDENT_AMBULATORY_CARE_PROVIDER_SITE_OTHER): Payer: No Typology Code available for payment source | Admitting: Rehabilitative and Restorative Service Providers"

## 2021-07-01 ENCOUNTER — Encounter: Payer: Self-pay | Admitting: Rehabilitative and Restorative Service Providers"

## 2021-07-01 DIAGNOSIS — R600 Localized edema: Secondary | ICD-10-CM

## 2021-07-01 DIAGNOSIS — M25511 Pain in right shoulder: Secondary | ICD-10-CM

## 2021-07-01 DIAGNOSIS — G8929 Other chronic pain: Secondary | ICD-10-CM

## 2021-07-01 DIAGNOSIS — M6281 Muscle weakness (generalized): Secondary | ICD-10-CM | POA: Diagnosis not present

## 2021-07-01 DIAGNOSIS — M25611 Stiffness of right shoulder, not elsewhere classified: Secondary | ICD-10-CM

## 2021-07-01 NOTE — Therapy (Signed)
[facility] ?[address] ?[place], [place], [address] ?Phone: [phone]   Fax:  [phone] ? ?Physical Therapy Treatment/ Recertification ? ?Patient Details  ?Name: Tony Nguyen ?MRN: [id number] ?Date of Birth: [birth year] ?Referring Provider (PT): August Saucer Corrie Mckusick, MD ? ?Progress Note ?Reporting Period 06/13/2021 to 07/01/2021 ? ?See note below for Objective Data and Assessment of Progress/Goals.  ? ? ? ?Encounter Date: 07/01/2021 ? ? PT End of Session - 07/01/21 1423   ? ? Visit Number 26   ? Number of Visits 33   ? Date for PT Re-Evaluation 08/26/21   corrected date  ? Authorization Type [employer]   ? PT Start Time 1425   ? PT Stop Time 1505   ? PT Time Calculation (min) 40 min   ? Activity Tolerance Patient tolerated treatment well   ? Behavior During Therapy [facility] for tasks assessed/performed   ? ?  ?  ? ?  ? ? ?Past Medical History:  ?Diagnosis Date  ? Bug bite 08/09/2018  ? Duodenal ulcer due to Helicobacter pylori 09/25/2010  ? EGD confirmed that duodenal ulcers and H. pylori gastritis in 08/2009. Treated with triple  therapy at [facility].   ? Gallbladder polyp 09/25/2010  ? GERD (gastroesophageal reflux disease)   ? Major depression in remission [facility])   ? OTHER DISEASES OF NASAL CAVITY AND SINUSES 02/23/2008  ? Qualifier: Diagnosis of  By: Daphine Deutscher MD, Corrie Dandy    ? RHINITIS, CHRONIC 04/16/2006  ? Qualifier: Diagnosis of  By: Bebe Shaggy    ? Screening for prostate cancer 08/12/2019  ? TENNIS ELBOW 04/16/2006  ? Qualifier: Diagnosis of  By: Bebe Shaggy    ? Tinea corporis 06/01/2017  ? ? ?Past Surgical History:  ?Procedure Laterality Date  ? APPENDECTOMY  1981  ? CHOLECYSTECTOMY  10/15/10  ? Path results revealed chronic cholecystitis  ? ELBOW SURGERY  2005  ? right  ? SHOULDER ARTHROSCOPY WITH SUBACROMIAL DECOMPRESSION, ROTATOR CUFF REPAIR AND BICEP TENDON REPAIR Right 02/05/2021  ? Procedure: RIGHT SHOULDER ARTHROSCOPY, SUBACROMIAL DECOMPRESSION,  DEBRIDEMENT, MINI OPEN ROTATOR CUFF TEAR REPAIR, BICEPS TENODESIS;  Surgeon: Cammy Copa, MD;  Location: [facility] OR;  Service: Orthopedics;  Laterality: Right;  ? UPPER GASTROINTESTINAL ENDOSCOPY    ? ? ?There were no vitals filed for this visit. ? ? Subjective Assessment - 07/01/21 1429   ? ? Subjective Pt indicated having complaints at night in both shoulders.  Rated symptoms 7/10 requiring getting up and sitting up helps.  Has not returned to work and doesn't feel like he can lift like he would at work.   ? Limitations House hold activities;Lifting   ? Patient Stated Goals Reduce pain, return to work   ? Currently in Pain? No/denies   ? Pain Score 0-No pain   7/10 at worst  ? Pain Location Shoulder   ? Pain Orientation Left;Right   ? Pain Descriptors / Indicators Aching;Sore   ? Pain Type Chronic pain   ? Pain Onset More than a month ago   ? Pain Frequency Intermittent   ? Aggravating Factors  nighttime   ? Pain Relieving Factors sitting up   ? ?  ?  ? ?  ? ? ? ? ? [facility] PT Assessment - 07/01/21 0001   ? ?  ? Assessment  ? Medical Diagnosis S46.011D (ICD-10-CM) - Traumatic complete tear of right rotator cuff, subsequent encounter   ? Referring Provider (PT) August Saucer Corrie Mckusick, MD   ? Onset Date/Surgical Date  02/05/21   ? Hand Dominance Right   ?  ? AROM  ? Right Shoulder Flexion 150 Degrees   in supine  ? Right Shoulder ABduction 142 Degrees   in supine  ? Right Shoulder Internal Rotation 55 Degrees   supine in 45 deg abduction  ? Right Shoulder External Rotation 68 Degrees   supine in 45 deg abduction  ?  ? Strength  ? Right Shoulder Flexion 5/5   25.7, 25.9 lbs  ? Right Shoulder ABduction 5/5   24.7, 24.8 lbs c mild pain  ? Right Shoulder External Rotation 5/5   18.1, 18 lbs c mild pain  ? Left Shoulder Flexion 5/5   36.4, 33.8 lbs  ? Left Shoulder ABduction 5/5   26.2, 25.3 lbs  ? Left Shoulder External Rotation 5/5   25.5, 24.3 lbs  ? ?  ?  ? ?  ? ? ? ? ? ? ? ? ? ? ? ? ? ? ? ? [facility] Adult PT  Treatment/Exercise - 07/01/21 0001   ? ?  ? Shoulder Exercises: Standing  ? External Rotation Right   eccentric only Rt arm 2 x 15 c arm at side  ? Theraband Level (Shoulder External Rotation) Level 4 (Blue)   ? Other Standing Exercises lunge at wall horizontal abduction c thoracic rotation green band x 15 bilateral, standing 2 lb ball circles in 90 deg flexion at wall 30 x 2 cw, ccw Rt   ? Other Standing Exercises Standing bilateral curl c overhead press 4 lbs x 10, 5 lbs x 10, standing wall push up c SA press 2 x 15   ?  ? Shoulder Exercises: Pulleys  ? Flexion 3 minutes   ? ABduction 3 minutes   ? ?  ?  ? ?  ? ? ? ? ? ? ? ? ? ? ? ? PT Short Term Goals - 03/13/21 1514   ? ?  ? PT SHORT TERM GOAL #1  ? Title Patient will demonstrate independent use of home exercise program to maintain progress from in clinic treatments.   ? Time 3   ? Period Weeks   ? Status Achieved   ? Target Date 03/20/21   ? ?  ?  ? ?  ? ? ? ? PT Long Term Goals - 07/01/21 1445   ? ?  ? PT LONG TERM GOAL #1  ? Title Patient will demonstrate/report pain at worst less than or equal to 2/10 to facilitate minimal limitation in daily activity secondary to pain symptoms.   ? Time 8   ? Period Weeks   ? Status Revised   ? Target Date 08/26/21   ?  ? PT LONG TERM GOAL #2  ? Title Patient will demonstrate independent use of home exercise program to facilitate ability to maintain/progress functional gains from skilled physical therapy services.   ? Time 8   ? Period Weeks   ? Status Revised   ? Target Date 08/26/21   ?  ? PT LONG TERM GOAL #3  ? Title Pt. will demonstrate FOTO outcome > or = 64 % to indicated reduced disability due to condition.   ? Time 8   ? Period Weeks   ? Status Achieved   ? Target Date 06/27/21   ?  ? PT LONG TERM GOAL #4  ? Title Patient will demonstrate Rt GH joint mobility WFL to facilitate usual self care, dressing, reaching overhead at PLOF s limitation due to symptoms.   ?  Time 8   ? Period Weeks   ? Status Revised   ? Target  Date 08/26/21   ?  ? PT LONG TERM GOAL #5  ? Title Patient will demonstrate Rt UE  with 10% dynamometry of Lt throughout to facilitate usual lifting, carrying in functional activity to PLOF s limitation.   ? Time 8   ? Period Weeks   ? Status Revised   ? Target Date 08/26/21   ?  ? PT LONG TERM GOAL #6  ? Title Pt. will return to work at [employer] s limitation   ? Time 8   ? Period Weeks   ? Status Revised   ? Target Date 08/26/21   ? ?  ?  ? ?  ? ? ? ? ? ? ? ? Plan - 07/01/21 1434   ? ? Clinical Impression Statement Pt returned to clinic today after most recent MD follow up visit.  MD note indicated desire for continued strengthening to facilitate return to work activity.  Patient is a 61 y.o. who comes to clinic with complaints of Rt shoulder (and Lt) pain with mobility, strength deficits that impair their ability to perform usual daily and recreational functional activities without increase difficulty/symptoms at this time.  Main pain complaints noted at nighttime.  Work requires lifting 20-30 lbs up to shoulder height. Patient to benefit from skilled PT services to address impairments and limitations to improve to previous level of function without restriction secondary to condition.   ? Examination-Activity Limitations Reach Overhead;Lift;Hygiene/Grooming;Dressing;Carry;Sleep;Bed Mobility;Bathing   ? Examination-Participation Restrictions Occupation;Meal Prep;Community Activity;Cleaning;Interpersonal Relationship;Pincus Badder Work   ? Stability/Clinical Decision Making Stable/Uncomplicated   ? Clinical Decision Making Low   ? Rehab Potential Good   ? PT Frequency 1x / week   ? PT Duration 8 weeks   ? PT Treatment/Interventions ADLs/Self Care Home Management;Cryotherapy;Electrical Stimulation;Iontophoresis 4mg /ml Dexamethasone;Moist Heat;Balance training;Therapeutic exercise;Therapeutic activities;Functional mobility training;Stair training;Gait training;DME Instruction;Ultrasound;Neuromuscular re-education;Patient/family  education;Passive range of motion;Spinal Manipulations;Joint Manipulations;Dry needling;Taping;Vasopneumatic Device;Manual techniques   ? PT Next Visit Plan Progressive strengthening against gravity   ? PT Home Exercise Pl

## 2021-07-03 ENCOUNTER — Encounter: Payer: No Typology Code available for payment source | Admitting: Rehabilitative and Restorative Service Providers"

## 2021-07-08 ENCOUNTER — Encounter: Payer: No Typology Code available for payment source | Admitting: Rehabilitative and Restorative Service Providers"

## 2021-07-10 ENCOUNTER — Encounter: Payer: Self-pay | Admitting: Rehabilitative and Restorative Service Providers"

## 2021-07-10 ENCOUNTER — Ambulatory Visit (INDEPENDENT_AMBULATORY_CARE_PROVIDER_SITE_OTHER): Payer: No Typology Code available for payment source | Admitting: Rehabilitative and Restorative Service Providers"

## 2021-07-10 DIAGNOSIS — R600 Localized edema: Secondary | ICD-10-CM

## 2021-07-10 DIAGNOSIS — M25511 Pain in right shoulder: Secondary | ICD-10-CM | POA: Diagnosis not present

## 2021-07-10 DIAGNOSIS — M6281 Muscle weakness (generalized): Secondary | ICD-10-CM | POA: Diagnosis not present

## 2021-07-10 DIAGNOSIS — M25611 Stiffness of right shoulder, not elsewhere classified: Secondary | ICD-10-CM | POA: Diagnosis not present

## 2021-07-10 DIAGNOSIS — G8929 Other chronic pain: Secondary | ICD-10-CM

## 2021-07-10 NOTE — Therapy (Signed)
[facility] [address] [place], [place], [address] Phone: [phone]   Fax:  [phone]  Physical Therapy Treatment  Patient Details  Name: Tony Nguyen MRN: [id number] Date of Birth: 1960-09-15 Referring Provider (PT): Marlou Sa Tonna Corner, MD   Encounter Date: 07/10/2021   PT End of Session - 07/10/21 1430     Visit Number 27    Number of Visits 33    Date for PT Re-Evaluation 08/26/21   corrected date   Authorization Type [employer]    PT Start Time 1422    PT Stop Time 1502    PT Time Calculation (min) 40 min    Activity Tolerance Patient tolerated treatment well    Behavior During Therapy [facility] for tasks assessed/performed             Past Medical History:  Diagnosis Date   Bug bite 08/09/2018   Duodenal ulcer due to Helicobacter pylori [id number]   EGD confirmed that duodenal ulcers and H. pylori gastritis in 08/2009. Treated with triple  therapy at [facility].    Gallbladder polyp 09/25/2010   GERD (gastroesophageal reflux disease)    Major depression in remission ([place])    OTHER DISEASES OF NASAL CAVITY AND SINUSES 02/23/2008   Qualifier: Diagnosis of  By: Hassell Done MD, [place], CHRONIC 04/16/2006   Qualifier: Diagnosis of  By: Herma Ard     Screening for prostate cancer 08/12/2019   TENNIS ELBOW 04/16/2006   Qualifier: Diagnosis of  By: Herma Ard     Tinea corporis 06/01/2017    Past Surgical History:  Procedure Laterality Date   APPENDECTOMY  1981   CHOLECYSTECTOMY  10/15/10   Path results revealed chronic cholecystitis   ELBOW SURGERY  2005   right   SHOULDER ARTHROSCOPY WITH SUBACROMIAL DECOMPRESSION, ROTATOR CUFF REPAIR AND BICEP TENDON REPAIR Right 02/05/2021   Procedure: RIGHT SHOULDER ARTHROSCOPY, SUBACROMIAL DECOMPRESSION, DEBRIDEMENT, MINI OPEN ROTATOR CUFF TEAR REPAIR, BICEPS TENODESIS;  Surgeon: Meredith Pel, MD;  Location: [facility];  Service: Orthopedics;  Laterality:  Right;   UPPER GASTROINTESTINAL ENDOSCOPY      There were no vitals filed for this visit.   Subjective Assessment - 07/10/21 1424     Subjective Pt indicated feeling complaints in both shoulders, Lt > Rt after driving.  INdicated similar nighttime complaints but during the day good (except for the driving).    Limitations House hold activities;Lifting    Patient Stated Goals Reduce pain, return to work    Currently in Pain? No/denies    Pain Score 4     Pain Location Shoulder    Pain Orientation Right    Pain Descriptors / Indicators Sore;Aching    Pain Type Chronic pain    Pain Onset More than a month ago    Pain Frequency Intermittent    Aggravating Factors  driving today, nighttime    Pain Relieving Factors nothing specific for driving so far today                [facility] PT Assessment - 07/10/21 0001       Assessment   Medical Diagnosis S46.011D (ICD-10-CM) - Traumatic complete tear of right rotator cuff, subsequent encounter    Referring Provider (PT) Marlou Sa Tonna Corner, MD    Onset Date/Surgical Date 02/05/21    Hand Dominance Right      Strength   Overall Strength Comments Rt lower trap 5/5  [facility] Adult PT Treatment/Exercise - 07/10/21 0001       Shoulder Exercises: Prone   Other Prone Exercises prone scapular retraction GH ext hold 5 sec x 10 bilateral, prone horizontal abduction lift off table 5 sec hold x 10 Rt, prone scaption lower trap lift off 5 sec hold x 10 Rt      Shoulder Exercises: Sidelying   Other Sidelying Exercises reactive plyoball 2 lbs ER catches 30 sec x 5 Rt      Shoulder Exercises: Standing   Other Standing Exercises lunge at wall horizontal abduction c thoracic rotation green band x 15 bilateral, standing green band ER hold c foam roller slide up wall c SA press 2 x 10    Other Standing Exercises Standing at wall arms 100 deg flexion band pulls 3 points bilaterally x 10 each      Shoulder  Exercises: Pulleys   Flexion 2 minutes      Shoulder Exercises: ROM/Strengthening   UBE (Upper Arm Bike) Lvl 2 3 mins fwd/back each way UE only post strengthening for ROM      Manual Therapy   Manual therapy comments percussive device to infrapsinatus bilateral                       PT Short Term Goals - 03/13/21 1514       PT SHORT TERM GOAL #1   Title Patient will demonstrate independent use of home exercise program to maintain progress from in clinic treatments.    Time 3    Period Weeks    Status Achieved    Target Date 03/20/21               PT Long Term Goals - 07/01/21 1445       PT LONG TERM GOAL #1   Title Patient will demonstrate/report pain at worst less than or equal to 2/10 to facilitate minimal limitation in daily activity secondary to pain symptoms.    Time 8    Period Weeks    Status Revised    Target Date 08/26/21      PT LONG TERM GOAL #2   Title Patient will demonstrate independent use of home exercise program to facilitate ability to maintain/progress functional gains from skilled physical therapy services.    Time 8    Period Weeks    Status Revised    Target Date 08/26/21      PT LONG TERM GOAL #3   Title Pt. will demonstrate FOTO outcome > or = 64 % to indicated reduced disability due to condition.    Time 8    Period Weeks    Status Achieved    Target Date 06/27/21      PT LONG TERM GOAL #4   Title Patient will demonstrate Rt [place] joint mobility WFL to facilitate usual self care, dressing, reaching overhead at PLOF s limitation due to symptoms.    Time 8    Period Weeks    Status Revised    Target Date 08/26/21      PT LONG TERM GOAL #5   Title Patient will demonstrate Rt UE  with 10% dynamometry of Lt throughout to facilitate usual lifting, carrying in functional activity to PLOF s limitation.    Time 8    Period Weeks    Status Revised    Target Date 08/26/21      PT LONG TERM GOAL #6   Title Pt. will return to  work at [employer] s limitation    Time 8    Period Weeks    Status Revised    Target Date 08/26/21                   Plan - 07/10/21 1456     Clinical Impression Statement Pt arrived c symptoms in both arms, Lt > Rt due to driving activity for several hours.  Tenderness and trigger points in posterior musculature (infraspinatus, supraspinatus) noted today.  Manual treatment helped relax area.  Continued progressive strengthening towards end range movements.  Fatigue noted in activity.    Examination-Activity Limitations Reach Overhead;Lift;Hygiene/Grooming;Dressing;Carry;Sleep;Bed Mobility;Bathing    Examination-Participation Restrictions Occupation;Meal Prep;Community Activity;Cleaning;Interpersonal Relationship;Yard Work    Stability/Clinical Decision Making Stable/Uncomplicated    Rehab Potential Good    PT Frequency 1x / week    PT Duration 8 weeks    PT Treatment/Interventions ADLs/Self Care Home Management;Cryotherapy;Electrical Stimulation;Iontophoresis 4mg /ml Dexamethasone;Moist Heat;Balance training;Therapeutic exercise;Therapeutic activities;Functional mobility training;Stair training;Gait training;DME Instruction;Ultrasound;Neuromuscular re-education;Patient/family education;Passive range of motion;Spinal Manipulations;Joint Manipulations;Dry needling;Taping;Vasopneumatic Device;Manual techniques    PT Next Visit Plan Progressive strengthening against gravity as tolerated to improve functional lifting.    PT Home Exercise Plan [id number]    Consulted and Agree with Plan of Care Patient             Patient will benefit from skilled therapeutic intervention in order to improve the following deficits and impairments:  Decreased endurance, Hypomobility, Decreased activity tolerance, Decreased strength, Impaired UE functional use, Pain, Decreased mobility, Decreased range of motion, Impaired perceived functional ability, Improper body mechanics, Impaired flexibility, Decreased  coordination, Increased edema  Visit Diagnosis: Chronic right shoulder pain  Muscle weakness (generalized)  Stiffness of right shoulder, not elsewhere classified  Localized edema     Problem List Patient Active Problem List   Diagnosis Date Noted   Unilateral conductive hearing loss 02/26/2021   Pre-syncope 02/26/2021   Complete tear of right rotator cuff    Biceps tendonitis on right    Degenerative superior labral anterior-to-posterior (SLAP) tear of right shoulder    Acute pain of right shoulder 01/11/2020   Blood pressure check 10/20/2019   Mood changes 04/12/2019   Acute midline low back pain without sciatica 02/13/2019   Erectile dysfunction 07/23/2018   Ganglion cyst of dorsum of left wrist 04/20/2018   Prediabetes 04/17/2016   OBESITY, NOS 04/16/2006    Scot Jun, PT, DPT, OCS, ATC 07/10/21  3:00 PM    [facility] [address] [place], [place], [address] Phone: [phone]   Fax:  [phone]  Name: Tony Nguyen MRN: [id number] Date of Birth: 16-Aug-1960

## 2021-07-23 ENCOUNTER — Encounter: Payer: Self-pay | Admitting: *Deleted

## 2021-07-24 ENCOUNTER — Encounter: Payer: Self-pay | Admitting: Rehabilitative and Restorative Service Providers"

## 2021-07-24 ENCOUNTER — Ambulatory Visit (INDEPENDENT_AMBULATORY_CARE_PROVIDER_SITE_OTHER): Payer: No Typology Code available for payment source | Admitting: Rehabilitative and Restorative Service Providers"

## 2021-07-24 DIAGNOSIS — R600 Localized edema: Secondary | ICD-10-CM

## 2021-07-24 DIAGNOSIS — G8929 Other chronic pain: Secondary | ICD-10-CM

## 2021-07-24 DIAGNOSIS — M25511 Pain in right shoulder: Secondary | ICD-10-CM

## 2021-07-24 DIAGNOSIS — M6281 Muscle weakness (generalized): Secondary | ICD-10-CM

## 2021-07-24 DIAGNOSIS — M25611 Stiffness of right shoulder, not elsewhere classified: Secondary | ICD-10-CM | POA: Diagnosis not present

## 2021-07-24 NOTE — Therapy (Addendum)
[facility] [address] [place], [place], [address] Phone: [phone]   Fax:  [phone]  Physical Therapy Treatment /Discharge  Patient Details  Name: Tony Nguyen MRN: [id number] Date of Birth: [birth year] Referring Provider (PT): Marlou Sa Tonna Corner, MD   Encounter Date: 07/24/2021   PT End of Session - 07/24/21 1510     Visit Number 28    Number of Visits 33    Date for PT Re-Evaluation 08/26/21   corrected date   Authorization Type [employer]    PT Start Time 1431    PT Stop Time 1510    PT Time Calculation (min) 39 min    Activity Tolerance Patient tolerated treatment well    Behavior During Therapy [facility] for tasks assessed/performed             Past Medical History:  Diagnosis Date   Bug bite 08/09/2018   Duodenal ulcer due to Helicobacter pylori 50/35/4656   EGD confirmed that duodenal ulcers and H. pylori gastritis in 08/2009. Treated with triple  therapy at [place].    Gallbladder polyp 09/25/2010   GERD (gastroesophageal reflux disease)    Major depression in remission ([place])    OTHER DISEASES OF NASAL CAVITY AND SINUSES 02/23/2008   Qualifier: Diagnosis of  By: Hassell Done MD, [place], CHRONIC 04/16/2006   Qualifier: Diagnosis of  By: Herma Ard     Screening for prostate cancer 08/12/2019   TENNIS ELBOW 04/16/2006   Qualifier: Diagnosis of  By: Herma Ard     Tinea corporis 06/01/2017    Past Surgical History:  Procedure Laterality Date   APPENDECTOMY  1981   CHOLECYSTECTOMY  10/15/10   Path results revealed chronic cholecystitis   ELBOW SURGERY  2005   right   SHOULDER ARTHROSCOPY WITH SUBACROMIAL DECOMPRESSION, ROTATOR CUFF REPAIR AND BICEP TENDON REPAIR Right 02/05/2021   Procedure: RIGHT SHOULDER ARTHROSCOPY, SUBACROMIAL DECOMPRESSION, DEBRIDEMENT, MINI OPEN ROTATOR CUFF TEAR REPAIR, BICEPS TENODESIS;  Surgeon: Meredith Pel, MD;  Location: [place];  Service: Orthopedics;   Laterality: Right;   UPPER GASTROINTESTINAL ENDOSCOPY      There were no vitals filed for this visit.   Subjective Assessment - 07/24/21 1433     Subjective Pt indicated feeling pain to severe in both arms related to nightime and driving but also during the day.  Pt indicated no specific incident leading to increase.  Pt indicated feeling some pain c lifting arms out to side.    Limitations House hold activities;Lifting    Patient Stated Goals Reduce pain, return to work    Currently in Pain? Yes    Pain Score 7     Pain Location Shoulder    Pain Orientation Left;Right   Lt more than Rt   Pain Descriptors / Indicators Aching;Sore;Sharp    Pain Type Chronic pain    Pain Onset More than a month ago    Pain Frequency Intermittent    Aggravating Factors  driving, nighttime, lifitng arms to side.    Pain Relieving Factors ice                [facility] PT Assessment - 07/24/21 0001       Assessment   Medical Diagnosis S46.011D (ICD-10-CM) - Traumatic complete tear of right rotator cuff, subsequent encounter    Referring Provider (PT) Marlou Sa Tonna Corner, MD    Onset Date/Surgical Date 02/05/21    Hand Dominance Right      Strength  Right Shoulder Flexion 4+/5   c pain   Right Shoulder ABduction 5/5    Right Shoulder External Rotation 4/5   c mild pain   Left Shoulder Flexion 5/5    Left Shoulder ABduction 5/5    Left Shoulder External Rotation 5/5                           [facility] Adult PT Treatment/Exercise - 07/24/21 0001       Shoulder Exercises: Standing   Extension Both;Theraband   3 x 10 c towel at side eccentric only focus     Shoulder Exercises: Pulleys   Flexion 3 minutes    ABduction 3 minutes      Shoulder Exercises: ROM/Strengthening   UBE (Upper Arm Bike) Lvl 4 4 mins fwd/back each way      Manual Therapy   Manual therapy comments percussive device to infrapsinatus bilateral                       PT Short Term Goals -  03/13/21 1514       PT SHORT TERM GOAL #1   Title Patient will demonstrate independent use of home exercise program to maintain progress from in clinic treatments.    Time 3    Period Weeks    Status Achieved    Target Date 03/20/21               PT Long Term Goals - 07/24/21 1444       PT LONG TERM GOAL #1   Title Patient will demonstrate/report pain at worst less than or equal to 2/10 to facilitate minimal limitation in daily activity secondary to pain symptoms.    Time 8    Period Weeks    Status On-going    Target Date 08/26/21      PT LONG TERM GOAL #2   Title Patient will demonstrate independent use of home exercise program to facilitate ability to maintain/progress functional gains from skilled physical therapy services.    Time 8    Period Weeks    Status Achieved    Target Date 08/26/21      PT LONG TERM GOAL #3   Title Pt. will demonstrate FOTO outcome > or = 64 % to indicated reduced disability due to condition.    Time 8    Period Weeks    Status Achieved    Target Date 06/27/21      PT LONG TERM GOAL #4   Title Patient will demonstrate Rt [place] joint mobility WFL to facilitate usual self care, dressing, reaching overhead at PLOF s limitation due to symptoms.    Time 8    Period Weeks    Status On-going    Target Date 08/26/21      PT LONG TERM GOAL #5   Title Patient will demonstrate Rt UE  with 10% dynamometry of Lt throughout to facilitate usual lifting, carrying in functional activity to PLOF s limitation.    Time 8    Period Weeks    Status On-going    Target Date 08/26/21      PT LONG TERM GOAL #6   Title Pt. will return to work at [employer] s limitation    Time 8    Period Weeks    Status On-going    Target Date 08/26/21  Plan - 07/24/21 1444     Clinical Impression Statement Pt has continued complaints of insidious onset of symptoms at night (both shoulders) as well as with prolonged driving.  Symptoms have also  been reported with more severity overall as well.  Objectively, Pt continued to show overall improvements in mobility and strength in most areas and has progressed in intervention to higher level strengthening for functional use.  At this time, Pt has good understanding of HEP progressions.  At this time, Pt may benefit from continued HEP and return to MD for pain follow up.    Examination-Activity Limitations Reach Overhead;Lift;Hygiene/Grooming;Dressing;Carry;Sleep;Bed Mobility;Bathing    Examination-Participation Restrictions Occupation;Meal Prep;Community Activity;Cleaning;Interpersonal Relationship;Yard Work    Stability/Clinical Decision Making Stable/Uncomplicated    Rehab Potential Good    PT Frequency 1x / week    PT Duration 8 weeks    PT Treatment/Interventions ADLs/Self Care Home Management;Cryotherapy;Electrical Stimulation;Iontophoresis 4mg /ml Dexamethasone;Moist Heat;Balance training;Therapeutic exercise;Therapeutic activities;Functional mobility training;Stair training;Gait training;DME Instruction;Ultrasound;Neuromuscular re-education;Patient/family education;Passive range of motion;Spinal Manipulations;Joint Manipulations;Dry needling;Taping;Vasopneumatic Device;Manual techniques    PT Next Visit Plan Returning to MD    PT Katie and Agree with Plan of Care Patient             Patient will benefit from skilled therapeutic intervention in order to improve the following deficits and impairments:  Decreased endurance, Hypomobility, Decreased activity tolerance, Decreased strength, Impaired UE functional use, Pain, Decreased mobility, Decreased range of motion, Impaired perceived functional ability, Improper body mechanics, Impaired flexibility, Decreased coordination, Increased edema  Visit Diagnosis: Chronic right shoulder pain  Muscle weakness (generalized)  Stiffness of right shoulder, not elsewhere classified  Localized  edema     Problem List Patient Active Problem List   Diagnosis Date Noted   Unilateral conductive hearing loss 02/26/2021   Pre-syncope 02/26/2021   Complete tear of right rotator cuff    Biceps tendonitis on right    Degenerative superior labral anterior-to-posterior (SLAP) tear of right shoulder    Acute pain of right shoulder 01/11/2020   Blood pressure check 10/20/2019   Mood changes 04/12/2019   Acute midline low back pain without sciatica 02/13/2019   Erectile dysfunction 07/23/2018   Ganglion cyst of dorsum of left wrist 04/20/2018   Prediabetes 04/17/2016   OBESITY, NOS 04/16/2006    Scot Jun, PT, DPT, OCS, ATC 07/24/21  3:12 PM  PHYSICAL THERAPY DISCHARGE SUMMARY  Visits from Start of Care: 28  Current functional level related to goals / functional outcomes: See note   Remaining deficits: See note   Education / Equipment: HEP   Patient agrees to discharge. Patient goals were partially met. Patient is being discharged due to maximized rehab potential.   Scot Jun, PT, DPT, OCS, ATC 08/02/21  11:05 AM     [facility] [address] [place], [place], [address] Phone: [phone]   Fax:  [phone]  Name: Tony Nguyen MRN: [id number] Date of Birth: 1960-09-05

## 2021-07-26 ENCOUNTER — Ambulatory Visit (INDEPENDENT_AMBULATORY_CARE_PROVIDER_SITE_OTHER): Payer: No Typology Code available for payment source | Admitting: Surgical

## 2021-07-26 ENCOUNTER — Ambulatory Visit: Payer: Self-pay

## 2021-07-26 DIAGNOSIS — R200 Anesthesia of skin: Secondary | ICD-10-CM

## 2021-07-26 DIAGNOSIS — M19012 Primary osteoarthritis, left shoulder: Secondary | ICD-10-CM | POA: Diagnosis not present

## 2021-07-26 DIAGNOSIS — Z9889 Other specified postprocedural states: Secondary | ICD-10-CM

## 2021-07-26 DIAGNOSIS — M19011 Primary osteoarthritis, right shoulder: Secondary | ICD-10-CM | POA: Diagnosis not present

## 2021-07-28 ENCOUNTER — Encounter: Payer: Self-pay | Admitting: Orthopedic Surgery

## 2021-07-28 MED ORDER — METHYLPREDNISOLONE ACETATE 40 MG/ML IJ SUSP
13.3300 mg | INTRAMUSCULAR | Status: AC | PRN
  Administered 2021-07-26: 13.33 mg via INTRA_ARTICULAR

## 2021-07-28 MED ORDER — LIDOCAINE HCL 1 % IJ SOLN
3.0000 mL | INTRAMUSCULAR | Status: AC | PRN
  Administered 2021-07-26: 3 mL

## 2021-07-28 MED ORDER — BUPIVACAINE HCL 0.25 % IJ SOLN
0.6600 mL | INTRAMUSCULAR | Status: AC | PRN
  Administered 2021-07-26: .66 mL via INTRA_ARTICULAR

## 2021-07-28 NOTE — Progress Notes (Signed)
Office Visit Note   Patient: Tony Nguyen           Date of Birth: 09/22/1960           MRN: [id number] Visit Date: 07/26/2021 Requested by: Tony Cabal, DO [address] [place],  [place] [address] PCP: Tony Cabal, DO  Subjective: Chief Complaint  Patient presents with   Right Shoulder - Follow-up    HPI: Tony Nguyen is a 61 y.o. male who presents to the office complaining of bilateral shoulder pain and bilateral arm numbness/tingling.  Patient complains of of numbness and tingling primarily in the ulnar nerve distribution in both hands that has been bothering him for several months.  He notices this especially when he lays on his stomach with his head in his hands watching TV.  He has not had any prior elbow surgery for ulnar neuropathy.  No significant neck pain or radicular pain down the entire arm.  No numbness and tingling in the other fingers.  Additionally, he complains of bilateral shoulder pain in the left shoulder more so than the right.  He states that most of the pain is in the superior aspect of the shoulder and is worse with holding his arms overhead for long peers of time.  He does have history of prior right shoulder rotator cuff repair in December 2022 and states that this is doing well and he does not feel any weakness in the arm.  He is looking for another line of work and is planning to hopefully open a [employer]                ROS: All systems reviewed are negative as they relate to the chief complaint within the history of present illness.  Patient denies fevers or chills.  Assessment & Plan: Visit Diagnoses:  1. Status post rotator cuff repair   2. Bilateral hand numbness     Plan: Patient is a 61 year old male who presents following right shoulder rotator cuff repair in December 2022.  His right shoulder has done well and he has excellent range of motion on exam with 5/5 motor strength of supra, infra, subscap today.  He  has a different type of pain that is mostly giving him trouble with the shoulders, primarily localizing to the acromioclavicular joint in both shoulders.  Left shoulder is actually bothering him more than the right.  Discussed options and he would like to try injection.  Ultrasound-guided injection successfully delivered into the left [facility] joint.  Patient tolerated procedure well and had good relief of his tenderness on exam during the anesthetic portion of the exam.  He will return if he would like to try right AC joint injection in the future.  Also plan to order bilateral upper extremity nerve conduction study for further evaluation of ulnar neuropathy.  Follow-up after nerve conduction study to review results.  Follow-Up Instructions: No follow-ups on file.   Orders:  Orders Placed This Encounter  Procedures   US Guided Needle Placement - No Linked Charges   Ambulatory referral to Physical Medicine Rehab   No orders of the defined types were placed in this encounter.     Procedures: Medium Joint Inj: L acromioclavicular on 07/26/2021 8:22 PM Indications: diagnostic evaluation and pain Details: 25 G 1.5 in needle, ultrasound-guided lateral approach Medications: 3 mL lidocaine 1 %; 0.66 mL bupivacaine 0.25 %; 13.33 mg methylPREDNISolone acetate 40 MG/ML Outcome: tolerated well, no immediate complications Procedure, treatment alternatives, risks and  benefits explained, specific risks discussed. Consent was given by the patient. Immediately prior to procedure a time out was called to verify the correct patient, procedure, equipment, support staff and site/side marked as required. Patient was prepped and draped in the usual sterile fashion.       Clinical Data: No additional findings.  Objective: Vital Signs: There were no vitals taken for this visit.  Physical Exam:  Constitutional: Patient appears well-developed HEENT:  Head: Normocephalic Eyes:EOM are normal Neck: Normal range of  motion Cardiovascular: Normal rate Pulmonary/chest: Effort normal Neurologic: Patient is alert Skin: Skin is warm Psychiatric: Patient has normal mood and affect  Ortho Exam: Ortho exam of right shoulder demonstrates 35 degrees X rotation, 90 degrees abduction, 160 degrees forward flexion.  Excellent strength of supra, infra, subscap rated 5/5.  Incisions are well-healed from prior surgery.  Moderate tenderness over the [facility] joint in both shoulders with left slightly worse than the right.  Positive crossarm adduction pain with the left shoulder but not on the right.  Positive Froment's sign bilaterally.  No subluxing ulnar nerve noted bilaterally.  Slight weakness of finger abduction in both hands, primarily of the small finger rated 5 -/5.  Specialty Comments:  No specialty comments available.  Imaging: No results found.   PMFS History: Patient Active Problem List   Diagnosis Date Noted   Unilateral conductive hearing loss 02/26/2021   Pre-syncope 02/26/2021   Complete tear of right rotator cuff    Biceps tendonitis on right    Degenerative superior labral anterior-to-posterior (SLAP) tear of right shoulder    Acute pain of right shoulder 01/11/2020   Blood pressure check 10/20/2019   Mood changes 04/12/2019   Acute midline low back pain without sciatica 02/13/2019   Erectile dysfunction 07/23/2018   Ganglion cyst of dorsum of left wrist 04/20/2018   Prediabetes 04/17/2016   OBESITY, NOS 04/16/2006   Past Medical History:  Diagnosis Date   Bug bite 08/09/2018   Duodenal ulcer due to Helicobacter pylori 09/25/2010   EGD confirmed that duodenal ulcers and H. pylori gastritis in 08/2009. Treated with triple  the[facility].    Gallbladder polyp 09/25/2010   GERD (gastroesophageal reflux disease)    Major depression in remission ([facility])    OTHER DISEASES OF NASAL CAVITY AND SINUSES 02/23/2008   Qualifier: Diagnosis of  By: Daphine DeutscherMartin MD, [facility]     RHINITIS, CHRONIC 04/16/2006    Qualifier: Diagnosis of  By: Bebe ShaggyWOODBURY, ANGELICA     Screening for prostate cancer 08/12/2019   TENNIS ELBOW 04/16/2006   Qualifier: Diagnosis of  By: Bebe ShaggyWOODBURY, ANGELICA     Tinea corporis 06/01/2017    Family History  Problem Relation Age of Onset   High blood pressure Father    Kidney disease Cousin    Diabetes Neg Hx    Early death Neg Hx    Heart disease Neg Hx    Hyperlipidemia Neg Hx    Hypertension Neg Hx     Past Surgical History:  Procedure Laterality Date   APPENDECTOMY  1981   CHOLECYSTECTOMY  10/15/10   Path results revealed chronic cholecystitis   ELBOW SURGERY  2005   right   SHOULDER ARTHROSCOPY WITH SUBACROMIAL DECOMPRESSION, ROTATOR CUFF REPAIR AND BICEP TENDON REPAIR Right 02/05/2021   Procedure: RIGHT SHOULDER ARTHROSCOPY, SUBACROMIAL DECOMPRESSION, DEBRIDEMENT, MINI OPEN ROTATOR CUFF TEAR REPAIR, BICEPS TENODESIS;  Surgeon: Cammy Copaean, Gregory Scott, MD;  Location: [facility];  Service: Orthopedics;  Laterality: Right;   UPPER GASTROINTESTINAL ENDOSCOPY  Social History   Occupational History   Occupation: Financial planner: [employer]    Comment: [employer]  Tobacco Use   Smoking status: Never   Smokeless tobacco: Never  Vaping Use   Vaping Use: Never used  Substance and Sexual Activity   Alcohol use: No   Drug use: No   Sexual activity: Yes

## 2021-07-31 ENCOUNTER — Encounter: Payer: No Typology Code available for payment source | Admitting: Rehabilitative and Restorative Service Providers"

## 2021-08-28 ENCOUNTER — Telehealth: Payer: Self-pay | Admitting: Orthopedic Surgery

## 2021-08-28 NOTE — Telephone Encounter (Signed)
Pt called requesting a return to work note. Please call pt when ready for pick up. Pt phone number is [phone].

## 2021-08-28 NOTE — Telephone Encounter (Signed)
Pls advise.I talked with patient. He stated he has not worked since his OV in June due to the severe shoulder pain he is having.  He stated he needs note for work keeping him out of work since his last office visit and then additional time after that. Please advise.

## 2021-08-28 NOTE — Telephone Encounter (Signed)
I looked back at his records.  He was actually released to go back to work on May 15.  Tony Nguyen saw him in injected his [facility] joint.  Needs to come in for that final recheck and release.  Cannot really do a note at this time based on available information.  I have

## 2021-08-29 NOTE — Telephone Encounter (Signed)
IC no answer. LMVM for patient advising per Dr Alfonso Patten note.

## 2021-09-06 ENCOUNTER — Ambulatory Visit (INDEPENDENT_AMBULATORY_CARE_PROVIDER_SITE_OTHER): Payer: No Typology Code available for payment source | Admitting: Physical Medicine and Rehabilitation

## 2021-09-06 ENCOUNTER — Encounter: Payer: Self-pay | Admitting: Physical Medicine and Rehabilitation

## 2021-09-06 DIAGNOSIS — R202 Paresthesia of skin: Secondary | ICD-10-CM

## 2021-09-06 NOTE — Progress Notes (Unsigned)
Pt state pain in both shoulder. Pt state while sleeping he can if pain and numbness that travels to his fingers. Pt state at night it hard for him to sleep due to his pain. Pt state he has hx of shoulder injection and takes over the counter pain meds to help ease his pain.   Numeric Pain Rating Scale and Functional Assessment Average Pain 6   In the last MONTH (on 0-10 scale) has pain interfered with the following?  1. General activity like being  able to carry out your everyday physical activities such as walking, climbing stairs, carrying groceries, or moving a chair?  Rating(9)    -BT

## 2021-09-10 NOTE — Procedures (Signed)
EMG & NCV Findings: Evaluation of the left ulnar motor nerve showed decreased conduction velocity (B Elbow-Wrist, 51 m/s) and decreased conduction velocity (A Elbow-B Elbow, 50 m/s).  The left median (across palm) sensory nerve showed no response (Palm) and prolonged distal peak latency (4.1 ms).  The right median (across palm) sensory nerve showed no response (Palm).  All remaining nerves (as indicated in the following tables) were within normal limits.  Left vs. Right side comparison data for the ulnar motor nerve indicates abnormal L-R latency difference (0.8 ms) and abnormal L-R velocity difference (A Elbow-B Elbow, 23 m/s).  The ulnar sensory nerve indicates abnormal L-R latency difference (0.6 ms).  All remaining left vs. right side differences were within normal limits.    All examined muscles (as indicated in the following table) showed no evidence of electrical instability.    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 There is no significant electrodiagnostic evidence of any other focal nerve entrapment, brachial plexopathy or cervical radiculopathy.   **Although it does not meet criteria for ulnar neuropathy, there is some very mild slowing distal to the left elbow of the ulnar motor nerve.  This is a very mild and does not meet diagnostic criteria and could be technical artifact.  Recommendations: 1.  Follow-up with referring physician. 2.  Continue current management of symptoms.  ___________________________ Naaman Plummer FAAPMR Board Certified, American Board of Physical Medicine and Rehabilitation    Nerve Conduction Studies Anti Sensory Summary Table   Stim Site NR Peak (ms) Norm Peak (ms) P-T Amp (V) Norm P-T Amp Site1 Site2 Delta-P (ms) Dist (cm) Vel (m/s) Norm Vel (m/s)  Left Median Acr Palm Anti Sensory (2nd Digit)  32C  Wrist    *4.1 <3.6 23.5 >10 Wrist  Palm  0.0    Palm *NR  <2.0          Right Median Acr Palm Anti Sensory (2nd Digit)  31.8C  Wrist    3.6 <3.6 25.6 >10 Wrist Palm  0.0    Palm *NR  <2.0          Left Radial Anti Sensory (Base 1st Digit)  31.1C  Wrist    2.2 <3.1 13.8  Wrist Base 1st Digit 2.2 0.0    Right Radial Anti Sensory (Base 1st Digit)  31.5C  Wrist    2.3 <3.1 25.9  Wrist Base 1st Digit 2.3 0.0    Left Ulnar Anti Sensory (5th Digit)  31.6C  Wrist    3.7 <3.7 21.0 >15.0 Wrist 5th Digit 3.7 14.0 38 >38  Right Ulnar Anti Sensory (5th Digit)  31.9C  Wrist    3.1 <3.7 22.4 >15.0 Wrist 5th Digit 3.1 14.0 45 >38   Motor Summary Table   Stim Site NR Onset (ms) Norm Onset (ms) O-P Amp (mV) Norm O-P Amp Site1 Site2 Delta-0 (ms) Dist (cm) Vel (m/s) Norm Vel (m/s)  Left Median Motor (Abd Poll Brev)  30.8C  Wrist    3.5 <4.2 7.8 >5 Elbow Wrist 4.9 25.0 51 >50  Elbow    8.4  3.1         Right Median Motor (Abd Poll Brev)  31.4C  Wrist    3.4 <4.2 8.4 >5 Elbow Wrist 4.5 23.0 51 >50  Elbow    7.9  7.3         Left Ulnar Motor (Abd Dig Min)  31.1C  Wrist    3.8 <4.2 10.0 >3 B Elbow Wrist 4.8 24.5 *51 >53  B Elbow    8.6  3.1  A Elbow B Elbow 2.0 10.0 *50 >53  A Elbow    10.6  9.0         Right Ulnar Motor (Abd Dig Min)  31C  Wrist    3.0 <4.2 9.2 >3 B Elbow Wrist 4.4 25.0 57 >53  B Elbow    7.4  5.4  A Elbow B Elbow 1.5 11.0 73 >53  A Elbow    8.9  6.7          EMG   Side Muscle Nerve Root Ins Act Fibs Psw Amp Dur Poly Recrt Int Dennie Bible Comment  Left Abd Poll Brev Median C8-T1 Nml Nml Nml Nml Nml 0 Nml Nml   Left 1stDorInt Ulnar C8-T1 Nml Nml Nml Nml Nml 0 Nml Nml   Left PronatorTeres Median C6-7 Nml Nml Nml Nml Nml 0 Nml Nml   Left Biceps Musculocut C5-6 Nml Nml Nml Nml Nml 0 Nml Nml   Left Deltoid Axillary C5-6 Nml Nml Nml Nml Nml 0 Nml Nml     Nerve Conduction Studies Anti Sensory Left/Right Comparison   Stim Site L Lat (ms) R Lat (ms) L-R Lat (ms) L Amp (V) R Amp (V) L-R Amp (%) Site1 Site2 L Vel (m/s)  R Vel (m/s) L-R Vel (m/s)  Median Acr Palm Anti Sensory (2nd Digit)  32C  Wrist *4.1 3.6 0.5 23.5 25.6 8.2 Wrist Palm     Palm             Radial Anti Sensory (Base 1st Digit)  31.1C  Wrist 2.2 2.3 0.1 13.8 25.9 46.7 Wrist Base 1st Digit     Ulnar Anti Sensory (5th Digit)  31.6C  Wrist 3.7 3.1 *0.6 21.0 22.4 6.2 Wrist 5th Digit 38 45 7   Motor Left/Right Comparison   Stim Site L Lat (ms) R Lat (ms) L-R Lat (ms) L Amp (mV) R Amp (mV) L-R Amp (%) Site1 Site2 L Vel (m/s) R Vel (m/s) L-R Vel (m/s)  Median Motor (Abd Poll Brev)  30.8C  Wrist 3.5 3.4 0.1 7.8 8.4 7.1 Elbow Wrist 51 51 0  Elbow 8.4 7.9 0.5 3.1 7.3 57.5       Ulnar Motor (Abd Dig Min)  31.1C  Wrist 3.8 3.0 *0.8 10.0 9.2 8.0 B Elbow Wrist *51 57 6  B Elbow 8.6 7.4 1.2 3.1 5.4 42.6 A Elbow B Elbow *50 73 *23  A Elbow 10.6 8.9 1.7 9.0 6.7 25.6          Waveforms:

## 2021-09-10 NOTE — Progress Notes (Signed)
Tony Nguyen - 61 y.o. male MRN [id number]  Date of birth: [birth year]  Office Visit Note: Visit Date: 09/06/2021 PCP: Evelena Leyden, DO Referred by: Cammy Copa, MD  Subjective: Chief Complaint  Patient presents with   Right Shoulder - Pain   Left Shoulder - Pain   Right Hand - Numbness   Left Hand - Numbness   HPI:  Tony Nguyen is a 61 y.o. male who comes in today at the request of Dr. Burnard Bunting for electrodiagnostic study of the Bilateral upper extremities.  Patient is Right hand dominant.  He reports 6 out of 10 chronic worsening pain in both shoulders.  He reports nocturnal complaints of pain and numbness in his fingers in a nondermatomal fashion.  He finds this very hard to sleep at night of the pain of the shoulders and hands.  He has had shoulder issues managed by Dr. August Saucer including biceps tendinitis and rotator cuff problems.  He has a remote history of right ulnar nerve release.  He has had no prior electrodiagnostic studies to review.   ROS Otherwise per HPI.  Assessment & Plan: Visit Diagnoses:    ICD-10-CM   1. 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 There is no significant electrodiagnostic evidence of any other focal nerve entrapment, brachial plexopathy or cervical radiculopathy.   **Although it does not meet criteria for ulnar neuropathy, there is some very mild slowing distal to the left elbow of the ulnar motor nerve.  This is a very mild and does not meet diagnostic criteria and could be technical artifact.  Recommendations: 1.  Follow-up with referring physician. 2.  Continue current management of symptoms.  Meds & Orders: No orders of the defined types were placed in this encounter.   Orders Placed This Encounter  Procedures   NCV with  EMG (electromyography)    Follow-up: Return in about 2 weeks (around 09/20/2021) for  Burnard Bunting, MD.   Procedures: No procedures performed  EMG & NCV Findings: Evaluation of the left ulnar motor nerve showed decreased conduction velocity (B Elbow-Wrist, 51 m/s) and decreased conduction velocity (A Elbow-B Elbow, 50 m/s).  The left median (across palm) sensory nerve showed no response (Palm) and prolonged distal peak latency (4.1 ms).  The right median (across palm) sensory nerve showed no response (Palm).  All remaining nerves (as indicated in the following tables) were within normal limits.  Left vs. Right side comparison data for the ulnar motor nerve indicates abnormal L-R latency difference (0.8 ms) and abnormal L-R velocity difference (A Elbow-B Elbow, 23 m/s).  The ulnar sensory nerve indicates abnormal L-R latency difference (0.6 ms).  All remaining left vs. right side differences were within normal limits.    All examined muscles (as indicated in the following table) showed no evidence of electrical instability.    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 There is no significant electrodiagnostic evidence of any other focal nerve entrapment, brachial plexopathy or cervical radiculopathy.   **Although it does not meet criteria for ulnar neuropathy, there is some very mild slowing distal to the left elbow of the ulnar motor nerve.  This is a very mild and does not meet diagnostic criteria  and could be technical artifact.  Recommendations: 1.  Follow-up with referring physician. 2.  Continue current management of symptoms.  ___________________________ Naaman Plummer FAAPMR Board Certified, American Board of Physical Medicine and Rehabilitation    Nerve Conduction Studies Anti Sensory Summary Table   Stim Site NR Peak (ms) Norm Peak (ms) P-T Amp (V) Norm P-T Amp  Site1 Site2 Delta-P (ms) Dist (cm) Vel (m/s) Norm Vel (m/s)  Left Median Acr Palm Anti Sensory (2nd Digit)  32C  Wrist    *4.1 <3.6 23.5 >10 Wrist Palm  0.0    Palm *NR  <2.0          Right Median Acr Palm Anti Sensory (2nd Digit)  31.8C  Wrist    3.6 <3.6 25.6 >10 Wrist Palm  0.0    Palm *NR  <2.0          Left Radial Anti Sensory (Base 1st Digit)  31.1C  Wrist    2.2 <3.1 13.8  Wrist Base 1st Digit 2.2 0.0    Right Radial Anti Sensory (Base 1st Digit)  31.5C  Wrist    2.3 <3.1 25.9  Wrist Base 1st Digit 2.3 0.0    Left Ulnar Anti Sensory (5th Digit)  31.6C  Wrist    3.7 <3.7 21.0 >15.0 Wrist 5th Digit 3.7 14.0 38 >38  Right Ulnar Anti Sensory (5th Digit)  31.9C  Wrist    3.1 <3.7 22.4 >15.0 Wrist 5th Digit 3.1 14.0 45 >38   Motor Summary Table   Stim Site NR Onset (ms) Norm Onset (ms) O-P Amp (mV) Norm O-P Amp Site1 Site2 Delta-0 (ms) Dist (cm) Vel (m/s) Norm Vel (m/s)  Left Median Motor (Abd Poll Brev)  30.8C  Wrist    3.5 <4.2 7.8 >5 Elbow Wrist 4.9 25.0 51 >50  Elbow    8.4  3.1         Right Median Motor (Abd Poll Brev)  31.4C  Wrist    3.4 <4.2 8.4 >5 Elbow Wrist 4.5 23.0 51 >50  Elbow    7.9  7.3         Left Ulnar Motor (Abd Dig Min)  31.1C  Wrist    3.8 <4.2 10.0 >3 B Elbow Wrist 4.8 24.5 *51 >53  B Elbow    8.6  3.1  A Elbow B Elbow 2.0 10.0 *50 >53  A Elbow    10.6  9.0         Right Ulnar Motor (Abd Dig Min)  31C  Wrist    3.0 <4.2 9.2 >3 B Elbow Wrist 4.4 25.0 57 >53  B Elbow    7.4  5.4  A Elbow B Elbow 1.5 11.0 73 >53  A Elbow    8.9  6.7          EMG   Side Muscle Nerve Root Ins Act Fibs Psw Amp Dur Poly Recrt Int Dennie Bible Comment  Left Abd Poll Brev Median C8-T1 Nml Nml Nml Nml Nml 0 Nml Nml   Left 1stDorInt Ulnar C8-T1 Nml Nml Nml Nml Nml 0 Nml Nml   Left PronatorTeres Median C6-7 Nml Nml Nml Nml Nml 0 Nml Nml   Left Biceps Musculocut C5-6 Nml Nml Nml Nml Nml 0 Nml Nml   Left Deltoid Axillary C5-6 Nml Nml Nml Nml Nml 0 Nml Nml     Nerve Conduction  Studies Anti Sensory Left/Right Comparison   Stim Site L Lat (ms) R Lat (ms) L-R Lat (ms) L Amp (  V) R Amp (V) L-R Amp (%) Site1 Site2 L Vel (m/s) R Vel (m/s) L-R Vel (m/s)  Median Acr Palm Anti Sensory (2nd Digit)  32C  Wrist *4.1 3.6 0.5 23.5 25.6 8.2 Wrist Palm     Palm             Radial Anti Sensory (Base 1st Digit)  31.1C  Wrist 2.2 2.3 0.1 13.8 25.9 46.7 Wrist Base 1st Digit     Ulnar Anti Sensory (5th Digit)  31.6C  Wrist 3.7 3.1 *0.6 21.0 22.4 6.2 Wrist 5th Digit 38 45 7   Motor Left/Right Comparison   Stim Site L Lat (ms) R Lat (ms) L-R Lat (ms) L Amp (mV) R Amp (mV) L-R Amp (%) Site1 Site2 L Vel (m/s) R Vel (m/s) L-R Vel (m/s)  Median Motor (Abd Poll Brev)  30.8C  Wrist 3.5 3.4 0.1 7.8 8.4 7.1 Elbow Wrist 51 51 0  Elbow 8.4 7.9 0.5 3.1 7.3 57.5       Ulnar Motor (Abd Dig Min)  31.1C  Wrist 3.8 3.0 *0.8 10.0 9.2 8.0 B Elbow Wrist *51 57 6  B Elbow 8.6 7.4 1.2 3.1 5.4 42.6 A Elbow B Elbow *50 73 *23  A Elbow 10.6 8.9 1.7 9.0 6.7 25.6          Waveforms:                      Clinical History: No specialty comments available.     Objective:  VS:  HT:    WT:   BMI:     BP:   HR: bpm  TEMP: ( )  RESP:  Physical Exam Musculoskeletal:        General: No tenderness.     Comments: Inspection reveals no atrophy of the bilateral APB or FDI or hand intrinsics. There is no swelling, color changes, allodynia or dystrophic changes. There is 5 out of 5 strength in the bilateral wrist extension, finger abduction and long finger flexion. There is intact sensation to light touch in all dermatomal and peripheral nerve distributions.  There is a negative Phalen's test bilaterally. There is a negative Hoffmann's test bilaterally.  Skin:    General: Skin is warm and dry.     Findings: No erythema or rash.  Neurological:     General: No focal deficit present.     Mental Status: He is alert and oriented to person, place, and time.     Sensory: No sensory deficit.      Motor: No weakness or abnormal muscle tone.     Coordination: Coordination normal.     Gait: Gait normal.  Psychiatric:        Mood and Affect: Mood normal.        Behavior: Behavior normal.        Thought Content: Thought content normal.      Imaging: No results found.

## 2021-09-13 ENCOUNTER — Ambulatory Visit (INDEPENDENT_AMBULATORY_CARE_PROVIDER_SITE_OTHER): Payer: No Typology Code available for payment source | Admitting: Orthopedic Surgery

## 2021-09-13 ENCOUNTER — Encounter: Payer: Self-pay | Admitting: Orthopedic Surgery

## 2021-09-13 DIAGNOSIS — R200 Anesthesia of skin: Secondary | ICD-10-CM

## 2021-09-13 DIAGNOSIS — Z9889 Other specified postprocedural states: Secondary | ICD-10-CM
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
## 2021-09-17 ENCOUNTER — Telehealth: Payer: Self-pay | Admitting: Orthopedic Surgery

## 2021-09-17 NOTE — Telephone Encounter (Signed)
[employer] forms received. To [employer]. °

## 2021-09-25 NOTE — Telephone Encounter (Signed)
Patient called in stating he needs a note keeping him out of work could not say for how long he would like someone to call him please advise

## 2021-09-25 NOTE — Telephone Encounter (Signed)
Note written per Dr Alfonso Patten verbal instructions. Patient notified.

## 2021-11-22 ENCOUNTER — Encounter: Payer: Self-pay | Admitting: Orthopedic Surgery

## 2021-11-22 ENCOUNTER — Ambulatory Visit (INDEPENDENT_AMBULATORY_CARE_PROVIDER_SITE_OTHER): Payer: No Typology Code available for payment source | Admitting: Orthopedic Surgery

## 2021-11-22 ENCOUNTER — Ambulatory Visit (INDEPENDENT_AMBULATORY_CARE_PROVIDER_SITE_OTHER): Payer: No Typology Code available for payment source

## 2021-11-22 DIAGNOSIS — M792 Neuralgia and neuritis, unspecified: Secondary | ICD-10-CM

## 2021-11-22 NOTE — Progress Notes (Signed)
Office Visit Note   Patient: Tony Nguyen           Date of Birth: 1960-02-19           MRN: [id number] Visit Date: 11/22/2021 Requested by: Rise Patience, DO [place],  [place] [address] PCP: Rise Patience, DO  Subjective: Chief Complaint  Patient presents with   Other     Bilateral shoulder pain    HPI: Tony Nguyen is a 61 y.o. male who presents to the office porting bilateral shoulder pain which radiates down both arms into both the ring and small finger in both hands.  Had a history of right shoulder rotator cuff repair.  Pain wakes him from sleep at night.  Has radicular type pain with numbness and tingling.  Also reports some neck pain.  He has finished physical therapy.  Symptoms ongoing for greater than a month.  Taking over-the-counter medication without relief..                ROS: All systems reviewed are negative as they relate to the chief complaint within the history of present illness.  Patient denies fevers or chills.  Assessment & Plan: Visit Diagnoses:  1. Radicular pain in right arm   2. Radicular pain in left arm     Plan: Impression is bilateral arm radiculopathy with symptoms and both fingers.  His right shoulder x-ray has excellent motion and strength.  No coarse grinding or crepitus.  Left shoulder also has a good exam which does not really implicate intra-articular pathology as a source for this radiating arm pain.  Most likely this is coming from the neck.  Plan at this time is MRI scan cervical spine to evaluate to 7-month history of symptoms of radiculopathy.  Note for work provided today.  Follow-up after that study  Follow-Up Instructions: No follow-ups on file.   Orders:  Orders Placed This Encounter  Procedures   XR Cervical Spine 2 or 3 views   MR Cervical Spine w/o contrast   No orders of the defined types were placed in this encounter.     Procedures: No procedures performed   Clinical Data: No  additional findings.  Objective: Vital Signs: There were no vitals taken for this visit.  Physical Exam:  Constitutional: Patient appears well-developed HEENT:  Head: Normocephalic Eyes:EOM are normal Neck: Normal range of motion Cardiovascular: Normal rate Pulmonary/chest: Effort normal Neurologic: Patient is alert Skin: Skin is warm Psychiatric: Patient has normal mood and affect  Ortho Exam: Ortho exam demonstrates some pain with rotation of the head to the right.  Flexion is chin to chest extension about 40 degrees rotation 50 degrees bilaterally.  Patient has negative Tinel's cubital tunnel bilaterally with no subluxation of the ulnar nerve bilaterally.  5 out of 5 grip EPL FPL interosseous wrist flexion extension bicep triceps and deltoid strength.  Range of motion of both shoulders on the right 70/100/165 on the left 60/100/150.  Rotator cuff strength is excellent bilaterally to infraspinatus supraspinatus and subscap muscle testing.  No other masses lymphadenopathy or skin changes noted in either shoulder girdle region.  Specialty Comments:  No specialty comments available.  Imaging: No results found.   PMFS History: Patient Active Problem List   Diagnosis Date Noted   Unilateral conductive hearing loss 02/26/2021   Pre-syncope 02/26/2021   Complete tear of right rotator cuff    Biceps tendonitis on right    Degenerative superior labral anterior-to-posterior (SLAP) tear of  right shoulder    Acute pain of right shoulder 01/11/2020   Blood pressure check 10/20/2019   Mood changes 04/12/2019   Acute midline low back pain without sciatica 02/13/2019   Erectile dysfunction 07/23/2018   Ganglion cyst of dorsum of left wrist 04/20/2018   Prediabetes 04/17/2016   OBESITY, NOS 04/16/2006   Past Medical History:  Diagnosis Date   Bug bite 08/09/2018   Duodenal ulcer due to Helicobacter pylori 09/25/2010   EGD confirmed that duodenal ulcers and H. pylori gastritis in  08/2009. Treated with triple  therapy at [facility].    Gallbladder polyp 09/25/2010   GERD (gastroesophageal reflux disease)    Major depression in remission (HCC)    OTHER DISEASES OF NASAL CAVITY AND SINUSES 02/23/2008   Qualifier: Diagnosis of  By: Daphine Deutscher MD, [facility]     RHINITIS, CHRONIC 04/16/2006   Qualifier: Diagnosis of  By: Bebe Shaggy     Screening for prostate cancer 08/12/2019   TENNIS ELBOW 04/16/2006   Qualifier: Diagnosis of  By: Bebe Shaggy     Tinea corporis 06/01/2017    Family History  Problem Relation Age of Onset   High blood pressure Father    Kidney disease Cousin    Diabetes Neg Hx    Early death Neg Hx    Heart disease Neg Hx    Hyperlipidemia Neg Hx    Hypertension Neg Hx     Past Surgical History:  Procedure Laterality Date   APPENDECTOMY  1981   CHOLECYSTECTOMY  10/15/10   Path results revealed chronic cholecystitis   ELBOW SURGERY  2005   right   SHOULDER ARTHROSCOPY WITH SUBACROMIAL DECOMPRESSION, ROTATOR CUFF REPAIR AND BICEP TENDON REPAIR Right 02/05/2021   Procedure: RIGHT SHOULDER ARTHROSCOPY, SUBACROMIAL DECOMPRESSION, DEBRIDEMENT, MINI OPEN ROTATOR CUFF TEAR REPAIR, BICEPS TENODESIS;  Surgeon: Cammy Copa, MD;  Location: [facility] OR;  Service: Orthopedics;  Laterality: Right;   UPPER GASTROINTESTINAL ENDOSCOPY     Social History   Occupational History   Occupation: Financial planner: [employer]    Comment: [employer]  Tobacco Use   Smoking status: Never   Smokeless tobacco: Never  Vaping Use   Vaping Use: Never used  Substance and Sexual Activity   Alcohol use: No   Drug use: No   Sexual activity: Yes

## 2021-12-12 ENCOUNTER — Ambulatory Visit
Admission: RE | Admit: 2021-12-12 | Discharge: 2021-12-12 | Disposition: A | Discharge status: Home / Self Care | Payer: No Typology Code available for payment source | Source: Ambulatory Visit | Attending: Orthopedic Surgery | Admitting: Orthopedic Surgery

## 2021-12-12 DIAGNOSIS — M792 Neuralgia and neuritis, unspecified: Secondary | ICD-10-CM

## 2021-12-19 ENCOUNTER — Ambulatory Visit
Admission: RE | Admit: 2021-12-19 | Discharge: 2021-12-19 | Disposition: A | Discharge status: Home / Self Care | Payer: No Typology Code available for payment source | Source: Ambulatory Visit | Attending: Orthopedic Surgery | Admitting: Orthopedic Surgery

## 2021-12-24 NOTE — Progress Notes (Signed)
I called.  He would prefer to try injections before surgical evaluation.  Can you cancel the appointment on the 13th and have him follow-up with Dr. Ernestina Patches for discussion of cervical spine ESI's.  Thanks

## 2021-12-25 ENCOUNTER — Other Ambulatory Visit: Payer: Self-pay

## 2021-12-25 DIAGNOSIS — M792 Neuralgia and neuritis, unspecified: Secondary | ICD-10-CM

## 2021-12-27 ENCOUNTER — Encounter: Payer: Self-pay | Admitting: Physical Medicine and Rehabilitation

## 2021-12-27 ENCOUNTER — Ambulatory Visit (INDEPENDENT_AMBULATORY_CARE_PROVIDER_SITE_OTHER): Payer: No Typology Code available for payment source | Admitting: Physical Medicine and Rehabilitation

## 2021-12-27 ENCOUNTER — Ambulatory Visit: Payer: No Typology Code available for payment source | Admitting: Orthopedic Surgery

## 2021-12-27 DIAGNOSIS — M7918 Myalgia, other site: Secondary | ICD-10-CM | POA: Diagnosis not present

## 2021-12-27 DIAGNOSIS — G8929 Other chronic pain: Secondary | ICD-10-CM

## 2021-12-27 DIAGNOSIS — M25512 Pain in left shoulder: Secondary | ICD-10-CM

## 2021-12-27 DIAGNOSIS — M4802 Spinal stenosis, cervical region: Secondary | ICD-10-CM

## 2021-12-27 DIAGNOSIS — M5412 Radiculopathy, cervical region: Secondary | ICD-10-CM | POA: Diagnosis not present

## 2021-12-27 NOTE — Progress Notes (Unsigned)
Tony Nguyen - 61 y.o. male MRN [id number]  Date of birth: August 23, 1960  Office Visit Note: Visit Date: 12/27/2021 PCP: Rise Patience, DO Referred by: Rise Patience, DO  Subjective: Chief Complaint  Patient presents with   Neck - Pain   HPI: Tony Nguyen is a 61 y.o. male who comes in today per the request of Dr. Alphonzo Severance for evaluation of chronic, worsening and severe left sided neck pain radiating to left shoulder and down left upper arm. Pain ongoing since work related accident in July of 2022 where several wooden pallets fell off forklift onto him. His pain is exacerbated by movement and activity, describes as sore and aching, currently rates as 7 out of 10. He reports severe pain when laying flat in bed. Some relief of pain with home exercise regimen, rest and use of medications. Recent cervical MRI imaging exhibits multi level degenerative changes and foraminal narrowing, moderate to severe on the right at C5-C6 and C6-C7. No high grade spinal canal stenosis. No history of cervical surgery/injections. History of right shoulder arthroscopy in December of 2022. He did complete regimen of physical therapy post surgery, but no history of therapy for neck issues. Patient denies focal weakness. Patient denies recent trauma or falls.   Incidentally, patient did mention chronic issues with numbness/tingling to hands. Bilateral upper extremity NCV with EMG performed in our office this past July exhibits mild median nerve entrapment on the left.    Review of Systems  Musculoskeletal:  Positive for neck pain.  Neurological:  Positive for tingling. Negative for sensory change, focal weakness and weakness.       Chronic tingling/numbness to bilateral hands.  All other systems reviewed and are negative.  Otherwise per HPI.  Assessment & Plan: Visit Diagnoses:    ICD-10-CM   1. Radiculopathy, cervical region  M54.12 Ambulatory referral to Physical Therapy    2. Foraminal  stenosis of cervical region  M48.02 Ambulatory referral to Physical Therapy    3. Chronic left shoulder pain  M25.512 Ambulatory referral to Physical Therapy   G89.29     4. Myofascial pain syndrome  M79.18 Ambulatory referral to Physical Therapy       Plan: Findings:  Chronic, worsening and severe left sided neck pain radiating to left shoulder and down left upper arm. Patient continues to have severe pain despite good conservative therapies such as home exercise regimen, rest and use of medications. Patients clinical presentation is consistent with C5/C6 nerve pattern. Could also be related to myofascial pain syndrome, he does have tenderness with palpation to left trapezius region. Next step is to place order for regimen of formal physical therapy with our in house team, I do feel he could benefit from manual treatments and possible dry needling. If his pain persists we did discuss possibility of performing cervical epidural steroid injection. I did discuss injection procedure with patient today, he feels more comfortable starting his treatment with PT and would like to hold on injection at this time. I would like to see patient back in the office in approximately 4 weeks for re-evaluation. Patient encouraged to remain active as tolerated. No red flag symptoms noted upon exam today.   Patient instructed to follow up with Dr. Marlou Sa for further management of carpel tunnel issues.     Meds & Orders: No orders of the defined types were placed in this encounter.   Orders Placed This Encounter  Procedures   Ambulatory referral to Physical Therapy  Follow-up: Return for 4 week follow up for re-evaluation.   Procedures: No procedures performed      Clinical History: EXAM: MRI CERVICAL SPINE WITHOUT CONTRAST   TECHNIQUE: Multiplanar, multisequence MR imaging of the cervical spine was performed. No intravenous contrast was administered.   COMPARISON:  Cervical spine radiographs  11/22/2021.   FINDINGS: Alignment: Straightening of the expected cervical lordosis. No significant spondylolisthesis.   Vertebrae: Vertebral body height is maintained. Mild degenerative endplate edema at D34-534. Hemangiomas within the C7 and T1 vertebral bodies.   Cord: No signal abnormality identified within the cervical spinal cord.   Posterior Fossa, vertebral arteries, paraspinal tissues: No abnormality identified within included portions of the posterior fossa. Flow voids preserved within the imaged cervical vertebral arteries. No paraspinal mass or collection.   Disc levels:   Multilevel disc degeneration, greatest at C5-C6 (mild-to-moderate), C6-C7 (moderate) and C7-T1 (moderate).   C2-C3: No significant disc herniation or stenosis.   C3-C4: Shallow disc bulge. Minimal uncovertebral hypertrophy on the right. No significant spinal canal or foraminal stenosis.   C4-C5: Shallow disc bulge. Uncovertebral hypertrophy on the right. Mild facet arthrosis. No significant spinal canal stenosis. Mild relative right neural foraminal narrowing.   C5-C6: Disc bulge with bilateral disc osteophyte ridge/uncinate hypertrophy. Mild effacement of the ventral thecal sac (without spinal cord mass effect). Bilateral neural foraminal narrowing (moderate to severe right, moderate left).   C6-C7: Disc bulge with bilateral disc osteophyte ridge/uncinate hypertrophy. Mild effacement of the ventral thecal sac (without spinal cord mass effect). Bilateral neural foraminal narrowing (moderate to severe right, moderate left).   C7-T1: Disc bulge with bilateral disc osteophyte ridge/uncinate hypertrophy. Mild effacement of the ventral thecal sac (without spinal cord mass effect). Severe bilateral neural foraminal narrowing.   IMPRESSION: Cervical spondylosis, as outlined.   No more than mild spinal canal narrowing.   Multilevel foraminal stenosis, greatest bilaterally at C5-C6 (moderate to  severe right, moderate left), bilaterally at C6-C7 (moderate to severe right, moderate left) and bilaterally at C7-T1 (severe).   Disc degeneration is greatest at C5-C6 (mild-to-moderate), C6-C7 (moderate) and C7-T1 (moderate). Mild degenerative endplate edema at D34-534.     Electronically Signed   By: Kellie Simmering D.O.   On: 12/20/2021 09:42   He reports that he has never smoked. He has never used smokeless tobacco.  Recent Labs    05/31/21 1412  HGBA1C 6.6*    Objective:  VS:  HT:    WT:   BMI:     BP:   HR: bpm  TEMP: ( )  RESP:  Physical Exam Vitals and nursing note reviewed.  HENT:     Head: Normocephalic and atraumatic.     Right Ear: External ear normal.     Left Ear: External ear normal.     Nose: Nose normal.     Mouth/Throat:     Mouth: Mucous membranes are moist.  Eyes:     Extraocular Movements: Extraocular movements intact.  Cardiovascular:     Rate and Rhythm: Normal rate.     Pulses: Normal pulses.  Pulmonary:     Effort: Pulmonary effort is normal.  Abdominal:     General: Abdomen is flat. There is no distension.  Musculoskeletal:        General: Tenderness present.     Cervical back: Tenderness present.     Comments: Discomfort noted with flexion, extension and side-to-side rotation. Patient has good strength in the upper extremities including 5 out of 5 strength in wrist extension,  long finger flexion and APB. There is no atrophy of the hands intrinsically. Sensation intact bilaterally. Dysesthesias noted to left C5/C6 dermatomes. Tenderness noted upon palpation of left trapezius region. Negative Hoffman's sign.  Skin:    General: Skin is warm and dry.     Capillary Refill: Capillary refill takes less than 2 seconds.  Neurological:     General: No focal deficit present.     Mental Status: He is alert and oriented to person, place, and time.  Psychiatric:        Mood and Affect: Mood normal.        Behavior: Behavior normal.     Ortho  Exam  Imaging: No results found.  Past Medical/Family/Surgical/Social History: Medications & Allergies reviewed per EMR, new medications updated. Patient Active Problem List   Diagnosis Date Noted   Unilateral conductive hearing loss 02/26/2021   Pre-syncope 02/26/2021   Complete tear of right rotator cuff    Biceps tendonitis on right    Degenerative superior labral anterior-to-posterior (SLAP) tear of right shoulder    Acute pain of right shoulder 01/11/2020   Blood pressure check 10/20/2019   Mood changes 04/12/2019   Acute midline low back pain without sciatica 02/13/2019   Erectile dysfunction 07/23/2018   Ganglion cyst of dorsum of left wrist 04/20/2018   Prediabetes 04/17/2016   OBESITY, NOS 04/16/2006   Past Medical History:  Diagnosis Date   Bug bite 08/09/2018   Duodenal ulcer due to Helicobacter pylori 09/25/2010   EGD confirmed that duodenal ulcers and H. pylori gastritis in 08/2009. Treated with triple  therapy at [facility].    Gallbladder polyp 09/25/2010   GERD (gastroesophageal reflux disease)    Major depression in remission (HCC)    OTHER DISEASES OF NASAL CAVITY AND SINUSES 02/23/2008   Qualifier: Diagnosis of  By: Daphine Deutscher MD, [facility]     RHINITIS, CHRONIC 04/16/2006   Qualifier: Diagnosis of  By: Bebe Shaggy     Screening for prostate cancer 08/12/2019   TENNIS ELBOW 04/16/2006   Qualifier: Diagnosis of  By: Bebe Shaggy     Tinea corporis 06/01/2017   Family History  Problem Relation Age of Onset   High blood pressure Father    Kidney disease Cousin    Diabetes Neg Hx    Early death Neg Hx    Heart disease Neg Hx    Hyperlipidemia Neg Hx    Hypertension Neg Hx    Past Surgical History:  Procedure Laterality Date   APPENDECTOMY  1981   CHOLECYSTECTOMY  10/15/10   Path results revealed chronic cholecystitis   ELBOW SURGERY  2005   right   SHOULDER ARTHROSCOPY WITH SUBACROMIAL DECOMPRESSION, ROTATOR CUFF REPAIR AND BICEP TENDON REPAIR  Right 02/05/2021   Procedure: RIGHT SHOULDER ARTHROSCOPY, SUBACROMIAL DECOMPRESSION, DEBRIDEMENT, MINI OPEN ROTATOR CUFF TEAR REPAIR, BICEPS TENODESIS;  Surgeon: Cammy Copa, MD;  Location: [facility] OR;  Service: Orthopedics;  Laterality: Right;   UPPER GASTROINTESTINAL ENDOSCOPY     Social History   Occupational History   Occupation: Financial planner: [employer]    Comment: [employer]  Tobacco Use   Smoking status: Never   Smokeless tobacco: Never  Vaping Use   Vaping Use: Never used  Substance and Sexual Activity   Alcohol use: No   Drug use: No   Sexual activity: Yes

## 2021-12-27 NOTE — Progress Notes (Unsigned)
Numeric Pain Rating Scale and Functional Assessment Average Pain 7   In the last MONTH (on 0-10 scale) has pain interfered with the following?  1. General activity like being  able to carry out your everyday physical activities such as walking, climbing stairs, carrying groceries, or moving a chair?  Rating(8)   Neck and shoulder pain. Pain in neck is on the left side. Lying down makes pain worse, rates at 9

## 2021-12-28 ENCOUNTER — Other Ambulatory Visit: Payer: No Typology Code available for payment source

## 2021-12-30 ENCOUNTER — Ambulatory Visit: Payer: No Typology Code available for payment source | Admitting: Orthopedic Surgery

## 2022-01-02 ENCOUNTER — Ambulatory Visit (INDEPENDENT_AMBULATORY_CARE_PROVIDER_SITE_OTHER): Payer: No Typology Code available for payment source | Admitting: Physical Therapy

## 2022-01-02 ENCOUNTER — Encounter: Payer: Self-pay | Admitting: Physical Therapy

## 2022-01-02 ENCOUNTER — Other Ambulatory Visit: Payer: Self-pay

## 2022-01-02 DIAGNOSIS — M6281 Muscle weakness (generalized): Secondary | ICD-10-CM

## 2022-01-02 DIAGNOSIS — M25512 Pain in left shoulder: Secondary | ICD-10-CM | POA: Diagnosis not present

## 2022-01-02 DIAGNOSIS — G8929 Other chronic pain: Secondary | ICD-10-CM

## 2022-01-02 DIAGNOSIS — R600 Localized edema: Secondary | ICD-10-CM

## 2022-01-02 DIAGNOSIS — M542 Cervicalgia: Secondary | ICD-10-CM | POA: Diagnosis not present

## 2022-01-02 NOTE — Therapy (Signed)
OUTPATIENT PHYSICAL THERAPY CERVICAL EVALUATION   Patient Name: Tony Nguyen MRN: [id number] DOB:08/10/1960, 61 y.o., male Today's Date: 01/02/2022  END OF SESSION:  PT End of Session - 01/02/22 1500     Visit Number 1    Number of Visits 10    Date for PT Re-Evaluation 02/27/22    Authorization - Number of Visits 10   per MD referral   PT Start Time 1420    PT Stop Time 1505    PT Time Calculation (min) 45 min    Activity Tolerance Patient tolerated treatment well    Behavior During Therapy [facility] for tasks assessed/performed             Past Medical History:  Diagnosis Date   Bug bite 08/09/2018   Duodenal ulcer due to Helicobacter pylori 09/25/2010   EGD confirmed that duodenal ulcers and H. pylori gastritis in 08/2009. Treated with triple  therapy at [facility].    Gallbladder polyp 09/25/2010   GERD (gastroesophageal reflux disease)    Major depression in remission (HCC)    OTHER DISEASES OF NASAL CAVITY AND SINUSES 02/23/2008   Qualifier: Diagnosis of  By: Daphine Deutscher MD, [facility]     RHINITIS, CHRONIC 04/16/2006   Qualifier: Diagnosis of  By: Bebe Shaggy     Screening for prostate cancer 08/12/2019   TENNIS ELBOW 04/16/2006   Qualifier: Diagnosis of  By: Bebe Shaggy     Tinea corporis 06/01/2017   Past Surgical History:  Procedure Laterality Date   APPENDECTOMY  1981   CHOLECYSTECTOMY  10/15/10   Path results revealed chronic cholecystitis   ELBOW SURGERY  2005   right   SHOULDER ARTHROSCOPY WITH SUBACROMIAL DECOMPRESSION, ROTATOR CUFF REPAIR AND BICEP TENDON REPAIR Right 02/05/2021   Procedure: RIGHT SHOULDER ARTHROSCOPY, SUBACROMIAL DECOMPRESSION, DEBRIDEMENT, MINI OPEN ROTATOR CUFF TEAR REPAIR, BICEPS TENODESIS;  Surgeon: Cammy Copa, MD;  Location: [facility] OR;  Service: Orthopedics;  Laterality: Right;   UPPER GASTROINTESTINAL ENDOSCOPY     Patient Active Problem List   Diagnosis Date Noted   Unilateral conductive hearing loss 02/26/2021    Pre-syncope 02/26/2021   Complete tear of right rotator cuff    Biceps tendonitis on right    Degenerative superior labral anterior-to-posterior (SLAP) tear of right shoulder    Acute pain of right shoulder 01/11/2020   Blood pressure check 10/20/2019   Mood changes 04/12/2019   Acute midline low back pain without sciatica 02/13/2019   Erectile dysfunction 07/23/2018   Ganglion cyst of dorsum of left wrist 04/20/2018   Prediabetes 04/17/2016   OBESITY, NOS 04/16/2006    PCP: Evelena Leyden, DO   REFERRING PROVIDER: Juanda Chance, NP  REFERRING DIAG: [phone] (ICD-10-CM) - Radiculopathy, cervical region M25.512,G89.29 (ICD-10-CM) - Chronic left shoulder pain M79.18 (ICD-10-CM) - Myofascial pain syndrome M48.02 (ICD-10-CM) - Foraminal stenosis of cervical region  THERAPY DIAG:  Cervicalgia  Chronic left shoulder pain  Muscle weakness (generalized)  Localized edema  Rationale for Evaluation and Treatment: Rehabilitation  ONSET DATE: Pain since July 2022  SUBJECTIVE:  SUBJECTIVE STATEMENT:Pain ongoing since work related accident in July of 2022 where several wooden pallets fell off forklift onto him. Pain is worse at night and he can not sleep on his side or belly. He had Rt shoulder surgery last year and says the pain became worse on his left side when he had to do more activity there due to not being able to use his Rt side. He relays N/T in his last 2 fingers bilat. He says MD wanted to give him injection but he did not want to try this yet, he wanted to try PT first   PERTINENT HISTORY:  s/p Rt shoulder SAD, mini open rotator cuff repair, biceps tenodesis - 02/05/2021  PAIN:  Are you having pain? Yes: NPRS scale: 6 currently, can get up to 10/10 Pain location: left neck  and shoulder Pain description: burning pain Aggravating factors: laying on his side or stomach, turning his head, reaching up or behind his back. Relieving factors: pain ointment, sometimes ice, has not tried heat  PRECAUTIONS: None  WEIGHT BEARING RESTRICTIONS: No  FALLS:  Has patient fallen in last 6 months? No  OCCUPATION: currently out of work, tried to go back but unable to perform, he is a Technical brewer with frequent lifting.   PLOF: Independent  PATIENT GOALS: "to get better"  NEXT MD VISIT:   OBJECTIVE:   DIAGNOSTIC FINDINGS:  Recent cervical MRI imaging exhibits multi level degenerative changes and foraminal narrowing, moderate to severe on the right at C5-C6 and C6-C7. No high grade spinal canal stenosis. Bilateral upper extremity NCV with EMG performed exhibits mild median nerve entrapment on the left.   PATIENT SURVEYS:  Eval: FOTO 55% functional  COGNITION: Overall cognitive status: Within functional limits for tasks assessed  SENSATION: WFL  POSTURE: rounded shoulders and forward head  PALPATION: Tender to palpation in lateral left shoulder and upper trap   CERVICAL ROM:   Active ROM A/PROM (deg) eval  Flexion 30  Extension 30  Right lateral flexion 20  Left lateral flexion 10  Right rotation 50  Left rotation 45   (Blank rows = not tested)  UPPER EXTREMITY ROM:  Active ROM Right eval Left eval  Shoulder flexion  120  Shoulder extension    Shoulder abduction  130  Shoulder adduction    Shoulder extension    Shoulder internal rotation  Reaching behind back to iliac crest  Shoulder external rotation  [facility]  Elbow flexion    Elbow extension    Wrist flexion    Wrist extension    Wrist ulnar deviation    Wrist radial deviation    Wrist pronation    Wrist supination     (Blank rows = not tested)  UPPER EXTREMITY MMT:  MMT Right eval Left eval  Shoulder flexion 4+ 4  Shoulder extension    Shoulder abduction 4+ 4  Shoulder  adduction    Shoulder extension    Shoulder internal rotation 4+ 4+  Shoulder external rotation 5 4+  Middle trapezius    Lower trapezius    Elbow flexion    Elbow extension    Wrist flexion    Wrist extension    Wrist ulnar deviation    Wrist radial deviation    Wrist pronation    Wrist supination    Grip strength     (Blank rows = not tested)  CERVICAL SPECIAL TESTS:  Eval Spurling's test: Negative inconclusive shoulder impingement signs  FUNCTIONAL TESTS:    TODAY'S TREATMENT:  DATE:   Eval HEP creation and review, see below for details -Vasopnuematic device X 10 min, low compression, 34 deg to Lt shoulder -IFC TENS to left shoulder and upper trap X 15 min with vaso    PATIENT EDUCATION: Education details: HEP, PT plan of care Person educated: Patient Education method: Explanation, Demonstration, Verbal cues, and Handouts Education comprehension: verbalized understanding and needs further education   HOME EXERCISE PROGRAM: Access Code: [email or web address] URL: [email or web address] Date: 01/02/2022 Prepared by: Ivery Quale  Exercises - Supine Chin Tuck  - 2 x daily - 6 x weekly - 1-2 sets - 10 reps - Seated Gentle Upper Trapezius Stretch (Mirrored)  - 2 x daily - 6 x weekly - 1 sets - 3 reps - 20 sec hold - Seated Levator Scapulae Stretch  - 2 x daily - 6 x weekly - 1 sets - 3 reps - 20 sec hold - Standing Single Arm Shoulder Flexion Stretch on Wall (Mirrored)  - 2 x daily - 6 x weekly - 1 sets - 10 reps - Standing Shoulder Internal Rotation Stretch with Towel  - 2 x daily - 6 x weekly - 1 sets - 10 reps - 5 sec hold - Shoulder External Rotation and Scapular Retraction with Resistance  - 2 x daily - 6 x weekly - 1-2 sets - 10 reps  ASSESSMENT:  CLINICAL IMPRESSION: Patient referred to PT for Eval and treat left sided neck pain,  chronic left shoulder pain and myofascial pain. MD recommending to "Consider manual treatments and dry needling, requested 10 visits." Patient will benefit from skilled PT to address below impairments, limitations and improve overall function.  OBJECTIVE IMPAIRMENTS: decreased activity tolerance, decreased shoulder mobility, decreased ROM, decreased strength, impaired flexibility, impaired UE use, postural dysfunction, and pain.  ACTIVITY LIMITATIONS: reaching, lifting, carry,  cleaning, driving, and or occupation  PERSONAL FACTORS: previous Rt shoulder surgery also affecting patient's functional outcome.  REHAB POTENTIAL: Good  CLINICAL DECISION MAKING: Stable/uncomplicated  EVALUATION COMPLEXITY: Low    GOALS: Short term PT Goals Target date: 01/30/2022 Pt will be I and compliant with HEP. Baseline:  Goal status: New Pt will decrease pain by 25% overall Baseline: Goal status: New  Long term PT goals Target date: 03/13/2022 Pt will improve Rt shoulder AROM to [facility] to improve functional reaching Baseline: Goal status: New Pt will improve  Rt shoulder strength to at least 4+/5 MMT to improve functional strength Baseline: Goal status: New Pt will improve FOTO to at least 66% functional to show improved function Baseline: Goal status: New Pt will reduce pain to overall less than 3/10 with usual activity and work activity. Baseline: Goal status: New  PLAN: PT FREQUENCY: 1-2 times per week   PT DURATION: 8-10 weeks  PLANNED INTERVENTIONS (unless contraindicated): aquatic PT, Canalith repositioning, cryotherapy, Electrical stimulation, Iontophoresis with 4 mg/ml dexamethasome, Moist heat, traction, Ultrasound, gait training, Therapeutic exercise, balance training, neuromuscular re-education, patient/family education, prosthetic training, manual techniques, passive ROM, dry needling, taping, vasopnuematic device, vestibular, spinal manipulations, joint manipulations  PLAN FOR  NEXT SESSION: review HEP, how was TENS from last time, trial manual therapy and DN, needs left shoulder strengthening as tolerated.    April Manson, PT,DPT 01/02/2022, 3:07 PM

## 2022-01-15 ENCOUNTER — Ambulatory Visit (INDEPENDENT_AMBULATORY_CARE_PROVIDER_SITE_OTHER): Payer: No Typology Code available for payment source | Admitting: Rehabilitative and Restorative Service Providers"

## 2022-01-15 ENCOUNTER — Encounter: Payer: Self-pay | Admitting: Rehabilitative and Restorative Service Providers"

## 2022-01-15 DIAGNOSIS — R600 Localized edema: Secondary | ICD-10-CM

## 2022-01-15 DIAGNOSIS — M25512 Pain in left shoulder: Secondary | ICD-10-CM | POA: Diagnosis not present

## 2022-01-15 DIAGNOSIS — G8929 Other chronic pain: Secondary | ICD-10-CM

## 2022-01-15 DIAGNOSIS — M542 Cervicalgia: Secondary | ICD-10-CM | POA: Diagnosis not present

## 2022-01-15 DIAGNOSIS — M6281 Muscle weakness (generalized): Secondary | ICD-10-CM

## 2022-01-15 NOTE — Therapy (Signed)
OUTPATIENT PHYSICAL THERAPY TREATMENT   Patient Name: Tony Nguyen MRN: [id number] DOB:03-29-1960, 61 y.o., male Today's Date: 01/15/2022  END OF SESSION:  PT End of Session - 01/15/22 1159     Visit Number 2    Number of Visits 10    Date for PT Re-Evaluation 02/27/22    Authorization - Number of Visits 10   per MD referral   PT Start Time 1138    PT Stop Time 1221    PT Time Calculation (min) 43 min    Activity Tolerance Patient tolerated treatment well    Behavior During Therapy [facility] for tasks assessed/performed              Past Medical History:  Diagnosis Date   Bug bite 08/09/2018   Duodenal ulcer due to Helicobacter pylori 09/25/2010   EGD confirmed that duodenal ulcers and H. pylori gastritis in 08/2009. Treated with triple  therapy at [facility].    Gallbladder polyp 09/25/2010   GERD (gastroesophageal reflux disease)    Major depression in remission (HCC)    OTHER DISEASES OF NASAL CAVITY AND SINUSES 02/23/2008   Qualifier: Diagnosis of  By: Daphine Deutscher MD, [facility]     RHINITIS, CHRONIC 04/16/2006   Qualifier: Diagnosis of  By: Bebe Shaggy     Screening for prostate cancer 08/12/2019   TENNIS ELBOW 04/16/2006   Qualifier: Diagnosis of  By: Bebe Shaggy     Tinea corporis 06/01/2017   Past Surgical History:  Procedure Laterality Date   APPENDECTOMY  1981   CHOLECYSTECTOMY  10/15/10   Path results revealed chronic cholecystitis   ELBOW SURGERY  2005   right   SHOULDER ARTHROSCOPY WITH SUBACROMIAL DECOMPRESSION, ROTATOR CUFF REPAIR AND BICEP TENDON REPAIR Right 02/05/2021   Procedure: RIGHT SHOULDER ARTHROSCOPY, SUBACROMIAL DECOMPRESSION, DEBRIDEMENT, MINI OPEN ROTATOR CUFF TEAR REPAIR, BICEPS TENODESIS;  Surgeon: Cammy Copa, MD;  Location: [facility] OR;  Service: Orthopedics;  Laterality: Right;   UPPER GASTROINTESTINAL ENDOSCOPY     Patient Active Problem List   Diagnosis Date Noted   Unilateral conductive hearing loss 02/26/2021    Pre-syncope 02/26/2021   Complete tear of right rotator cuff    Biceps tendonitis on right    Degenerative superior labral anterior-to-posterior (SLAP) tear of right shoulder    Acute pain of right shoulder 01/11/2020   Blood pressure check 10/20/2019   Mood changes 04/12/2019   Acute midline low back pain without sciatica 02/13/2019   Erectile dysfunction 07/23/2018   Ganglion cyst of dorsum of left wrist 04/20/2018   Prediabetes 04/17/2016   OBESITY, NOS 04/16/2006    PCP: Evelena Leyden, DO   REFERRING PROVIDER: Juanda Chance, NP  REFERRING DIAG: [phone] (ICD-10-CM) - Radiculopathy, cervical region M25.512,G89.29 (ICD-10-CM) - Chronic left shoulder pain M79.18 (ICD-10-CM) - Myofascial pain syndrome M48.02 (ICD-10-CM) - Foraminal stenosis of cervical region  THERAPY DIAG:  Chronic left shoulder pain  Cervicalgia  Muscle weakness (generalized)  Localized edema  Rationale for Evaluation and Treatment: Rehabilitation  ONSET DATE: Pain since July 2022  SUBJECTIVE:  SUBJECTIVE STATEMENT: He indicated that lifting arm and reaching back still hurt with pain at rest.  Noted in Lt neck and shoulder.    PERTINENT HISTORY:  s/p Rt shoulder SAD, mini open rotator cuff repair, biceps tenodesis - 02/05/2021  PAIN:  NPRS scale: 8/10 Pain location: left neck and shoulder Pain description: burning pain Aggravating factors: reaching back, lifting arm, pressure on arm Relieving factors: pain ointment, sometimes ice, has not tried heat  PRECAUTIONS: None  WEIGHT BEARING RESTRICTIONS: No  FALLS:  Has patient fallen in last 6 months? No  OCCUPATION: currently out of work, tried to go back but unable to perform, he is a Technical brewer with frequent lifting.   PLOF:  Independent  PATIENT GOALS: "to get better"  NEXT MD VISIT:   OBJECTIVE:   DIAGNOSTIC FINDINGS:  01/02/2022 Recent cervical MRI imaging exhibits multi level degenerative changes and foraminal narrowing, moderate to severe on the right at C5-C6 and C6-C7. No high grade spinal canal stenosis. Bilateral upper extremity NCV with EMG performed exhibits mild median nerve entrapment on the left.   PATIENT SURVEYS: 01/02/2022  Eval: FOTO 55% functional  COGNITION: 01/02/2022 Overall cognitive status: Within functional limits for tasks assessed  SENSATION: 01/02/2022 [facility]  POSTURE:  01/02/2022 rounded shoulders and forward head  PALPATION: 01/02/2022 Tender to palpation in lateral left shoulder and upper trap   CERVICAL ROM:   Active ROM A/PROM (deg) 01/02/2022  Flexion 30  Extension 30  Right lateral flexion 20  Left lateral flexion 10  Right rotation 50  Left rotation 45   (Blank rows = not tested)  UPPER EXTREMITY ROM:  Active ROM Right 01/02/2022 Left 01/02/2022  Shoulder flexion  120  Shoulder extension    Shoulder abduction  130  Shoulder adduction    Shoulder extension    Shoulder internal rotation  Reaching behind back to iliac crest  Shoulder external rotation  [facility]  Elbow flexion    Elbow extension    Wrist flexion    Wrist extension    Wrist ulnar deviation    Wrist radial deviation    Wrist pronation    Wrist supination     (Blank rows = not tested)  UPPER EXTREMITY MMT:  MMT Right 01/02/2022 Left 01/02/2022 Right 01/15/2022 Left 01/15/2022  Shoulder flexion 4+ 4 5/5 4/5 c pain  Shoulder extension      Shoulder abduction 4+ 4 5/5 4/5 c pain  Shoulder adduction      Shoulder extension      Shoulder internal rotation 4+ 4+ 5/5 5/5  Shoulder external rotation 5 4+ 5/5 5/5  Middle trapezius      Lower trapezius      Elbow flexion      Elbow extension      Wrist flexion      Wrist extension      Wrist ulnar deviation      Wrist radial  deviation      Wrist pronation      Wrist supination      Grip strength       (Blank rows = not tested)  CERVICAL SPECIAL TESTS:  01/02/2022 Spurling's test: Negative inconclusive shoulder impingement signs  FUNCTIONAL TESTS:    TODAY'S TREATMENT:  DATE:  01/15/2022 Manual:  Percussive device to Lt infraspinatus, Lt upper trap, Lt levator in Rt sidelying for myofascial release techniques (Pt deferred dry needling option)  Therex:  Standing blue band rows 2 x 15 c scap retraction  Standing blue band GH ext past legs 3 sec hold 2 x 10  Standing ER c arm at side c towel green band 2 x 10 bilateral, focus on slow movement pattern  Estim  IFC to Lt shoulder to tolerance 10 mins in sitting, no adverse reaction    TODAY'S TREATMENT:                                                                                                                             DATE:  01/02/2022 HEP creation and review, see below for details -Vasopnuematic device X 10 min, low compression, 34 deg to Lt shoulder -IFC TENS to left shoulder and upper trap X 15 min with vaso    PATIENT EDUCATION: Education details: HEP, PT plan of care Person educated: Patient Education method: Explanation, Demonstration, Verbal cues, and Handouts Education comprehension: verbalized understanding and needs further education   HOME EXERCISE PROGRAM: Access Code: [email or web address] URL: [email or web address] Date: 01/02/2022 Prepared by: Ivery QualeBrian Nelson  Exercises - Supine Chin Tuck  - 2 x daily - 6 x weekly - 1-2 sets - 10 reps - Seated Gentle Upper Trapezius Stretch (Mirrored)  - 2 x daily - 6 x weekly - 1 sets - 3 reps - 20 sec hold - Seated Levator Scapulae Stretch  - 2 x daily - 6 x weekly - 1 sets - 3 reps - 20 sec hold - Standing Single Arm Shoulder Flexion Stretch on Wall (Mirrored)  - 2  x daily - 6 x weekly - 1 sets - 10 reps - Standing Shoulder Internal Rotation Stretch with Towel  - 2 x daily - 6 x weekly - 1 sets - 10 reps - 5 sec hold - Shoulder External Rotation and Scapular Retraction with Resistance  - 2 x daily - 6 x weekly - 1-2 sets - 10 reps  ASSESSMENT:  CLINICAL IMPRESSION:  Strength deficits c pain noted in Lt shoulder today.  Rt shoulder strength testing improved.  Noted tenderness to touch and guarding in Lt upper trap, levator, deltoid and infraspinatus.  Continued skilled PT services indicated at this time.    OBJECTIVE IMPAIRMENTS: decreased activity tolerance, decreased shoulder mobility, decreased ROM, decreased strength, impaired flexibility, impaired UE use, postural dysfunction, and pain.  ACTIVITY LIMITATIONS: reaching, lifting, carry,  cleaning, driving, and or occupation  PERSONAL FACTORS: previous Rt shoulder surgery also affecting patient's functional outcome.  REHAB POTENTIAL: Good  CLINICAL DECISION MAKING: Stable/uncomplicated  EVALUATION COMPLEXITY: Low    GOALS: Short term PT Goals Target date: 01/30/2022 Pt will be I and compliant with HEP. Baseline:  Goal status: on going 01/15/2022 Pt will decrease pain by 25% overall Baseline: Goal status: on going 01/15/2022  Long term PT goals  Target date: 03/13/2022 Pt will improve Rt shoulder AROM to [facility] to improve functional reaching Baseline: Goal status: New Pt will improve  Rt shoulder strength to at least 4+/5 MMT to improve functional strength Baseline: Goal status: New Pt will improve FOTO to at least 66% functional to show improved function Baseline: Goal status: New Pt will reduce pain to overall less than 3/10 with usual activity and work activity. Baseline: Goal status: New  PLAN: PT FREQUENCY: 1-2 times per week   PT DURATION: 8-10 weeks  PLANNED INTERVENTIONS (unless contraindicated): aquatic PT, Canalith repositioning, cryotherapy, Electrical stimulation,  Iontophoresis with 4 mg/ml dexamethasome, Moist heat, traction, Ultrasound, gait training, Therapeutic exercise, balance training, neuromuscular re-education, patient/family education, prosthetic training, manual techniques, passive ROM, dry needling, taping, vasopnuematic device, vestibular, spinal manipulations, joint manipulations  PLAN FOR NEXT SESSION: Manual as desired for pain relief.  Progressive strengthening for shoulders.  Estim as desired.    Chyrel Masson, PT, DPT, OCS, ATC 01/15/22  12:19 PM

## 2022-01-17 ENCOUNTER — Encounter: Payer: Self-pay | Admitting: Orthopedic Surgery

## 2022-01-17 ENCOUNTER — Ambulatory Visit (INDEPENDENT_AMBULATORY_CARE_PROVIDER_SITE_OTHER): Payer: No Typology Code available for payment source | Admitting: Orthopedic Surgery

## 2022-01-17 DIAGNOSIS — M25512 Pain in left shoulder: Secondary | ICD-10-CM

## 2022-01-17 DIAGNOSIS — G8929 Other chronic pain: Secondary | ICD-10-CM

## 2022-01-17 NOTE — Progress Notes (Signed)
Office Visit Note   Patient: Tony Nguyen           Date of Birth: Oct 18, 1960           MRN: [id number] Visit Date: 01/17/2022 Requested by: Evelena Leyden, DO [address] [place],  [place] [address] PCP: Evelena Leyden, DO  Subjective: Chief Complaint  Patient presents with   Left Shoulder - Pain   Right Shoulder - Pain    HPI: Tony Nguyen is a 61 y.o. male who presents to the office reporting bilateral shoulder and arm pain.  He does have significant foraminal stenosis on both sides at C5-6 and C6-7.  This is likely accounting for his radicular arm pain.  Also reports shoulder pain.  He declined an injection in his neck which I think would have been helpful for sorting out how much of his symptoms are coming from the neck versus shoulder.  He has had a MRI scan following his surgery on the right which did show intact rotator cuff.  No recent injuries or falls.  He is having some left shoulder symptoms.  He is also in therapy.  Has physical therapy until 1229..                ROS: All systems reviewed are negative as they relate to the chief complaint within the history of present illness.  Patient denies fevers or chills.  Assessment & Plan: Visit Diagnoses:  1. Chronic left shoulder pain     Plan: Impression is bilateral upper extremity pain.  With numbness and tingling in the ulnar distribution digits 4 and 5 on both hands along with severe foraminal stenosis in the neck at the corresponding levels C6-7 it is likely that the neck is contributing to most of his symptoms.  Although not really supported by examination on the shoulder it is possible that he may have rotator cuff pathology on the left.  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  He has had a prior FCE.  Out of work 4 weeks until we see him back after his MRI arthrogram of the left shoulder.  Continue with therapy.   Strongly recommended neck injection for help with his radicular symptoms but he declined.  If the shoulder MRI scan is negative then really we do not have much more to offer him unless he wants to consider treatment for his neck.  Follow-Up Instructions: No follow-ups on file.   Orders:  Orders Placed This Encounter  Procedures   MR Shoulder Left w/ contrast   Arthrogram   No orders of the defined types were placed in this encounter.     Procedures: No procedures performed   Clinical Data: No additional findings.  Objective: Vital Signs: There were no vitals taken for this visit.  Physical Exam:  Constitutional: Patient appears well-developed HEENT:  Head: Normocephalic Eyes:EOM are normal Neck: Normal range of motion Cardiovascular: Normal rate Pulmonary/chest: Effort normal Neurologic: Patient is alert Skin: Skin is warm Psychiatric: Patient has normal mood and affect  Ortho Exam: Ortho exam demonstrates mildly diminished passive and active range of motion of the neck.  5 out of 5 grip EPL FPL interosseous wrist flexion extension biceps triceps abductor strength.  Range of motion of the shoulder is about 40/95/170.  Very good rotator cuff strength on the left right-hand side infraspinatus supraspinatus subscap muscle testing.  No  discrete AC joint tenderness bilaterally.  No coarse grinding or crepitus on the left with internal/external rotation of the arm.  Specialty Comments:  EXAM: MRI CERVICAL SPINE WITHOUT CONTRAST   TECHNIQUE: Multiplanar, multisequence MR imaging of the cervical spine was performed. No intravenous contrast was administered.   COMPARISON:  Cervical spine radiographs 11/22/2021.   FINDINGS: Alignment: Straightening of the expected cervical lordosis. No significant spondylolisthesis.   Vertebrae: Vertebral body height is maintained. Mild degenerative endplate edema at T5-T7. Hemangiomas within the C7 and T1 vertebral bodies.   Cord: No  signal abnormality identified within the cervical spinal cord.   Posterior Fossa, vertebral arteries, paraspinal tissues: No abnormality identified within included portions of the posterior fossa. Flow voids preserved within the imaged cervical vertebral arteries. No paraspinal mass or collection.   Disc levels:   Multilevel disc degeneration, greatest at C5-C6 (mild-to-moderate), C6-C7 (moderate) and C7-T1 (moderate).   C2-C3: No significant disc herniation or stenosis.   C3-C4: Shallow disc bulge. Minimal uncovertebral hypertrophy on the right. No significant spinal canal or foraminal stenosis.   C4-C5: Shallow disc bulge. Uncovertebral hypertrophy on the right. Mild facet arthrosis. No significant spinal canal stenosis. Mild relative right neural foraminal narrowing.   C5-C6: Disc bulge with bilateral disc osteophyte ridge/uncinate hypertrophy. Mild effacement of the ventral thecal sac (without spinal cord mass effect). Bilateral neural foraminal narrowing (moderate to severe right, moderate left).   C6-C7: Disc bulge with bilateral disc osteophyte ridge/uncinate hypertrophy. Mild effacement of the ventral thecal sac (without spinal cord mass effect). Bilateral neural foraminal narrowing (moderate to severe right, moderate left).   C7-T1: Disc bulge with bilateral disc osteophyte ridge/uncinate hypertrophy. Mild effacement of the ventral thecal sac (without spinal cord mass effect). Severe bilateral neural foraminal narrowing.   IMPRESSION: Cervical spondylosis, as outlined.   No more than mild spinal canal narrowing.   Multilevel foraminal stenosis, greatest bilaterally at C5-C6 (moderate to severe right, moderate left), bilaterally at C6-C7 (moderate to severe right, moderate left) and bilaterally at C7-T1 (severe).   Disc degeneration is greatest at C5-C6 (mild-to-moderate), C6-C7 (moderate) and C7-T1 (moderate). Mild degenerative endplate edema at D2-K0.      Electronically Signed   By: Jackey Loge D.O.   On: 12/20/2021 09:42  Imaging: No results found.   PMFS History: Patient Active Problem List   Diagnosis Date Noted   Unilateral conductive hearing loss 02/26/2021   Pre-syncope 02/26/2021   Complete tear of right rotator cuff    Biceps tendonitis on right    Degenerative superior labral anterior-to-posterior (SLAP) tear of right shoulder    Acute pain of right shoulder 01/11/2020   Blood pressure check 10/20/2019   Mood changes 04/12/2019   Acute midline low back pain without sciatica 02/13/2019   Erectile dysfunction 07/23/2018   Ganglion cyst of dorsum of left wrist 04/20/2018   Prediabetes 04/17/2016   OBESITY, NOS 04/16/2006   Past Medical History:  Diagnosis Date   Bug bite 08/09/2018   Duodenal ulcer due to Helicobacter pylori 09/25/2010   EGD confirmed that duodenal ulcers and H. pylori gastritis in 08/2009. Treated with triple  therapy at [facility].    Gallbladder polyp 09/25/2010   GERD (gastroesophageal reflux disease)    Major depression in remission [facility])    OTHER DISEASES OF NASAL CAVITY AND SINUSES 02/23/2008   Qualifier: Diagnosis of  By: Daphine Deutscher MD, [facility]     RHINITIS, CHRONIC 04/16/2006   Qualifier: Diagnosis of  By: Bebe Shaggy     Screening  for prostate cancer 08/12/2019   TENNIS ELBOW 04/16/2006   Qualifier: Diagnosis of  By: Bebe Shaggy     Tinea corporis 06/01/2017    Family History  Problem Relation Age of Onset   High blood pressure Father    Kidney disease Cousin    Diabetes Neg Hx    Early death Neg Hx    Heart disease Neg Hx    Hyperlipidemia Neg Hx    Hypertension Neg Hx     Past Surgical History:  Procedure Laterality Date   APPENDECTOMY  1981   CHOLECYSTECTOMY  10/15/10   Path results revealed chronic cholecystitis   ELBOW SURGERY  2005   right   SHOULDER ARTHROSCOPY WITH SUBACROMIAL DECOMPRESSION, ROTATOR CUFF REPAIR AND BICEP TENDON REPAIR Right 02/05/2021    Procedure: RIGHT SHOULDER ARTHROSCOPY, SUBACROMIAL DECOMPRESSION, DEBRIDEMENT, MINI OPEN ROTATOR CUFF TEAR REPAIR, BICEPS TENODESIS;  Surgeon: Cammy Copa, MD;  Location: [facility] OR;  Service: Orthopedics;  Laterality: Right;   UPPER GASTROINTESTINAL ENDOSCOPY     Social History   Occupational History   Occupation: Financial planner: [employer]    Comment: [employer]  Tobacco Use   Smoking status: Never   Smokeless tobacco: Never  Vaping Use   Vaping Use: Never used  Substance and Sexual Activity   Alcohol use: No   Drug use: No   Sexual activity: Yes

## 2022-01-23 ENCOUNTER — Encounter: Payer: No Typology Code available for payment source | Admitting: Physical Therapy

## 2022-01-24 ENCOUNTER — Ambulatory Visit (INDEPENDENT_AMBULATORY_CARE_PROVIDER_SITE_OTHER): Payer: No Typology Code available for payment source | Admitting: Physical Medicine and Rehabilitation

## 2022-01-24 DIAGNOSIS — M7918 Myalgia, other site: Secondary | ICD-10-CM | POA: Diagnosis not present

## 2022-01-24 DIAGNOSIS — G8929 Other chronic pain: Secondary | ICD-10-CM

## 2022-01-24 DIAGNOSIS — M25512 Pain in left shoulder: Secondary | ICD-10-CM

## 2022-01-24 DIAGNOSIS — M5412 Radiculopathy, cervical region: Secondary | ICD-10-CM

## 2022-01-24 DIAGNOSIS — M4802 Spinal stenosis, cervical region: Secondary | ICD-10-CM | POA: Diagnosis not present

## 2022-01-24 NOTE — Progress Notes (Unsigned)
Numeric Pain Rating Scale and Functional Assessment Average Pain 7   In the last MONTH (on 0-10 scale) has pain interfered with the following?  1. General activity like being  able to carry out your everyday physical activities such as walking, climbing stairs, carrying groceries, or moving a chair?  Rating(8)   Neck pain is better with PT. Still has pain radiating into left shoulder

## 2022-01-24 NOTE — Progress Notes (Unsigned)
Tony Nguyen - 61 y.o. male MRN [id number]  Date of birth: 08/13/1960  Office Visit Note: Visit Date: 01/24/2022 PCP: Evelena Leyden, DO Referred by: Evelena Leyden, DO  Subjective: Chief Complaint  Patient presents with   Neck - Pain   HPI: Tony Nguyen is a 61 y.o. male who comes in today for evaluation of chronic left sided neck pain radiating to left shoulder and down left upper arm. Pain ongoing since work related accident in July of 2022 where several wooden pallets fell off forklift onto him. His pain is exacerbated by movement and activity, describes as tingling, sore and aching, currently rates as 1 out of 10. Some relief of pain with home exercise regimen, rest and use of medications. Patient continues with formal physical therapy, reports significant relief of pain with these treatments. Recent cervical MRI imaging exhibits multi level degenerative changes and foraminal narrowing, moderate to severe on the right at C5-C6 and C6-C7. No high grade spinal canal stenosis. History of right shoulder arthroscopy by Dr. Dorene Grebe in December of 2022. Patient recently evaluated by Dr. August Saucer for chronic left shoulder pain. Patient denies focal weakness. Patient denies recent trauma or falls.    Review of Systems  Musculoskeletal:  Positive for myalgias and neck pain.  Neurological:  Positive for tingling. Negative for sensory change, focal weakness and weakness.  All other systems reviewed and are negative.  Otherwise per HPI.  Assessment & Plan: Visit Diagnoses:    ICD-10-CM   1. Radiculopathy, cervical region  M54.12     2. Foraminal stenosis of cervical region  M48.02     3. Chronic left shoulder pain  M25.512    G89.29     4. Myofascial pain syndrome  M79.18        Plan: Findings:  Chronic left sided neck pain radiating to left shoulder and down left upper arm. Significant and sustained pain relief with ongoing physical therapy. Patient continues with  conservative therapies as needed such as home exercise regimen, rest and use of medications. Patients clinical presentation and exam are complex, differentials could include cervical radiculopathy as his pain  fits with C5/C6 nerve pattern vs myofascial pain syndrome as he does have tenderness upon palpation of left trapezius region. I spoke with patient regarding treatment plan, he would like to finish physical therapy before considering cervical epidural steroid injection. We are happy to perform injection and can provider pre-procedure sedation with Valium if he is particularly anxious about procedure. Dr. August Saucer has ordered MRI imaging of left shoulder, patient instructed to follow up with him for review and to discuss further treatment. Patient instructed to let us know how he is doing post physical therapy. No red flag symptoms noted upon exam today.     Meds & Orders: No orders of the defined types were placed in this encounter.  No orders of the defined types were placed in this encounter.   Follow-up: Return if symptoms worsen or fail to improve.   Procedures: No procedures performed      Clinical History: EXAM: MRI CERVICAL SPINE WITHOUT CONTRAST   TECHNIQUE: Multiplanar, multisequence MR imaging of the cervical spine was performed. No intravenous contrast was administered.   COMPARISON:  Cervical spine radiographs 11/22/2021.   FINDINGS: Alignment: Straightening of the expected cervical lordosis. No significant spondylolisthesis.   Vertebrae: Vertebral body height is maintained. Mild degenerative endplate edema at X8-P3. Hemangiomas within the C7 and T1 vertebral bodies.   Cord: No signal abnormality identified  within the cervical spinal cord.   Posterior Fossa, vertebral arteries, paraspinal tissues: No abnormality identified within included portions of the posterior fossa. Flow voids preserved within the imaged cervical vertebral arteries. No paraspinal mass or  collection.   Disc levels:   Multilevel disc degeneration, greatest at C5-C6 (mild-to-moderate), C6-C7 (moderate) and C7-T1 (moderate).   C2-C3: No significant disc herniation or stenosis.   C3-C4: Shallow disc bulge. Minimal uncovertebral hypertrophy on the right. No significant spinal canal or foraminal stenosis.   C4-C5: Shallow disc bulge. Uncovertebral hypertrophy on the right. Mild facet arthrosis. No significant spinal canal stenosis. Mild relative right neural foraminal narrowing.   C5-C6: Disc bulge with bilateral disc osteophyte ridge/uncinate hypertrophy. Mild effacement of the ventral thecal sac (without spinal cord mass effect). Bilateral neural foraminal narrowing (moderate to severe right, moderate left).   C6-C7: Disc bulge with bilateral disc osteophyte ridge/uncinate hypertrophy. Mild effacement of the ventral thecal sac (without spinal cord mass effect). Bilateral neural foraminal narrowing (moderate to severe right, moderate left).   C7-T1: Disc bulge with bilateral disc osteophyte ridge/uncinate hypertrophy. Mild effacement of the ventral thecal sac (without spinal cord mass effect). Severe bilateral neural foraminal narrowing.   IMPRESSION: Cervical spondylosis, as outlined.   No more than mild spinal canal narrowing.   Multilevel foraminal stenosis, greatest bilaterally at C5-C6 (moderate to severe right, moderate left), bilaterally at C6-C7 (moderate to severe right, moderate left) and bilaterally at C7-T1 (severe).   Disc degeneration is greatest at C5-C6 (mild-to-moderate), C6-C7 (moderate) and C7-T1 (moderate). Mild degenerative endplate edema at G5-X6.     Electronically Signed   By: Jackey Loge D.O.   On: 12/20/2021 09:42   He reports that he has never smoked. He has never used smokeless tobacco.  Recent Labs    05/31/21 1412  HGBA1C 6.6*    Objective:  VS:  HT:    WT:   BMI:     BP:   HR: bpm  TEMP: ( )  RESP:  Physical  Exam Vitals and nursing note reviewed.  HENT:     Head: Normocephalic and atraumatic.     Right Ear: External ear normal.     Left Ear: External ear normal.     Nose: Nose normal.     Mouth/Throat:     Mouth: Mucous membranes are moist.  Eyes:     Extraocular Movements: Extraocular movements intact.  Cardiovascular:     Rate and Rhythm: Normal rate.     Pulses: Normal pulses.  Pulmonary:     Effort: Pulmonary effort is normal.  Abdominal:     General: Abdomen is flat. There is no distension.  Musculoskeletal:        General: Tenderness present.     Cervical back: Tenderness present.     Comments: Discomfort noted with flexion, extension and side-to-side rotation. Patient has good strength in the upper extremities including 5 out of 5 strength in wrist extension, long finger flexion and APB. There is no atrophy of the hands intrinsically. Sensation intact bilaterally. Dysesthesias noted to left C5/C6 dermatomes. Tenderness noted upon palpation of left trapezius region. Negative Hoffman's sign  Skin:    General: Skin is warm and dry.     Capillary Refill: Capillary refill takes less than 2 seconds.  Neurological:     General: No focal deficit present.     Mental Status: He is alert and oriented to person, place, and time.  Psychiatric:        Mood and Affect:  Mood normal.        Behavior: Behavior normal.     Ortho Exam  Imaging: No results found.  Past Medical/Family/Surgical/Social History: Medications & Allergies reviewed per EMR, new medications updated. Patient Active Problem List   Diagnosis Date Noted   Unilateral conductive hearing loss 02/26/2021   Pre-syncope 02/26/2021   Complete tear of right rotator cuff    Biceps tendonitis on right    Degenerative superior labral anterior-to-posterior (SLAP) tear of right shoulder    Acute pain of right shoulder 01/11/2020   Blood pressure check 10/20/2019   Mood changes 04/12/2019   Acute midline low back pain without  sciatica 02/13/2019   Erectile dysfunction 07/23/2018   Ganglion cyst of dorsum of left wrist 04/20/2018   Prediabetes 04/17/2016   OBESITY, NOS 04/16/2006   Past Medical History:  Diagnosis Date   Bug bite 08/09/2018   Duodenal ulcer due to Helicobacter pylori 09/25/2010   EGD confirmed that duodenal ulcers and H. pylori gastritis in 08/2009. Treated with triple  therapy at [facility].    Gallbladder polyp 09/25/2010   GERD (gastroesophageal reflux disease)    Major depression in remission (HCC)    OTHER DISEASES OF NASAL CAVITY AND SINUSES 02/23/2008   Qualifier: Diagnosis of  By: Daphine Deutscher MD, [facility]     RHINITIS, CHRONIC 04/16/2006   Qualifier: Diagnosis of  By: Bebe Shaggy     Screening for prostate cancer 08/12/2019   TENNIS ELBOW 04/16/2006   Qualifier: Diagnosis of  By: Bebe Shaggy     Tinea corporis 06/01/2017   Family History  Problem Relation Age of Onset   High blood pressure Father    Kidney disease Cousin    Diabetes Neg Hx    Early death Neg Hx    Heart disease Neg Hx    Hyperlipidemia Neg Hx    Hypertension Neg Hx    Past Surgical History:  Procedure Laterality Date   APPENDECTOMY  1981   CHOLECYSTECTOMY  10/15/10   Path results revealed chronic cholecystitis   ELBOW SURGERY  2005   right   SHOULDER ARTHROSCOPY WITH SUBACROMIAL DECOMPRESSION, ROTATOR CUFF REPAIR AND BICEP TENDON REPAIR Right 02/05/2021   Procedure: RIGHT SHOULDER ARTHROSCOPY, SUBACROMIAL DECOMPRESSION, DEBRIDEMENT, MINI OPEN ROTATOR CUFF TEAR REPAIR, BICEPS TENODESIS;  Surgeon: Cammy Copa, MD;  Location: [facility] OR;  Service: Orthopedics;  Laterality: Right;   UPPER GASTROINTESTINAL ENDOSCOPY     Social History   Occupational History   Occupation: Financial planner: [employer]    Comment: [employer]  Tobacco Use   Smoking status: Never   Smokeless tobacco: Never  Vaping Use   Vaping Use: Never used  Substance and Sexual Activity   Alcohol use: No   Drug  use: No   Sexual activity: Yes

## 2022-01-27 ENCOUNTER — Ambulatory Visit (INDEPENDENT_AMBULATORY_CARE_PROVIDER_SITE_OTHER): Payer: No Typology Code available for payment source | Admitting: Physical Therapy

## 2022-01-27 ENCOUNTER — Encounter: Payer: Self-pay | Admitting: Physical Medicine and Rehabilitation

## 2022-01-27 DIAGNOSIS — R600 Localized edema: Secondary | ICD-10-CM | POA: Diagnosis not present

## 2022-01-27 DIAGNOSIS — M6281 Muscle weakness (generalized): Secondary | ICD-10-CM | POA: Diagnosis not present

## 2022-01-27 DIAGNOSIS — G8929 Other chronic pain: Secondary | ICD-10-CM

## 2022-01-27 DIAGNOSIS — M542 Cervicalgia: Secondary | ICD-10-CM

## 2022-01-27 DIAGNOSIS — M25512 Pain in left shoulder: Secondary | ICD-10-CM | POA: Diagnosis not present

## 2022-01-27 NOTE — Therapy (Signed)
OUTPATIENT PHYSICAL THERAPY TREATMENT   Patient Name: Tony Nguyen MRN: [id number] DOB:08-14-1960, 61 y.o., male Today's Date: 01/27/2022  END OF SESSION:  PT End of Session - 01/27/22 1142     Visit Number 3    Number of Visits 10    Date for PT Re-Evaluation 02/27/22    Authorization - Number of Visits 10   per MD referral   PT Start Time 1140    PT Stop Time 1220    PT Time Calculation (min) 40 min    Activity Tolerance Patient tolerated treatment well    Behavior During Therapy [facility] for tasks assessed/performed              Past Medical History:  Diagnosis Date   Bug bite 08/09/2018   Duodenal ulcer due to Helicobacter pylori [id number]   EGD confirmed that duodenal ulcers and H. pylori gastritis in 08/2009. Treated with triple  therapy at [place].    Gallbladder polyp 09/25/2010   GERD (gastroesophageal reflux disease)    Major depression in remission ([place])    OTHER DISEASES OF NASAL CAVITY AND SINUSES 02/23/2008   Qualifier: Diagnosis of  By: Hassell Done MD, [place], CHRONIC 04/16/2006   Qualifier: Diagnosis of  By: Herma Ard     Screening for prostate cancer 08/12/2019   TENNIS ELBOW 04/16/2006   Qualifier: Diagnosis of  By: Herma Ard     Tinea corporis 06/01/2017   Past Surgical History:  Procedure Laterality Date   APPENDECTOMY  1981   CHOLECYSTECTOMY  10/15/10   Path results revealed chronic cholecystitis   ELBOW SURGERY  2005   right   SHOULDER ARTHROSCOPY WITH SUBACROMIAL DECOMPRESSION, ROTATOR CUFF REPAIR AND BICEP TENDON REPAIR Right 02/05/2021   Procedure: RIGHT SHOULDER ARTHROSCOPY, SUBACROMIAL DECOMPRESSION, DEBRIDEMENT, MINI OPEN ROTATOR CUFF TEAR REPAIR, BICEPS TENODESIS;  Surgeon: Meredith Pel, MD;  Location: [place];  Service: Orthopedics;  Laterality: Right;   UPPER GASTROINTESTINAL ENDOSCOPY     Patient Active Problem List   Diagnosis Date Noted   Unilateral conductive hearing loss 02/26/2021    Pre-syncope 02/26/2021   Complete tear of right rotator cuff    Biceps tendonitis on right    Degenerative superior labral anterior-to-posterior (SLAP) tear of right shoulder    Acute pain of right shoulder 01/11/2020   Blood pressure check 10/20/2019   Mood changes 04/12/2019   Acute midline low back pain without sciatica 02/13/2019   Erectile dysfunction 07/23/2018   Ganglion cyst of dorsum of left wrist 04/20/2018   Prediabetes 04/17/2016   OBESITY, NOS 04/16/2006    PCP: Rise Patience, DO   REFERRING PROVIDER: Lorine Bears, NP  REFERRING DIAG: [phone] (ICD-10-CM) - Radiculopathy, cervical region M25.512,G89.29 (ICD-10-CM) - Chronic left shoulder pain M79.18 (ICD-10-CM) - Myofascial pain syndrome M48.02 (ICD-10-CM) - Foraminal stenosis of cervical region  THERAPY DIAG:  Chronic left shoulder pain  Cervicalgia  Muscle weakness (generalized)  Localized edema  Rationale for Evaluation and Treatment: Rehabilitation  ONSET DATE: Pain since July 2022  SUBJECTIVE:  SUBJECTIVE STATEMENT: He relays the pain is not as bad in his neck but the pain is in his left lateral shoulder and down into shoulder blade area.    PERTINENT HISTORY:  s/p Rt shoulder SAD, mini open rotator cuff repair, biceps tenodesis - 02/05/2021  PAIN:  NPRS scale: 8/10 Pain location: left neck and shoulder Pain description: burning pain Aggravating factors: reaching back, lifting arm, pressure on arm Relieving factors: pain ointment, sometimes ice, has not tried heat  PRECAUTIONS: None  WEIGHT BEARING RESTRICTIONS: No  FALLS:  Has patient fallen in last 6 months? No  OCCUPATION: currently out of work, tried to go back but unable to perform, he is a Technical brewer with frequent lifting.    PLOF: Independent  PATIENT GOALS: "to get better"  NEXT MD VISIT:   OBJECTIVE:   DIAGNOSTIC FINDINGS:  01/02/2022 Recent cervical MRI imaging exhibits multi level degenerative changes and foraminal narrowing, moderate to severe on the right at C5-C6 and C6-C7. No high grade spinal canal stenosis. Bilateral upper extremity NCV with EMG performed exhibits mild median nerve entrapment on the left.   PATIENT SURVEYS: 01/02/2022  Eval: FOTO 55% functional  COGNITION: 01/02/2022 Overall cognitive status: Within functional limits for tasks assessed  SENSATION: 01/02/2022 [facility]  POSTURE:  01/02/2022 rounded shoulders and forward head  PALPATION: 01/02/2022 Tender to palpation in lateral left shoulder and upper trap   CERVICAL ROM:   Active ROM A/PROM (deg) 01/02/2022  Flexion 30  Extension 30  Right lateral flexion 20  Left lateral flexion 10  Right rotation 50  Left rotation 45   (Blank rows = not tested)  UPPER EXTREMITY ROM:  Active ROM Right 01/02/2022 Left 01/02/2022  Shoulder flexion  120  Shoulder extension    Shoulder abduction  130  Shoulder adduction    Shoulder extension    Shoulder internal rotation  Reaching behind back to iliac crest  Shoulder external rotation  [facility]  Elbow flexion    Elbow extension    Wrist flexion    Wrist extension    Wrist ulnar deviation    Wrist radial deviation    Wrist pronation    Wrist supination     (Blank rows = not tested)  UPPER EXTREMITY MMT:  MMT Right 01/02/2022 Left 01/02/2022 Right 01/15/2022 Left 01/15/2022  Shoulder flexion 4+ 4 5/5 4/5 c pain  Shoulder extension      Shoulder abduction 4+ 4 5/5 4/5 c pain  Shoulder adduction      Shoulder extension      Shoulder internal rotation 4+ 4+ 5/5 5/5  Shoulder external rotation 5 4+ 5/5 5/5  Middle trapezius      Lower trapezius      Elbow flexion      Elbow extension      Wrist flexion      Wrist extension      Wrist ulnar deviation       Wrist radial deviation      Wrist pronation      Wrist supination      Grip strength       (Blank rows = not tested)  CERVICAL SPECIAL TESTS:  01/02/2022 Spurling's test: Negative inconclusive shoulder impingement signs  FUNCTIONAL TESTS:    TODAY'S TREATMENT:  DATE:  01/27/2022 Therex:  UBE L2 3 min fwd, 3 min retro  Standing blue band rows 2 x 15 c scap retraction  Standing blue band GH ext past legs 3 sec hold 2 x 15  Standing ER c arm at side c towel green band 2 x 10 bilateral, focus on slow movement pattern  Estim  IFC to Lt shoulder to tolerance 15 mins in sitting, no adverse reaction  Vasopnuematic device X 10 min, medium compression, 34 deg to Lt shoulder combined with E stim listed above  TODAY'S TREATMENT:                                                                                                                             DATE:  01/15/2022 Manual:  Percussive device to Lt infraspinatus, Lt upper trap, Lt levator in Rt sidelying for myofascial release techniques (Pt deferred dry needling option)  Therex:  Standing blue band rows 2 x 15 c scap retraction  Standing blue band GH ext past legs 3 sec hold 2 x 10  Standing ER c arm at side c towel green band 2 x 10 bilateral, focus on slow movement pattern  Estim  IFC to Lt shoulder to tolerance 10 mins in sitting, no adverse reaction    TODAY'S TREATMENT:                                                                                                                             DATE:  01/02/2022 HEP creation and review, see below for details -Vasopnuematic device X 10 min, low compression, 34 deg to Lt shoulder -IFC TENS to left shoulder and upper trap X 15 min with vaso    PATIENT EDUCATION: Education details: HEP, PT plan of care Person educated: Patient Education method:  Explanation, Demonstration, Verbal cues, and Handouts Education comprehension: verbalized understanding and needs further education   HOME EXERCISE PROGRAM: Access Code: [email or web address] URL: [email or web address] Date: 01/02/2022 Prepared by: Elsie Ra  Exercises - Supine Chin Tuck  - 2 x daily - 6 x weekly - 1-2 sets - 10 reps - Seated Gentle Upper Trapezius Stretch (Mirrored)  - 2 x daily - 6 x weekly - 1 sets - 3 reps - 20 sec hold - Seated Levator Scapulae Stretch  - 2 x daily - 6 x weekly - 1 sets - 3 reps - 20 sec hold - Standing Single Arm Shoulder Flexion  Stretch on Wall (Mirrored)  - 2 x daily - 6 x weekly - 1 sets - 10 reps - Standing Shoulder Internal Rotation Stretch with Towel  - 2 x daily - 6 x weekly - 1 sets - 10 reps - 5 sec hold - Shoulder External Rotation and Scapular Retraction with Resistance  - 2 x daily - 6 x weekly - 1-2 sets - 10 reps  ASSESSMENT:  CLINICAL IMPRESSION:  Continued to work on shoulder and scapular strengthening as tolerated and used Estim with vasopnumatic at end of session for pain relief.  Continued skilled PT services indicated at this time.    OBJECTIVE IMPAIRMENTS: decreased activity tolerance, decreased shoulder mobility, decreased ROM, decreased strength, impaired flexibility, impaired UE use, postural dysfunction, and pain.  ACTIVITY LIMITATIONS: reaching, lifting, carry,  cleaning, driving, and or occupation  PERSONAL FACTORS: previous Rt shoulder surgery also affecting patient's functional outcome.  REHAB POTENTIAL: Good  CLINICAL DECISION MAKING: Stable/uncomplicated  EVALUATION COMPLEXITY: Low    GOALS: Short term PT Goals Target date: 01/30/2022 Pt will be I and compliant with HEP. Baseline:  Goal status: on going 01/15/2022 Pt will decrease pain by 25% overall Baseline: Goal status: on going 01/15/2022  Long term PT goals Target date: 03/13/2022 Pt will improve Rt shoulder AROM to [facility] to improve functional  reaching Baseline: Goal status: New Pt will improve  Rt shoulder strength to at least 4+/5 MMT to improve functional strength Baseline: Goal status: New Pt will improve FOTO to at least 66% functional to show improved function Baseline: Goal status: New Pt will reduce pain to overall less than 3/10 with usual activity and work activity. Baseline: Goal status: New  PLAN: PT FREQUENCY: 1-2 times per week   PT DURATION: 8-10 weeks  PLANNED INTERVENTIONS (unless contraindicated): aquatic PT, Canalith repositioning, cryotherapy, Electrical stimulation, Iontophoresis with 4 mg/ml dexamethasome, Moist heat, traction, Ultrasound, gait training, Therapeutic exercise, balance training, neuromuscular re-education, patient/family education, prosthetic training, manual techniques, passive ROM, dry needling, taping, vasopnuematic device, vestibular, spinal manipulations, joint manipulations  PLAN FOR NEXT SESSION:  Progressive strengthening for shoulders.  Estim and or vaso as desired.   Elsie Ra, PT, DPT 01/27/22 12:08 PM

## 2022-01-30 ENCOUNTER — Encounter: Payer: No Typology Code available for payment source | Admitting: Physical Therapy

## 2022-01-31 ENCOUNTER — Encounter: Payer: Self-pay | Admitting: Physical Therapy

## 2022-01-31 ENCOUNTER — Ambulatory Visit (INDEPENDENT_AMBULATORY_CARE_PROVIDER_SITE_OTHER): Payer: No Typology Code available for payment source | Admitting: Physical Therapy

## 2022-01-31 DIAGNOSIS — M25512 Pain in left shoulder: Secondary | ICD-10-CM

## 2022-01-31 DIAGNOSIS — M542 Cervicalgia: Secondary | ICD-10-CM

## 2022-01-31 DIAGNOSIS — R600 Localized edema: Secondary | ICD-10-CM | POA: Diagnosis not present

## 2022-01-31 DIAGNOSIS — M6281 Muscle weakness (generalized): Secondary | ICD-10-CM

## 2022-01-31 DIAGNOSIS — G8929 Other chronic pain: Secondary | ICD-10-CM

## 2022-01-31 NOTE — Therapy (Signed)
OUTPATIENT PHYSICAL THERAPY TREATMENT   Patient Name: Tony Nguyen MRN: [id number] DOB:October 01, 1960, 61 y.o., male Today's Date: 01/31/2022  END OF SESSION:  PT End of Session - 01/31/22 0935     Visit Number 4    Number of Visits 10    Date for PT Re-Evaluation 02/27/22    Authorization - Number of Visits 10   per MD referral   PT Start Time 0930    PT Stop Time 1010    PT Time Calculation (min) 40 min    Activity Tolerance Patient tolerated treatment well    Behavior During Therapy [facility] for tasks assessed/performed              Past Medical History:  Diagnosis Date   Bug bite 08/09/2018   Duodenal ulcer due to Helicobacter pylori 94/85/4627   EGD confirmed that duodenal ulcers and H. pylori gastritis in 08/2009. Treated with triple  therapy at [place].    Gallbladder polyp 09/25/2010   GERD (gastroesophageal reflux disease)    Major depression in remission ([place])    OTHER DISEASES OF NASAL CAVITY AND SINUSES 02/23/2008   Qualifier: Diagnosis of  By: Hassell Done MD, [place], CHRONIC 04/16/2006   Qualifier: Diagnosis of  By: Herma Ard     Screening for prostate cancer 08/12/2019   TENNIS ELBOW 04/16/2006   Qualifier: Diagnosis of  By: Herma Ard     Tinea corporis 06/01/2017   Past Surgical History:  Procedure Laterality Date   APPENDECTOMY  1981   CHOLECYSTECTOMY  10/15/10   Path results revealed chronic cholecystitis   ELBOW SURGERY  2005   right   SHOULDER ARTHROSCOPY WITH SUBACROMIAL DECOMPRESSION, ROTATOR CUFF REPAIR AND BICEP TENDON REPAIR Right 02/05/2021   Procedure: RIGHT SHOULDER ARTHROSCOPY, SUBACROMIAL DECOMPRESSION, DEBRIDEMENT, MINI OPEN ROTATOR CUFF TEAR REPAIR, BICEPS TENODESIS;  Surgeon: Meredith Pel, MD;  Location: [place];  Service: Orthopedics;  Laterality: Right;   UPPER GASTROINTESTINAL ENDOSCOPY     Patient Active Problem List   Diagnosis Date Noted   Unilateral conductive hearing loss 02/26/2021    Pre-syncope 02/26/2021   Complete tear of right rotator cuff    Biceps tendonitis on right    Degenerative superior labral anterior-to-posterior (SLAP) tear of right shoulder    Acute pain of right shoulder 01/11/2020   Blood pressure check 10/20/2019   Mood changes 04/12/2019   Acute midline low back pain without sciatica 02/13/2019   Erectile dysfunction 07/23/2018   Ganglion cyst of dorsum of left wrist 04/20/2018   Prediabetes 04/17/2016   OBESITY, NOS 04/16/2006    PCP: Rise Patience, DO   REFERRING PROVIDER: Lorine Bears, NP  REFERRING DIAG: [phone] (ICD-10-CM) - Radiculopathy, cervical region M25.512,G89.29 (ICD-10-CM) - Chronic left shoulder pain M79.18 (ICD-10-CM) - Myofascial pain syndrome M48.02 (ICD-10-CM) - Foraminal stenosis of cervical region  THERAPY DIAG:  Chronic left shoulder pain  Cervicalgia  Muscle weakness (generalized)  Localized edema  Rationale for Evaluation and Treatment: Rehabilitation  ONSET DATE: Pain since July 2022  SUBJECTIVE:  SUBJECTIVE STATEMENT: He relays the pain is not so bad today, less overall  PERTINENT HISTORY:  s/p Rt shoulder SAD, mini open rotator cuff repair, biceps tenodesis - 02/05/2021  PAIN:  NPRS scale: 4/10 Pain location: left neck and shoulder Pain description: burning pain Aggravating factors: reaching back, lifting arm, pressure on arm Relieving factors: pain ointment, sometimes ice, has not tried heat  PRECAUTIONS: None  WEIGHT BEARING RESTRICTIONS: No  FALLS:  Has patient fallen in last 6 months? No  OCCUPATION: currently out of work, tried to go back but unable to perform, he is a Production designer, theatre/television/film with frequent lifting.   PLOF: Independent  PATIENT GOALS: "to get better"  NEXT MD VISIT:    OBJECTIVE:   DIAGNOSTIC FINDINGS:  01/02/2022 Recent cervical MRI imaging exhibits multi level degenerative changes and foraminal narrowing, moderate to severe on the right at C5-C6 and C6-C7. No high grade spinal canal stenosis. Bilateral upper extremity NCV with EMG performed exhibits mild median nerve entrapment on the left.   PATIENT SURVEYS: 01/02/2022  Eval: FOTO 55% functional  COGNITION: 01/02/2022 Overall cognitive status: Within functional limits for tasks assessed  SENSATION: 01/02/2022 [facility]  POSTURE:  01/02/2022 rounded shoulders and forward head  PALPATION: 01/02/2022 Tender to palpation in lateral left shoulder and upper trap   CERVICAL ROM:   Active ROM AROM (deg) 01/02/2022 AROM 01/31/22  Flexion 30 40  Extension 30 40  Right lateral flexion 20 30  Left lateral flexion 10 30  Right rotation 50 60  Left rotation 45 60   (Blank rows = not tested)  UPPER EXTREMITY ROM:  Active ROM Right 01/02/2022 Left 01/02/2022 Left 01/31/22  Shoulder flexion  120 130  Shoulder extension     Shoulder abduction  130 140  Shoulder adduction     Shoulder extension     Shoulder internal rotation  Reaching behind back to iliac crest Reaching behind back to Lt SIJ  Shoulder external rotation  [facility]  Elbow flexion     Elbow extension     Wrist flexion     Wrist extension     Wrist ulnar deviation     Wrist radial deviation     Wrist pronation     Wrist supination      (Blank rows = not tested)  UPPER EXTREMITY MMT:  MMT Right 01/02/2022 Left 01/02/2022 Right 01/15/2022 Left 01/15/2022  Shoulder flexion 4+ 4 5/5 4/5 c pain  Shoulder extension      Shoulder abduction 4+ 4 5/5 4/5 c pain  Shoulder adduction      Shoulder extension      Shoulder internal rotation 4+ 4+ 5/5 5/5  Shoulder external rotation 5 4+ 5/5 5/5  Middle trapezius      Lower trapezius      Elbow flexion      Elbow extension      Wrist flexion      Wrist extension      Wrist  ulnar deviation      Wrist radial deviation      Wrist pronation      Wrist supination      Grip strength       (Blank rows = not tested)  CERVICAL SPECIAL TESTS:  01/02/2022 Spurling's test: Negative inconclusive shoulder impingement signs  FUNCTIONAL TESTS:   TODAY'S TREATMENT:  DATE:  01/31/2022 Therex:  UBE L2 3 min fwd, 3 min retro  Standing IR stretch behind back on left 5 sec X 10  Standing blue band rows 2 x 15 c scap retraction  Standing blue band GH ext past legs 3 sec hold 2 x 15  Standing ER c arm at side c towel green band 2 x 10 bilateral, focus on slow movement pattern  Estim  IFC to Lt shoulder to tolerance 15 mins in sitting, no adverse reaction  Vasopnuematic device X 10 min, medium compression, 34 deg to Lt shoulder combined with E stim listed above TODAY'S TREATMENT:                                                                                                                             DATE:  01/27/2022 Therex:  UBE L2 3 min fwd, 3 min retro  Standing blue band rows 2 x 15 c scap retraction  Standing blue band GH ext past legs 3 sec hold 2 x 15  Standing ER c arm at side c towel green band 2 x 10 bilateral, focus on slow movement pattern  Estim  IFC to Lt shoulder to tolerance 15 mins in sitting, no adverse reaction  Vasopnuematic device X 10 min, medium compression, 34 deg to Lt shoulder combined with E stim listed above   PATIENT EDUCATION: Education details: HEP, PT plan of care Person educated: Patient Education method: Explanation, Demonstration, Verbal cues, and Handouts Education comprehension: verbalized understanding and needs further education   HOME EXERCISE PROGRAM: Access Code: [email or web address] URL: [email or web address] Date: 01/02/2022 Prepared by: Elsie Ra  Exercises - Supine Chin Tuck  - 2 x  daily - 6 x weekly - 1-2 sets - 10 reps - Seated Gentle Upper Trapezius Stretch (Mirrored)  - 2 x daily - 6 x weekly - 1 sets - 3 reps - 20 sec hold - Seated Levator Scapulae Stretch  - 2 x daily - 6 x weekly - 1 sets - 3 reps - 20 sec hold - Standing Single Arm Shoulder Flexion Stretch on Wall (Mirrored)  - 2 x daily - 6 x weekly - 1 sets - 10 reps - Standing Shoulder Internal Rotation Stretch with Towel  - 2 x daily - 6 x weekly - 1 sets - 10 reps - 5 sec hold - Shoulder External Rotation and Scapular Retraction with Resistance  - 2 x daily - 6 x weekly - 1-2 sets - 10 reps  ASSESSMENT:  CLINICAL IMPRESSION:  He showed good improvement with neck ROM measurements today. He does still lack some Lt shoulder flexion ROM, left shoulder IR ROM, and overall shoulder strength so we continued to work to improve this with PT. He has now met Short term Goals but not yet Long term goals for PT.   OBJECTIVE IMPAIRMENTS: decreased activity tolerance, decreased shoulder mobility, decreased ROM, decreased strength, impaired flexibility, impaired UE use, postural dysfunction, and pain.  ACTIVITY  LIMITATIONS: reaching, lifting, carry,  cleaning, driving, and or occupation  PERSONAL FACTORS: previous Rt shoulder surgery also affecting patient's functional outcome.  REHAB POTENTIAL: Good  CLINICAL DECISION MAKING: Stable/uncomplicated  EVALUATION COMPLEXITY: Low    GOALS: Short term PT Goals Target date: 01/30/2022 Pt will be I and compliant with HEP. Baseline:  Goal status: MET 01/31/22 Pt will decrease pain by 25% overall Baseline: Goal status: MET 01/31/22  Long term PT goals Target date: 03/13/2022 Pt will improve Rt shoulder AROM to [facility] to improve functional reaching Baseline: Goal status: ongoing Pt will improve  Rt shoulder strength to at least 4+/5 MMT to improve functional strength Baseline: Goal status: ongoing Pt will improve FOTO to at least 66% functional to show improved  function Baseline: Goal status: ongoing Pt will reduce pain to overall less than 3/10 with usual activity and work activity. Baseline: Goal status: ongoing  PLAN: PT FREQUENCY: 1-2 times per week   PT DURATION: 8-10 weeks  PLANNED INTERVENTIONS (unless contraindicated): aquatic PT, Canalith repositioning, cryotherapy, Electrical stimulation, Iontophoresis with 4 mg/ml dexamethasome, Moist heat, traction, Ultrasound, gait training, Therapeutic exercise, balance training, neuromuscular re-education, patient/family education, prosthetic training, manual techniques, passive ROM, dry needling, taping, vasopnuematic device, vestibular, spinal manipulations, joint manipulations  PLAN FOR NEXT SESSION:  Progressive strengthening for shoulders.  Estim and or vaso as desired.   Elsie Ra, PT, DPT 01/31/22 9:35 AM

## 2022-02-03 ENCOUNTER — Encounter: Payer: Self-pay | Admitting: Physical Therapy

## 2022-02-03 ENCOUNTER — Ambulatory Visit (INDEPENDENT_AMBULATORY_CARE_PROVIDER_SITE_OTHER): Payer: No Typology Code available for payment source | Admitting: Physical Therapy

## 2022-02-03 DIAGNOSIS — M542 Cervicalgia: Secondary | ICD-10-CM

## 2022-02-03 DIAGNOSIS — M25512 Pain in left shoulder: Secondary | ICD-10-CM

## 2022-02-03 DIAGNOSIS — M6281 Muscle weakness (generalized): Secondary | ICD-10-CM | POA: Diagnosis not present

## 2022-02-03 DIAGNOSIS — R600 Localized edema: Secondary | ICD-10-CM | POA: Diagnosis not present

## 2022-02-03 DIAGNOSIS — G8929 Other chronic pain: Secondary | ICD-10-CM

## 2022-02-03 NOTE — Therapy (Signed)
OUTPATIENT PHYSICAL THERAPY TREATMENT   Patient Name: Tony Nguyen MRN: [id number] DOB:[birth year], 61 y.o., male Today's Date: 02/03/2022  END OF SESSION:  PT End of Session - 02/03/22 1147     Visit Number 5    Number of Visits 10    Date for PT Re-Evaluation 02/27/22    Authorization - Number of Visits 10   per MD referral   PT Start Time 1142    PT Stop Time 1222    PT Time Calculation (min) 40 min    Activity Tolerance Patient tolerated treatment well    Behavior During Therapy [facility] for tasks assessed/performed              Past Medical History:  Diagnosis Date   Bug bite 08/09/2018   Duodenal ulcer due to Helicobacter pylori 03/52/4818   EGD confirmed that duodenal ulcers and H. pylori gastritis in 08/2009. Treated with triple  therapy at [place].    Gallbladder polyp 09/25/2010   GERD (gastroesophageal reflux disease)    Major depression in remission ([place])    OTHER DISEASES OF NASAL CAVITY AND SINUSES 02/23/2008   Qualifier: Diagnosis of  By: Hassell Done MD, [place], CHRONIC 04/16/2006   Qualifier: Diagnosis of  By: Herma Ard     Screening for prostate cancer 08/12/2019   TENNIS ELBOW 04/16/2006   Qualifier: Diagnosis of  By: Herma Ard     Tinea corporis 06/01/2017   Past Surgical History:  Procedure Laterality Date   APPENDECTOMY  1981   CHOLECYSTECTOMY  10/15/10   Path results revealed chronic cholecystitis   ELBOW SURGERY  2005   right   SHOULDER ARTHROSCOPY WITH SUBACROMIAL DECOMPRESSION, ROTATOR CUFF REPAIR AND BICEP TENDON REPAIR Right 02/05/2021   Procedure: RIGHT SHOULDER ARTHROSCOPY, SUBACROMIAL DECOMPRESSION, DEBRIDEMENT, MINI OPEN ROTATOR CUFF TEAR REPAIR, BICEPS TENODESIS;  Surgeon: Meredith Pel, MD;  Location: [place];  Service: Orthopedics;  Laterality: Right;   UPPER GASTROINTESTINAL ENDOSCOPY     Patient Active Problem List   Diagnosis Date Noted   Unilateral conductive hearing loss 02/26/2021    Pre-syncope 02/26/2021   Complete tear of right rotator cuff    Biceps tendonitis on right    Degenerative superior labral anterior-to-posterior (SLAP) tear of right shoulder    Acute pain of right shoulder 01/11/2020   Blood pressure check 10/20/2019   Mood changes 04/12/2019   Acute midline low back pain without sciatica 02/13/2019   Erectile dysfunction 07/23/2018   Ganglion cyst of dorsum of left wrist 04/20/2018   Prediabetes 04/17/2016   OBESITY, NOS 04/16/2006    PCP: Rise Patience, DO   REFERRING PROVIDER: Lorine Bears, NP  REFERRING DIAG: [phone] (ICD-10-CM) - Radiculopathy, cervical region M25.512,G89.29 (ICD-10-CM) - Chronic left shoulder pain M79.18 (ICD-10-CM) - Myofascial pain syndrome M48.02 (ICD-10-CM) - Foraminal stenosis of cervical region  THERAPY DIAG:  Chronic left shoulder pain  Cervicalgia  Localized edema  Muscle weakness (generalized)  Rationale for Evaluation and Treatment: Rehabilitation  ONSET DATE: Pain since July 2022  SUBJECTIVE:  SUBJECTIVE STATEMENT: He relays the pain is better today  PERTINENT HISTORY:  s/p Rt shoulder SAD, mini open rotator cuff repair, biceps tenodesis - 02/05/2021  PAIN:  NPRS scale: 2/10 Pain location: left neck and shoulder Pain description: burning pain Aggravating factors: reaching back, lifting arm, pressure on arm Relieving factors: pain ointment, sometimes ice, has not tried heat  PRECAUTIONS: None  WEIGHT BEARING RESTRICTIONS: No  FALLS:  Has patient fallen in last 6 months? No  OCCUPATION: currently out of work, tried to go back but unable to perform, he is a Production designer, theatre/television/film with frequent lifting.   PLOF: Independent  PATIENT GOALS: "to get better"  NEXT MD VISIT:   OBJECTIVE:    DIAGNOSTIC FINDINGS:  01/02/2022 Recent cervical MRI imaging exhibits multi level degenerative changes and foraminal narrowing, moderate to severe on the right at C5-C6 and C6-C7. No high grade spinal canal stenosis. Bilateral upper extremity NCV with EMG performed exhibits mild median nerve entrapment on the left.   PATIENT SURVEYS: 01/02/2022  Eval: FOTO 55% functional  COGNITION: 01/02/2022 Overall cognitive status: Within functional limits for tasks assessed  SENSATION: 01/02/2022 [facility]  POSTURE:  01/02/2022 rounded shoulders and forward head  PALPATION: 01/02/2022 Tender to palpation in lateral left shoulder and upper trap   CERVICAL ROM:   Active ROM AROM (deg) 01/02/2022 AROM 01/31/22  Flexion 30 40  Extension 30 40  Right lateral flexion 20 30  Left lateral flexion 10 30  Right rotation 50 60  Left rotation 45 60   (Blank rows = not tested)  UPPER EXTREMITY ROM:  Active ROM Right 01/02/2022 Left 01/02/2022 Left 01/31/22  Shoulder flexion  120 130  Shoulder extension     Shoulder abduction  130 140  Shoulder adduction     Shoulder extension     Shoulder internal rotation  Reaching behind back to iliac crest Reaching behind back to Lt SIJ  Shoulder external rotation  [facility]  Elbow flexion     Elbow extension     Wrist flexion     Wrist extension     Wrist ulnar deviation     Wrist radial deviation     Wrist pronation     Wrist supination      (Blank rows = not tested)  UPPER EXTREMITY MMT:  MMT Right 01/02/2022 Left 01/02/2022 Right 01/15/2022 Left 01/15/2022  Shoulder flexion 4+ 4 5/5 4/5 c pain  Shoulder extension      Shoulder abduction 4+ 4 5/5 4/5 c pain  Shoulder adduction      Shoulder extension      Shoulder internal rotation 4+ 4+ 5/5 5/5  Shoulder external rotation 5 4+ 5/5 5/5  Middle trapezius      Lower trapezius      Elbow flexion      Elbow extension      Wrist flexion      Wrist extension      Wrist ulnar  deviation      Wrist radial deviation      Wrist pronation      Wrist supination      Grip strength       (Blank rows = not tested)  CERVICAL SPECIAL TESTS:  01/02/2022 Spurling's test: Negative inconclusive shoulder impingement signs  FUNCTIONAL TESTS:   TODAY'S TREATMENT:  DATE:  02/03/2022 Therex:  UBE L2 3 min fwd, 3 min retro  Standing IR stretch behind back on left 5 sec X 10  Standing Horizontal abd green 2X10  Standing blue band rows 2 x 15 c scap retraction  Standing blue band GH ext past legs 3 sec hold 2 x 15  Standing ER c arm at side c towel green band 2 x 10 bilateral, focus on slow movement pattern  Estim  IFC to Lt shoulder to tolerance 12 mins in sitting, no adverse reaction  Vasopnuematic device X 10 min, medium compression, 34 deg to Lt shoulder combined with E stim listed above  TODAY'S TREATMENT:                                                                                                                             DATE:  01/31/2022 Therex:  UBE L2 3 min fwd, 3 min retro  Standing IR stretch behind back on left 5 sec X 10  Standing blue band rows 2 x 15 c scap retraction  Standing blue band GH ext past legs 3 sec hold 2 x 15  Standing ER c arm at side c towel green band 2 x 10 bilateral, focus on slow movement pattern  Estim  IFC to Lt shoulder to tolerance 15 mins in sitting, no adverse reaction  Vasopnuematic device X 10 min, medium compression, 34 deg to Lt shoulder combined with E stim listed above   PATIENT EDUCATION: Education details: HEP, PT plan of care Person educated: Patient Education method: Explanation, Demonstration, Verbal cues, and Handouts Education comprehension: verbalized understanding and needs further education   HOME EXERCISE PROGRAM: Access Code: [email or web address] URL:  [email or web address] Date: 01/02/2022 Prepared by: Elsie Ra  Exercises - Supine Chin Tuck  - 2 x daily - 6 x weekly - 1-2 sets - 10 reps - Seated Gentle Upper Trapezius Stretch (Mirrored)  - 2 x daily - 6 x weekly - 1 sets - 3 reps - 20 sec hold - Seated Levator Scapulae Stretch  - 2 x daily - 6 x weekly - 1 sets - 3 reps - 20 sec hold - Standing Single Arm Shoulder Flexion Stretch on Wall (Mirrored)  - 2 x daily - 6 x weekly - 1 sets - 10 reps - Standing Shoulder Internal Rotation Stretch with Towel  - 2 x daily - 6 x weekly - 1 sets - 10 reps - 5 sec hold - Shoulder External Rotation and Scapular Retraction with Resistance  - 2 x daily - 6 x weekly - 1-2 sets - 10 reps  ASSESSMENT:  CLINICAL IMPRESSION:  He has had less overall pain over the last 2 sessions and having improved activity tolerance and ROM for his neck and left shoulder. PT recommending to continue with current plan of care.    OBJECTIVE IMPAIRMENTS: decreased activity tolerance, decreased shoulder mobility, decreased ROM, decreased strength, impaired flexibility, impaired UE use, postural dysfunction, and pain.  ACTIVITY LIMITATIONS: reaching, lifting, carry,  cleaning, driving, and or occupation  PERSONAL FACTORS: previous Rt shoulder surgery also affecting patient's functional outcome.  REHAB POTENTIAL: Good  CLINICAL DECISION MAKING: Stable/uncomplicated  EVALUATION COMPLEXITY: Low    GOALS: Short term PT Goals Target date: 01/30/2022 Pt will be I and compliant with HEP. Baseline:  Goal status: MET 01/31/22 Pt will decrease pain by 25% overall Baseline: Goal status: MET 01/31/22  Long term PT goals Target date: 03/13/2022 Pt will improve Rt shoulder AROM to [facility] to improve functional reaching Baseline: Goal status: ongoing Pt will improve  Rt shoulder strength to at least 4+/5 MMT to improve functional strength Baseline: Goal status: ongoing Pt will improve FOTO to at least 66%  functional to show improved function Baseline: Goal status: ongoing Pt will reduce pain to overall less than 3/10 with usual activity and work activity. Baseline: Goal status: ongoing  PLAN: PT FREQUENCY: 1-2 times per week   PT DURATION: 8-10 weeks  PLANNED INTERVENTIONS (unless contraindicated): aquatic PT, Canalith repositioning, cryotherapy, Electrical stimulation, Iontophoresis with 4 mg/ml dexamethasome, Moist heat, traction, Ultrasound, gait training, Therapeutic exercise, balance training, neuromuscular re-education, patient/family education, prosthetic training, manual techniques, passive ROM, dry needling, taping, vasopnuematic device, vestibular, spinal manipulations, joint manipulations  PLAN FOR NEXT SESSION:  Progressive strengthening for shoulders.  Estim and or vaso as desired.   Elsie Ra, PT, DPT 02/03/22 11:48 AM

## 2022-02-06 ENCOUNTER — Ambulatory Visit (INDEPENDENT_AMBULATORY_CARE_PROVIDER_SITE_OTHER): Payer: No Typology Code available for payment source | Admitting: Physical Therapy

## 2022-02-06 ENCOUNTER — Encounter: Payer: Self-pay | Admitting: Physical Therapy

## 2022-02-06 DIAGNOSIS — M6281 Muscle weakness (generalized): Secondary | ICD-10-CM

## 2022-02-06 DIAGNOSIS — M25512 Pain in left shoulder: Secondary | ICD-10-CM

## 2022-02-06 DIAGNOSIS — R600 Localized edema: Secondary | ICD-10-CM

## 2022-02-06 DIAGNOSIS — M542 Cervicalgia: Secondary | ICD-10-CM | POA: Diagnosis not present

## 2022-02-06 DIAGNOSIS — G8929 Other chronic pain: Secondary | ICD-10-CM

## 2022-02-06 NOTE — Therapy (Signed)
OUTPATIENT PHYSICAL THERAPY TREATMENT   Patient Name: Tony Nguyen MRN: [id number] DOB:[birth year], 61 y.o., male Today's Date: 02/06/2022  END OF SESSION:  PT End of Session - 02/06/22 1110     Visit Number 6    Number of Visits 10    Date for PT Re-Evaluation 02/27/22    Authorization - Number of Visits 10   per MD referral   PT Start Time 1055    PT Stop Time 1135    PT Time Calculation (min) 40 min    Activity Tolerance Patient tolerated treatment well    Behavior During Therapy [facility] for tasks assessed/performed              Past Medical History:  Diagnosis Date   Bug bite 08/09/2018   Duodenal ulcer due to Helicobacter pylori 29/93/7169   EGD confirmed that duodenal ulcers and H. pylori gastritis in 08/2009. Treated with triple  therapy at [place].    Gallbladder polyp 09/25/2010   GERD (gastroesophageal reflux disease)    Major depression in remission ([place])    OTHER DISEASES OF NASAL CAVITY AND SINUSES 02/23/2008   Qualifier: Diagnosis of  By: Hassell Done MD, [place], CHRONIC 04/16/2006   Qualifier: Diagnosis of  By: Herma Ard     Screening for prostate cancer 08/12/2019   TENNIS ELBOW 04/16/2006   Qualifier: Diagnosis of  By: Herma Ard     Tinea corporis 06/01/2017   Past Surgical History:  Procedure Laterality Date   APPENDECTOMY  1981   CHOLECYSTECTOMY  10/15/10   Path results revealed chronic cholecystitis   ELBOW SURGERY  2005   right   SHOULDER ARTHROSCOPY WITH SUBACROMIAL DECOMPRESSION, ROTATOR CUFF REPAIR AND BICEP TENDON REPAIR Right 02/05/2021   Procedure: RIGHT SHOULDER ARTHROSCOPY, SUBACROMIAL DECOMPRESSION, DEBRIDEMENT, MINI OPEN ROTATOR CUFF TEAR REPAIR, BICEPS TENODESIS;  Surgeon: Meredith Pel, MD;  Location: [place];  Service: Orthopedics;  Laterality: Right;   UPPER GASTROINTESTINAL ENDOSCOPY     Patient Active Problem List   Diagnosis Date Noted   Unilateral conductive hearing loss 02/26/2021    Pre-syncope 02/26/2021   Complete tear of right rotator cuff    Biceps tendonitis on right    Degenerative superior labral anterior-to-posterior (SLAP) tear of right shoulder    Acute pain of right shoulder 01/11/2020   Blood pressure check 10/20/2019   Mood changes 04/12/2019   Acute midline low back pain without sciatica 02/13/2019   Erectile dysfunction 07/23/2018   Ganglion cyst of dorsum of left wrist 04/20/2018   Prediabetes 04/17/2016   OBESITY, NOS 04/16/2006    PCP: Rise Patience, DO   REFERRING PROVIDER: Lorine Bears, NP  REFERRING DIAG: [phone] (ICD-10-CM) - Radiculopathy, cervical region M25.512,G89.29 (ICD-10-CM) - Chronic left shoulder pain M79.18 (ICD-10-CM) - Myofascial pain syndrome M48.02 (ICD-10-CM) - Foraminal stenosis of cervical region  THERAPY DIAG:  Chronic left shoulder pain  Cervicalgia  Localized edema  Muscle weakness (generalized)  Rationale for Evaluation and Treatment: Rehabilitation  ONSET DATE: Pain since July 2022  SUBJECTIVE:  SUBJECTIVE STATEMENT: He relays the pain is okay staying about 2/10 now  PERTINENT HISTORY:  s/p Rt shoulder SAD, mini open rotator cuff repair, biceps tenodesis - 02/05/2021  PAIN:  NPRS scale: 2/10 Pain location: left neck and shoulder Pain description: burning pain Aggravating factors: reaching back, lifting arm, pressure on arm Relieving factors: pain ointment, sometimes ice, has not tried heat  PRECAUTIONS: None  WEIGHT BEARING RESTRICTIONS: No  FALLS:  Has patient fallen in last 6 months? No  OCCUPATION: currently out of work, tried to go back but unable to perform, he is a Production designer, theatre/television/film with frequent lifting.   PLOF: Independent  PATIENT GOALS: "to get better"  NEXT MD VISIT:    OBJECTIVE:   DIAGNOSTIC FINDINGS:  01/02/2022 Recent cervical MRI imaging exhibits multi level degenerative changes and foraminal narrowing, moderate to severe on the right at C5-C6 and C6-C7. No high grade spinal canal stenosis. Bilateral upper extremity NCV with EMG performed exhibits mild median nerve entrapment on the left.   PATIENT SURVEYS: 01/02/2022  Eval: FOTO 55% functional  COGNITION: 01/02/2022 Overall cognitive status: Within functional limits for tasks assessed  SENSATION: 01/02/2022 [facility]  POSTURE:  01/02/2022 rounded shoulders and forward head  PALPATION: 01/02/2022 Tender to palpation in lateral left shoulder and upper trap   CERVICAL ROM:   Active ROM AROM (deg) 01/02/2022 AROM 01/31/22  Flexion 30 40  Extension 30 40  Right lateral flexion 20 30  Left lateral flexion 10 30  Right rotation 50 60  Left rotation 45 60   (Blank rows = not tested)  UPPER EXTREMITY ROM:  Active ROM Right 01/02/2022 Left 01/02/2022 Left 01/31/22  Shoulder flexion  120 130  Shoulder extension     Shoulder abduction  130 140  Shoulder adduction     Shoulder extension     Shoulder internal rotation  Reaching behind back to iliac crest Reaching behind back to Lt SIJ  Shoulder external rotation  [facility]  Elbow flexion     Elbow extension     Wrist flexion     Wrist extension     Wrist ulnar deviation     Wrist radial deviation     Wrist pronation     Wrist supination      (Blank rows = not tested)  UPPER EXTREMITY MMT:  MMT Right 01/02/2022 Left 01/02/2022 Right 01/15/2022 Left 01/15/2022  Shoulder flexion 4+ 4 5/5 4/5 c pain  Shoulder extension      Shoulder abduction 4+ 4 5/5 4/5 c pain  Shoulder adduction      Shoulder extension      Shoulder internal rotation 4+ 4+ 5/5 5/5  Shoulder external rotation 5 4+ 5/5 5/5  Middle trapezius      Lower trapezius      Elbow flexion      Elbow extension      Wrist flexion      Wrist extension      Wrist  ulnar deviation      Wrist radial deviation      Wrist pronation      Wrist supination      Grip strength       (Blank rows = not tested)  CERVICAL SPECIAL TESTS:  01/02/2022 Spurling's test: Negative inconclusive shoulder impingement signs  FUNCTIONAL TESTS:   TODAY'S TREATMENT:  DATE:  02/06/2022 Therex:  UBE L3 3 min fwd, 3 min retro  Standing IR stretch behind back on left 5 sec X 10  Standing Horizontal abd green 2X10  Standing blue band rows 2 x 15 c scap retraction  Standing blue band GH ext past legs 3 sec hold 2 x 15  Standing ER bilat with green 2X10  Chest press machine 35# 2X10  Row machine 45# 2X10  Lat pulldown machine 45# 2X10   Estim  IFC to Lt shoulder to tolerance 15 mins in sitting, no adverse reaction  Vasopnuematic device X 10 min, medium compression, 34 deg to Lt shoulder combined with E stim listed above   TODAY'S TREATMENT:                                                                                                                             DATE:  02/03/2022 Therex:  UBE L2 3 min fwd, 3 min retro  Standing IR stretch behind back on left 5 sec X 10  Standing Horizontal abd green 2X10  Standing blue band rows 2 x 15 c scap retraction  Standing blue band GH ext past legs 3 sec hold 2 x 15  Standing ER c arm at side c towel green band 2 x 10 bilateral, focus on slow movement pattern  Estim  IFC to Lt shoulder to tolerance 12 mins in sitting, no adverse reaction  Vasopnuematic device X 10 min, medium compression, 34 deg to Lt shoulder combined with E stim listed above   PATIENT EDUCATION: Education details: HEP, PT plan of care Person educated: Patient Education method: Explanation, Demonstration, Verbal cues, and Handouts Education comprehension: verbalized understanding and needs further education   HOME EXERCISE  PROGRAM: Access Code: [email or web address] URL: [email or web address] Date: 01/02/2022 Prepared by: Elsie Ra  Exercises - Supine Chin Tuck  - 2 x daily - 6 x weekly - 1-2 sets - 10 reps - Seated Gentle Upper Trapezius Stretch (Mirrored)  - 2 x daily - 6 x weekly - 1 sets - 3 reps - 20 sec hold - Seated Levator Scapulae Stretch  - 2 x daily - 6 x weekly - 1 sets - 3 reps - 20 sec hold - Standing Single Arm Shoulder Flexion Stretch on Wall (Mirrored)  - 2 x daily - 6 x weekly - 1 sets - 10 reps - Standing Shoulder Internal Rotation Stretch with Towel  - 2 x daily - 6 x weekly - 1 sets - 10 reps - 5 sec hold - Shoulder External Rotation and Scapular Retraction with Resistance  - 2 x daily - 6 x weekly - 1-2 sets - 10 reps  ASSESSMENT:  CLINICAL IMPRESSION:  I progressed his strength program adding upper body weight machines today and he had good overall tolerance to this without increased pain reported. PT will continue to work to improve ROM and strength of bilat shoulders as tolerated.    OBJECTIVE IMPAIRMENTS: decreased activity  tolerance, decreased shoulder mobility, decreased ROM, decreased strength, impaired flexibility, impaired UE use, postural dysfunction, and pain.  ACTIVITY LIMITATIONS: reaching, lifting, carry,  cleaning, driving, and or occupation  PERSONAL FACTORS: previous Rt shoulder surgery also affecting patient's functional outcome.  REHAB POTENTIAL: Good  CLINICAL DECISION MAKING: Stable/uncomplicated  EVALUATION COMPLEXITY: Low    GOALS: Short term PT Goals Target date: 01/30/2022 Pt will be I and compliant with HEP. Baseline:  Goal status: MET 01/31/22 Pt will decrease pain by 25% overall Baseline: Goal status: MET 01/31/22  Long term PT goals Target date: 03/13/2022 Pt will improve Rt shoulder AROM to [facility] to improve functional reaching Baseline: Goal status: ongoing Pt will improve  Rt shoulder strength to at least 4+/5 MMT to improve functional  strength Baseline: Goal status: ongoing Pt will improve FOTO to at least 66% functional to show improved function Baseline: Goal status: ongoing Pt will reduce pain to overall less than 3/10 with usual activity and work activity. Baseline: Goal status: ongoing  PLAN: PT FREQUENCY: 1-2 times per week   PT DURATION: 8-10 weeks  PLANNED INTERVENTIONS (unless contraindicated): aquatic PT, Canalith repositioning, cryotherapy, Electrical stimulation, Iontophoresis with 4 mg/ml dexamethasome, Moist heat, traction, Ultrasound, gait training, Therapeutic exercise, balance training, neuromuscular re-education, patient/family education, prosthetic training, manual techniques, passive ROM, dry needling, taping, vasopnuematic device, vestibular, spinal manipulations, joint manipulations  PLAN FOR NEXT SESSION:  Progressive strengthening for shoulders.  Estim and or vaso as desired.   Elsie Ra, PT, DPT 02/06/22 11:12 AM

## 2022-02-12 ENCOUNTER — Encounter: Payer: Self-pay | Admitting: Physical Therapy

## 2022-02-12 ENCOUNTER — Ambulatory Visit (INDEPENDENT_AMBULATORY_CARE_PROVIDER_SITE_OTHER): Payer: No Typology Code available for payment source | Admitting: Physical Therapy

## 2022-02-12 DIAGNOSIS — M542 Cervicalgia: Secondary | ICD-10-CM

## 2022-02-12 DIAGNOSIS — R600 Localized edema: Secondary | ICD-10-CM | POA: Diagnosis not present

## 2022-02-12 DIAGNOSIS — M25512 Pain in left shoulder: Secondary | ICD-10-CM | POA: Diagnosis not present

## 2022-02-12 DIAGNOSIS — M6281 Muscle weakness (generalized): Secondary | ICD-10-CM

## 2022-02-12 DIAGNOSIS — G8929 Other chronic pain: Secondary | ICD-10-CM

## 2022-02-12 NOTE — Therapy (Signed)
OUTPATIENT PHYSICAL THERAPY TREATMENT   Patient Name: Tony Nguyen MRN: [id number] DOB:[birth year], 61 y.o., male Today's Date: 02/12/2022  END OF SESSION:  PT End of Session - 02/12/22 1137     Visit Number 7    Number of Visits 10    Date for PT Re-Evaluation 02/27/22    Authorization - Number of Visits 10   per MD referral   PT Start Time 1133    PT Stop Time 1213    PT Time Calculation (min) 40 min    Activity Tolerance Patient tolerated treatment well    Behavior During Therapy [facility] for tasks assessed/performed              Past Medical History:  Diagnosis Date   Bug bite 08/09/2018   Duodenal ulcer due to Helicobacter pylori 63/78/5885   EGD confirmed that duodenal ulcers and H. pylori gastritis in 08/2009. Treated with triple  therapy at [place].    Gallbladder polyp 09/25/2010   GERD (gastroesophageal reflux disease)    Major depression in remission ([place])    OTHER DISEASES OF NASAL CAVITY AND SINUSES 02/23/2008   Qualifier: Diagnosis of  By: Hassell Done MD, [place], CHRONIC 04/16/2006   Qualifier: Diagnosis of  By: Herma Ard     Screening for prostate cancer 08/12/2019   TENNIS ELBOW 04/16/2006   Qualifier: Diagnosis of  By: Herma Ard     Tinea corporis 06/01/2017   Past Surgical History:  Procedure Laterality Date   APPENDECTOMY  1981   CHOLECYSTECTOMY  10/15/10   Path results revealed chronic cholecystitis   ELBOW SURGERY  2005   right   SHOULDER ARTHROSCOPY WITH SUBACROMIAL DECOMPRESSION, ROTATOR CUFF REPAIR AND BICEP TENDON REPAIR Right 02/05/2021   Procedure: RIGHT SHOULDER ARTHROSCOPY, SUBACROMIAL DECOMPRESSION, DEBRIDEMENT, MINI OPEN ROTATOR CUFF TEAR REPAIR, BICEPS TENODESIS;  Surgeon: Meredith Pel, MD;  Location: [place];  Service: Orthopedics;  Laterality: Right;   UPPER GASTROINTESTINAL ENDOSCOPY     Patient Active Problem List   Diagnosis Date Noted   Unilateral conductive hearing loss 02/26/2021    Pre-syncope 02/26/2021   Complete tear of right rotator cuff    Biceps tendonitis on right    Degenerative superior labral anterior-to-posterior (SLAP) tear of right shoulder    Acute pain of right shoulder 01/11/2020   Blood pressure check 10/20/2019   Mood changes 04/12/2019   Acute midline low back pain without sciatica 02/13/2019   Erectile dysfunction 07/23/2018   Ganglion cyst of dorsum of left wrist 04/20/2018   Prediabetes 04/17/2016   OBESITY, NOS 04/16/2006    PCP: Rise Patience, DO   REFERRING PROVIDER: Lorine Bears, NP  REFERRING DIAG: [phone] (ICD-10-CM) - Radiculopathy, cervical region M25.512,G89.29 (ICD-10-CM) - Chronic left shoulder pain M79.18 (ICD-10-CM) - Myofascial pain syndrome M48.02 (ICD-10-CM) - Foraminal stenosis of cervical region  THERAPY DIAG:  Chronic left shoulder pain  Cervicalgia  Localized edema  Muscle weakness (generalized)  Rationale for Evaluation and Treatment: Rehabilitation  ONSET DATE: Pain since July 2022  SUBJECTIVE:  SUBJECTIVE STATEMENT: He relays the pain is doing good only has a problem reaching behind his back. He did not have any soreness or pain from strength progressions last time.   PERTINENT HISTORY:  s/p Rt shoulder SAD, mini open rotator cuff repair, biceps tenodesis - 02/05/2021  PAIN:  NPRS scale: 2/10 Pain location: left neck and shoulder Pain description: burning pain Aggravating factors: reaching back, lifting arm, pressure on arm Relieving factors: pain ointment, sometimes ice, has not tried heat  PRECAUTIONS: None  WEIGHT BEARING RESTRICTIONS: No  FALLS:  Has patient fallen in last 6 months? No  OCCUPATION: currently out of work, tried to go back but unable to perform, he is a Production designer, theatre/television/film  with frequent lifting.   PLOF: Independent  PATIENT GOALS: "to get better"  NEXT MD VISIT:   OBJECTIVE:   DIAGNOSTIC FINDINGS:  01/02/2022 Recent cervical MRI imaging exhibits multi level degenerative changes and foraminal narrowing, moderate to severe on the right at C5-C6 and C6-C7. No high grade spinal canal stenosis. Bilateral upper extremity NCV with EMG performed exhibits mild median nerve entrapment on the left.   PATIENT SURVEYS: 01/02/2022  Eval: FOTO 55% functional  COGNITION: 01/02/2022 Overall cognitive status: Within functional limits for tasks assessed  SENSATION: 01/02/2022 [facility]  POSTURE:  01/02/2022 rounded shoulders and forward head  PALPATION: 01/02/2022 Tender to palpation in lateral left shoulder and upper trap   CERVICAL ROM:   Active ROM AROM (deg) 01/02/2022 AROM 01/31/22  Flexion 30 40  Extension 30 40  Right lateral flexion 20 30  Left lateral flexion 10 30  Right rotation 50 60  Left rotation 45 60   (Blank rows = not tested)  UPPER EXTREMITY ROM:  Active ROM Right 01/02/2022 Left 01/02/2022 Left 01/31/22  Shoulder flexion  120 130  Shoulder extension     Shoulder abduction  130 140  Shoulder adduction     Shoulder extension     Shoulder internal rotation  Reaching behind back to iliac crest Reaching behind back to Lt SIJ  Shoulder external rotation  [facility]  Elbow flexion     Elbow extension     Wrist flexion     Wrist extension     Wrist ulnar deviation     Wrist radial deviation     Wrist pronation     Wrist supination      (Blank rows = not tested)  UPPER EXTREMITY MMT:  MMT Right 01/02/2022 Left 01/02/2022 Right 01/15/2022 Left 01/15/2022  Shoulder flexion 4+ 4 5/5 4/5 c pain  Shoulder extension      Shoulder abduction 4+ 4 5/5 4/5 c pain  Shoulder adduction      Shoulder extension      Shoulder internal rotation 4+ 4+ 5/5 5/5  Shoulder external rotation 5 4+ 5/5 5/5  Middle trapezius      Lower  trapezius      Elbow flexion      Elbow extension      Wrist flexion      Wrist extension      Wrist ulnar deviation      Wrist radial deviation      Wrist pronation      Wrist supination      Grip strength       (Blank rows = not tested)  CERVICAL SPECIAL TESTS:  01/02/2022 Spurling's test: Negative inconclusive shoulder impingement signs  FUNCTIONAL TESTS:   TODAY'S TREATMENT:  DATE:  02/12/2022 Therex:  UBE L3 3 min fwd, 3 min retro  Standing IR stretch behind back on  5 sec X 10 bilat  Standing Horizontal abd blue 2X10  Standing blue band rows 2 x 15 c scap retraction  Standing blue band GH ext past legs 3 sec hold 2 x 15  Standing ER  bilat with blue 2X10  Standing IR with blue 2X10 bilat  Chest press machine 35# 2X15  Row machine 45# 2X15  Lat pulldown machine 45# 2X15   Estim  IFC to Lt shoulder to tolerance 15 mins in sitting, no adverse reaction  Vasopnuematic device X 10 min, medium compression, 34 deg to Lt shoulder combined with E stim listed above    PATIENT EDUCATION: Education details: HEP, PT plan of care Person educated: Patient Education method: Explanation, Demonstration, Verbal cues, and Handouts Education comprehension: verbalized understanding and needs further education   HOME EXERCISE PROGRAM: Access Code: [email or web address] URL: [email or web address] Date: 01/02/2022 Prepared by: Elsie Ra  Exercises - Supine Chin Tuck  - 2 x daily - 6 x weekly - 1-2 sets - 10 reps - Seated Gentle Upper Trapezius Stretch (Mirrored)  - 2 x daily - 6 x weekly - 1 sets - 3 reps - 20 sec hold - Seated Levator Scapulae Stretch  - 2 x daily - 6 x weekly - 1 sets - 3 reps - 20 sec hold - Standing Single Arm Shoulder Flexion Stretch on Wall (Mirrored)  - 2 x daily - 6 x weekly - 1 sets - 10 reps - Standing Shoulder Internal  Rotation Stretch with Towel  - 2 x daily - 6 x weekly - 1 sets - 10 reps - 5 sec hold - Shoulder External Rotation and Scapular Retraction with Resistance  - 2 x daily - 6 x weekly - 1-2 sets - 10 reps  ASSESSMENT:  CLINICAL IMPRESSION:  He has made good progress with PT and feels he will likely be ready to discharge next visit. We will update measurements and plan for discharge unless there are any setbacks.    OBJECTIVE IMPAIRMENTS: decreased activity tolerance, decreased shoulder mobility, decreased ROM, decreased strength, impaired flexibility, impaired UE use, postural dysfunction, and pain.  ACTIVITY LIMITATIONS: reaching, lifting, carry,  cleaning, driving, and or occupation  PERSONAL FACTORS: previous Rt shoulder surgery also affecting patient's functional outcome.  REHAB POTENTIAL: Good  CLINICAL DECISION MAKING: Stable/uncomplicated  EVALUATION COMPLEXITY: Low    GOALS: Short term PT Goals Target date: 01/30/2022 Pt will be I and compliant with HEP. Baseline:  Goal status: MET 01/31/22 Pt will decrease pain by 25% overall Baseline: Goal status: MET 01/31/22  Long term PT goals Target date: 03/13/2022 Pt will improve Rt shoulder AROM to [facility] to improve functional reaching Baseline: Goal status: ongoing Pt will improve  Rt shoulder strength to at least 4+/5 MMT to improve functional strength Baseline: Goal status: ongoing Pt will improve FOTO to at least 66% functional to show improved function Baseline: Goal status: ongoing Pt will reduce pain to overall less than 3/10 with usual activity and work activity. Baseline: Goal status: ongoing  PLAN: PT FREQUENCY: 1-2 times per week   PT DURATION: 8-10 weeks  PLANNED INTERVENTIONS (unless contraindicated): aquatic PT, Canalith repositioning, cryotherapy, Electrical stimulation, Iontophoresis with 4 mg/ml dexamethasome, Moist heat, traction, Ultrasound, gait training, Therapeutic exercise, balance training,  neuromuscular re-education, patient/family education, prosthetic training, manual techniques, passive ROM, dry needling, taping, vasopnuematic device, vestibular, spinal manipulations, joint manipulations  PLAN FOR  NEXT SESSION:  DC if he feels ready Elsie Ra, PT, DPT 02/12/22 11:37 AM

## 2022-02-14 ENCOUNTER — Ambulatory Visit
Admission: RE | Admit: 2022-02-14 | Discharge: 2022-02-14 | Disposition: A | Discharge status: Home / Self Care | Payer: No Typology Code available for payment source | Source: Ambulatory Visit | Attending: Orthopedic Surgery | Admitting: Orthopedic Surgery

## 2022-02-14 ENCOUNTER — Other Ambulatory Visit: Payer: Self-pay | Admitting: Orthopedic Surgery

## 2022-02-14 ENCOUNTER — Encounter: Payer: Self-pay | Admitting: Physical Therapy

## 2022-02-14 ENCOUNTER — Ambulatory Visit (INDEPENDENT_AMBULATORY_CARE_PROVIDER_SITE_OTHER): Payer: No Typology Code available for payment source | Admitting: Physical Therapy

## 2022-02-14 DIAGNOSIS — M25512 Pain in left shoulder: Secondary | ICD-10-CM | POA: Diagnosis not present

## 2022-02-14 DIAGNOSIS — G8929 Other chronic pain: Secondary | ICD-10-CM

## 2022-02-14 DIAGNOSIS — M542 Cervicalgia: Secondary | ICD-10-CM | POA: Diagnosis not present

## 2022-02-14 DIAGNOSIS — M6281 Muscle weakness (generalized): Secondary | ICD-10-CM | POA: Diagnosis not present

## 2022-02-14 DIAGNOSIS — R600 Localized edema: Secondary | ICD-10-CM | POA: Diagnosis not present

## 2022-02-14 MED ORDER — IOPAMIDOL (ISOVUE-M 200) INJECTION 41%
15.0000 mL | Freq: Once | INTRAMUSCULAR | Status: AC
  Administered 2022-02-14: 15 mL via INTRA_ARTICULAR

## 2022-02-14 NOTE — Therapy (Signed)
OUTPATIENT PHYSICAL THERAPY TREATMENT/Discharge PHYSICAL THERAPY DISCHARGE SUMMARY  Visits from Start of Care: 8  Current functional level related to goals / functional outcomes: See below   Remaining deficits: See below   Education / Equipment: HEP  Plan:  Patient goals were met. Patient is being discharged due to meeting the PT goals      Patient Name: Tony Nguyen MRN: [id number] DOB:Feb 17, 1961, 61 y.o., male Today's Date: 02/14/2022  END OF SESSION:  PT End of Session - 02/14/22 1109     Visit Number 8    Number of Visits 10    Date for PT Re-Evaluation 02/27/22    Authorization - Number of Visits 10   per MD referral   PT Start Time 1055    PT Stop Time 1125    PT Time Calculation (min) 30 min    Activity Tolerance Patient tolerated treatment well    Behavior During Therapy [facility] for tasks assessed/performed              Past Medical History:  Diagnosis Date   Bug bite 08/09/2018   Duodenal ulcer due to Helicobacter pylori 54/65/6812   EGD confirmed that duodenal ulcers and H. pylori gastritis in 08/2009. Treated with triple  therapy at [place].    Gallbladder polyp 09/25/2010   GERD (gastroesophageal reflux disease)    Major depression in remission ([place])    OTHER DISEASES OF NASAL CAVITY AND SINUSES 02/23/2008   Qualifier: Diagnosis of  By: Hassell Done MD, [place], CHRONIC 04/16/2006   Qualifier: Diagnosis of  By: Herma Ard     Screening for prostate cancer 08/12/2019   TENNIS ELBOW 04/16/2006   Qualifier: Diagnosis of  By: Herma Ard     Tinea corporis 06/01/2017   Past Surgical History:  Procedure Laterality Date   APPENDECTOMY  1981   CHOLECYSTECTOMY  10/15/10   Path results revealed chronic cholecystitis   ELBOW SURGERY  2005   right   SHOULDER ARTHROSCOPY WITH SUBACROMIAL DECOMPRESSION, ROTATOR CUFF REPAIR AND BICEP TENDON REPAIR Right 02/05/2021   Procedure: RIGHT SHOULDER ARTHROSCOPY, SUBACROMIAL  DECOMPRESSION, DEBRIDEMENT, MINI OPEN ROTATOR CUFF TEAR REPAIR, BICEPS TENODESIS;  Surgeon: Meredith Pel, MD;  Location: [place];  Service: Orthopedics;  Laterality: Right;   UPPER GASTROINTESTINAL ENDOSCOPY     Patient Active Problem List   Diagnosis Date Noted   Unilateral conductive hearing loss 02/26/2021   Pre-syncope 02/26/2021   Complete tear of right rotator cuff    Biceps tendonitis on right    Degenerative superior labral anterior-to-posterior (SLAP) tear of right shoulder    Acute pain of right shoulder 01/11/2020   Blood pressure check 10/20/2019   Mood changes 04/12/2019   Acute midline low back pain without sciatica 02/13/2019   Erectile dysfunction 07/23/2018   Ganglion cyst of dorsum of left wrist 04/20/2018   Prediabetes 04/17/2016   OBESITY, NOS 04/16/2006    PCP: Rise Patience, DO   REFERRING PROVIDER: Lorine Bears, NP  REFERRING DIAG: [phone] (ICD-10-CM) - Radiculopathy, cervical region M25.512,G89.29 (ICD-10-CM) - Chronic left shoulder pain M79.18 (ICD-10-CM) - Myofascial pain syndrome M48.02 (ICD-10-CM) - Foraminal stenosis of cervical region  THERAPY DIAG:  Chronic left shoulder pain  Cervicalgia  Muscle weakness (generalized)  Localized edema  Rationale for Evaluation and Treatment: Rehabilitation  ONSET DATE: Pain since July 2022  SUBJECTIVE:  SUBJECTIVE STATEMENT: He relays the pain is good overall and feels ready to discharge today, he requests to have the vasopneumatic device and Estim one more time as this helps the most with the pain.  PERTINENT HISTORY:  s/p Rt shoulder SAD, mini open rotator cuff repair, biceps tenodesis - 02/05/2021  PAIN:  NPRS scale: 2/10 Pain location: left neck and shoulder Pain description: burning  pain Aggravating factors: reaching back, lifting arm, pressure on arm Relieving factors: pain ointment, sometimes ice, has not tried heat  PRECAUTIONS: None  WEIGHT BEARING RESTRICTIONS: No  FALLS:  Has patient fallen in last 6 months? No  OCCUPATION: currently out of work, tried to go back but unable to perform, he is a Production designer, theatre/television/film with frequent lifting.   PLOF: Independent  PATIENT GOALS: "to get better"  NEXT MD VISIT:   OBJECTIVE:   DIAGNOSTIC FINDINGS:  01/02/2022 Recent cervical MRI imaging exhibits multi level degenerative changes and foraminal narrowing, moderate to severe on the right at C5-C6 and C6-C7. No high grade spinal canal stenosis. Bilateral upper extremity NCV with EMG performed exhibits mild median nerve entrapment on the left.   PATIENT SURVEYS: 01/02/2022  Eval: FOTO 55% functional 02/14/22 FOTO improved to 66% and met goal for this  COGNITION: 01/02/2022 Overall cognitive status: Within functional limits for tasks assessed  SENSATION: 01/02/2022 [facility]  POSTURE:  01/02/2022 rounded shoulders and forward head  PALPATION: 01/02/2022 Tender to palpation in lateral left shoulder and upper trap   CERVICAL ROM:   Active ROM AROM (deg) 01/02/2022 AROM 01/31/22 AROM 02/14/22  Flexion 30 40 WFL  Extension 30 40 WFL  Right lateral flexion 20 30 WFL  Left lateral flexion 10 30 WFL  Right rotation 50 60 WFL  Left rotation 45 60 WFl   (Blank rows = not tested)  UPPER EXTREMITY ROM:  Active ROM Right 01/02/2022 Left 01/02/2022 Left 01/31/22 Left 02/14/22  Shoulder flexion  120 130 WFL  Shoulder extension      Shoulder abduction  130 140 WFL  Shoulder adduction      Shoulder extension      Shoulder internal rotation  Reaching behind back to iliac crest Reaching behind back to Lt [facility]  Shoulder external rotation  [facility]  Elbow flexion      Elbow extension      Wrist flexion      Wrist extension      Wrist ulnar deviation       Wrist radial deviation      Wrist pronation      Wrist supination       (Blank rows = not tested)  UPPER EXTREMITY MMT:  MMT Right 01/02/2022 Left 01/02/2022 Right 01/15/2022 Left 01/15/2022 Left 02/14/22  Shoulder flexion 4+ 4 5/5 4/5 c pain 5  Shoulder extension       Shoulder abduction 4+ 4 5/5 4/5 c pain 5  Shoulder adduction       Shoulder extension       Shoulder internal rotation 4+ 4+ 5/5 5/5 5  Shoulder external rotation 5 4+ 5/5 5/5 5  Middle trapezius       Lower trapezius       Elbow flexion       Elbow extension       Wrist flexion       Wrist extension       Wrist ulnar deviation       Wrist radial deviation       Wrist pronation  Wrist supination       Grip strength        (Blank rows = not tested)  CERVICAL SPECIAL TESTS:  01/02/2022 Spurling's test: Negative inconclusive shoulder impingement signs  FUNCTIONAL TESTS:   TODAY'S TREATMENT:                                                                                                                             DATE:  02/14/2022 Therex: HEP updated to include his progressions and guidelines, this was reviewed with him for understanding  Estim  IFC to Lt shoulder to tolerance 15 mins in sitting, no adverse reaction  Vasopnuematic device X 15 min, medium compression, 34 deg to Lt shoulder combined with E stim listed above    PATIENT EDUCATION: Education details: HEP, PT plan of care Person educated: Patient Education method: Explanation, Demonstration, Verbal cues, and Handouts Education comprehension: verbalized understanding and needs further education   HOME EXERCISE PROGRAM: Access Code: [email or web address] URL: [email or web address] Date: 02/14/2022 Prepared by: Elsie Ra  Exercises - Standing Shoulder Internal Rotation Stretch with Towel  - 1 x daily - 3-4 x weekly - 1 sets - 10 reps - 5 sec hold - Shoulder External Rotation and Scapular Retraction with Resistance  - 1  x daily - 3-4 x weekly - 1-2 sets - 10 reps - Shoulder extension with resistance - Neutral  - 1 x daily - 3-4 x weekly - 2-3 sets - 10 reps - Standing Shoulder Horizontal Abduction with Resistance  - 1 x daily - 3-4 x weekly - 3 sets - 10 reps - Standing Shoulder Row with Anchored Resistance  - 1 x daily - 3-4 x weekly - 3 sets - 10 reps - Supine Chin Tuck  - 1 x daily - 3-4 x weekly - 1 sets - 10 reps - Seated Gentle Upper Trapezius Stretch (Mirrored)  - 1 x daily - 3-4 x weekly - 1 sets - 3 reps - 20 sec hold - Seated Levator Scapulae Stretch  - 1 x daily - 3-4 x weekly - 1 sets - 3 reps - 20 sec hold ASSESSMENT:  CLINICAL IMPRESSION:  He has made good progress with PT and feels ready to discharge today to independent program. He has met his PT goals. HEP was updated and reviewed with him for strength progressions. He had no further questions or concerns at this time.    OBJECTIVE IMPAIRMENTS: decreased activity tolerance, decreased shoulder mobility, decreased ROM, decreased strength, impaired flexibility, impaired UE use, postural dysfunction, and pain.  ACTIVITY LIMITATIONS: reaching, lifting, carry,  cleaning, driving, and or occupation  PERSONAL FACTORS: previous Rt shoulder surgery also affecting patient's functional outcome.  REHAB POTENTIAL: Good  CLINICAL DECISION MAKING: Stable/uncomplicated  EVALUATION COMPLEXITY: Low    GOALS: Short term PT Goals Target date: 01/30/2022 Pt will be I and compliant with HEP. Baseline:  Goal status: MET 01/31/22 Pt will decrease pain by 25% overall Baseline: Goal status: MET  01/31/22  Long term PT goals Target date: 03/13/2022 Pt will improve Rt shoulder AROM to [facility] to improve functional reaching Baseline: Goal status: MET 02/14/22 Pt will improve  Rt shoulder strength to at least 4+/5 MMT to improve functional strength Baseline: Goal status: MET 02/14/22 Pt will improve FOTO to at least 66% functional to show improved  function Baseline: Goal status: MET 02/14/22 Pt will reduce pain to overall less than 3/10 with usual activity and work activity. Baseline: Goal status: MET 02/14/22  PLAN: PT FREQUENCY: 1-2 times per week   PT DURATION: 8-10 weeks  PLANNED INTERVENTIONS (unless contraindicated): aquatic PT, Canalith repositioning, cryotherapy, Electrical stimulation, Iontophoresis with 4 mg/ml dexamethasome, Moist heat, traction, Ultrasound, gait training, Therapeutic exercise, balance training, neuromuscular re-education, patient/family education, prosthetic training, manual techniques, passive ROM, dry needling, taping, vasopnuematic device, vestibular, spinal manipulations, joint manipulations  PLAN FOR NEXT SESSION:  DC today Elsie Ra, PT, DPT 02/14/22 11:10 AM

## 2022-02-19 ENCOUNTER — Ambulatory Visit (INDEPENDENT_AMBULATORY_CARE_PROVIDER_SITE_OTHER): Payer: No Typology Code available for payment source | Admitting: Surgical

## 2022-02-19 DIAGNOSIS — M25512 Pain in left shoulder: Secondary | ICD-10-CM

## 2022-02-19 DIAGNOSIS — G8929 Other chronic pain: Secondary | ICD-10-CM

## 2022-02-20 ENCOUNTER — Encounter: Payer: Self-pay | Admitting: Orthopedic Surgery

## 2022-02-20 NOTE — Progress Notes (Signed)
Patient returns to discuss MRI of the shoulder.  Has not actually had the MRI yet due to failed arthrogram.  Has repeat MRI scheduled for 03/06/2022.  Plan to stay out of work until 03/11/2022.  He will follow-up on the 22nd for review of left shoulder MRI.  Did report that his right shoulder is feeling a lot better and it is really his left shoulder that is causing him significant difficulty.  Had good relief from prior [facility] joint injection.  Follow-up after MRI.

## 2022-02-21 ENCOUNTER — Other Ambulatory Visit: Payer: No Typology Code available for payment source

## 2022-02-25 ENCOUNTER — Telehealth: Payer: Self-pay | Admitting: Orthopedic Surgery

## 2022-02-25 NOTE — Telephone Encounter (Signed)
Care nurse(Kim) case manager with [employer], needs to speak to a tech about patient--[phone]

## 2022-02-25 NOTE — Telephone Encounter (Signed)
Tried calling back, no answer. LMVM advising was returning call to discuss. Advised on VM if I am unavailable when they return call should leave as many details with whomever answers so can better help

## 2022-02-26 ENCOUNTER — Telehealth: Payer: Self-pay | Admitting: Orthopedic Surgery

## 2022-02-26 NOTE — Telephone Encounter (Signed)
Holding for Lauren. ?

## 2022-02-26 NOTE — Telephone Encounter (Signed)
Nurse case manager returning call from [employer]..[phone], ext [phone], she also faxed over a letter that has all the questions that need to be answered.

## 2022-02-27 NOTE — Telephone Encounter (Signed)
Letter not received. Holding for review.

## 2022-03-03 NOTE — Telephone Encounter (Signed)
Still not received.

## 2022-03-03 NOTE — Telephone Encounter (Signed)
I called and the office is closed today. LM ON VM for Kim to advise that paperwork has still not been received and could they resend

## 2022-03-06 ENCOUNTER — Ambulatory Visit
Admission: RE | Admit: 2022-03-06 | Discharge: 2022-03-06 | Disposition: A | Discharge status: Home / Self Care | Payer: No Typology Code available for payment source | Source: Ambulatory Visit | Attending: Orthopedic Surgery | Admitting: Orthopedic Surgery

## 2022-03-06 ENCOUNTER — Other Ambulatory Visit: Payer: No Typology Code available for payment source

## 2022-03-06 DIAGNOSIS — G8929 Other chronic pain: Secondary | ICD-10-CM

## 2022-03-06 MED ORDER — IOPAMIDOL (ISOVUE-M 200) INJECTION 41%
15.0000 mL | Freq: Once | INTRAMUSCULAR | Status: AC
  Administered 2022-03-06: 15 mL via INTRA_ARTICULAR

## 2022-03-10 ENCOUNTER — Encounter: Payer: Self-pay | Admitting: Orthopedic Surgery

## 2022-03-10 ENCOUNTER — Ambulatory Visit: Payer: No Typology Code available for payment source | Admitting: Orthopedic Surgery

## 2022-03-10 DIAGNOSIS — M5412 Radiculopathy, cervical region: Secondary | ICD-10-CM

## 2022-03-10 NOTE — Progress Notes (Signed)
Office Visit Note   Patient: HASHIR DELEEUW           Date of Birth: August 21, 1960           MRN: [id number] Visit Date: 03/10/2022 Requested by: Rise Patience, DO [place],  [place] [address] PCP: Rise Patience, DO  Subjective: Chief Complaint  Patient presents with   Other    Scan review    HPI: Abdul-Rashid I Bocock is a 62 y.o. male who presents to the office reporting left shoulder pain.  Since he was last seen he had an MRI scan of the left shoulder.  This shows some significant supraspinatus tendinosis but no full-thickness tear.  Mild bursitis and mild changes in the [facility] joint.  He reports pain in his neck as well.  Hurts for him to turn his neck and rotation from side-to-side.  He really has a hard time sleeping on either shoulder.  Has had cervical spine MRI which does show herniated disc at the C5-6 level which I think is contributing to most if not all of his symptoms..                ROS: All systems reviewed are negative as they relate to the chief complaint within the history of present illness.  Patient denies fevers or chills.  Assessment & Plan: Visit Diagnoses:  1. Radiculopathy, cervical region     Plan: Impression is left shoulder pain with no definitive operative pathology like he had on the right-hand side.  He does have cervical spine pathology which I think could be helpful with Dr. Ernestina Patches.  Would like for him to see Dr. Ernestina Patches for cervical spine ESI and we will keep him out of work for 2 weeks and then he is okay for regular duty thereafter.  Follow-Up Instructions: No follow-ups on file.   Orders:  Orders Placed This Encounter  Procedures   Ambulatory referral to Physical Medicine Rehab   No orders of the defined types were placed in this encounter.     Procedures: No procedures performed   Clinical Data: No additional findings.  Objective: Vital Signs: There were no vitals taken for this visit.  Physical Exam:   Constitutional: Patient appears well-developed HEENT:  Head: Normocephalic Eyes:EOM are normal Neck: Normal range of motion Cardiovascular: Normal rate Pulmonary/chest: Effort normal Neurologic: Patient is alert Skin: Skin is warm Psychiatric: Patient has normal mood and affect  Ortho Exam: Ortho exam demonstrates reasonable flexion and extension of the cervical spine but he does have some pain with rotation to the right and left around 40 degrees.  He is got excellent rotator cuff strength bilaterally to infraspinatus supraspinatus and subscap muscle testing.  No discrete AC joint tenderness on either side.  Range of motion reasonable bilaterally with 40/95/160 of forward flexion.  No coarseness with internal/external [place] on the left-hand side.  Specialty Comments:  EXAM: MRI CERVICAL SPINE WITHOUT CONTRAST   TECHNIQUE: Multiplanar, multisequence MR imaging of the cervical spine was performed. No intravenous contrast was administered.   COMPARISON:  Cervical spine radiographs 11/22/2021.   FINDINGS: Alignment: Straightening of the expected cervical lordosis. No significant spondylolisthesis.   Vertebrae: Vertebral body height is maintained. Mild degenerative endplate edema at C9-S4. Hemangiomas within the C7 and T1 vertebral bodies.   Cord: No signal abnormality identified within the cervical spinal cord.   Posterior Fossa, vertebral arteries, paraspinal tissues: No abnormality identified within included portions of the posterior fossa. Flow voids preserved within the  imaged cervical vertebral arteries. No paraspinal mass or collection.   Disc levels:   Multilevel disc degeneration, greatest at C5-C6 (mild-to-moderate), C6-C7 (moderate) and C7-T1 (moderate).   C2-C3: No significant disc herniation or stenosis.   C3-C4: Shallow disc bulge. Minimal uncovertebral hypertrophy on the right. No significant spinal canal or foraminal stenosis.   C4-C5: Shallow disc  bulge. Uncovertebral hypertrophy on the right. Mild facet arthrosis. No significant spinal canal stenosis. Mild relative right neural foraminal narrowing.   C5-C6: Disc bulge with bilateral disc osteophyte ridge/uncinate hypertrophy. Mild effacement of the ventral thecal sac (without spinal cord mass effect). Bilateral neural foraminal narrowing (moderate to severe right, moderate left).   C6-C7: Disc bulge with bilateral disc osteophyte ridge/uncinate hypertrophy. Mild effacement of the ventral thecal sac (without spinal cord mass effect). Bilateral neural foraminal narrowing (moderate to severe right, moderate left).   C7-T1: Disc bulge with bilateral disc osteophyte ridge/uncinate hypertrophy. Mild effacement of the ventral thecal sac (without spinal cord mass effect). Severe bilateral neural foraminal narrowing.   IMPRESSION: Cervical spondylosis, as outlined.   No more than mild spinal canal narrowing.   Multilevel foraminal stenosis, greatest bilaterally at C5-C6 (moderate to severe right, moderate left), bilaterally at C6-C7 (moderate to severe right, moderate left) and bilaterally at C7-T1 (severe).   Disc degeneration is greatest at C5-C6 (mild-to-moderate), C6-C7 (moderate) and C7-T1 (moderate). Mild degenerative endplate edema at N2-D7.     Electronically Signed   By: Kellie Simmering D.O.   On: 12/20/2021 09:42  Imaging: No results found.   PMFS History: Patient Active Problem List   Diagnosis Date Noted   Unilateral conductive hearing loss 02/26/2021   Pre-syncope 02/26/2021   Complete tear of right rotator cuff    Biceps tendonitis on right    Degenerative superior labral anterior-to-posterior (SLAP) tear of right shoulder    Acute pain of right shoulder 01/11/2020   Blood pressure check 10/20/2019   Mood changes 04/12/2019   Acute midline low back pain without sciatica 02/13/2019   Erectile dysfunction 07/23/2018   Ganglion cyst of dorsum of left  wrist 04/20/2018   Prediabetes 04/17/2016   OBESITY, NOS 04/16/2006   Past Medical History:  Diagnosis Date   Bug bite 08/09/2018   Duodenal ulcer due to Helicobacter pylori 82/42/3536   EGD confirmed that duodenal ulcers and H. pylori gastritis in 08/2009. Treated with triple  therapy at [place].    Gallbladder polyp 09/25/2010   GERD (gastroesophageal reflux disease)    Major depression in remission [facility])    OTHER DISEASES OF NASAL CAVITY AND SINUSES 02/23/2008   Qualifier: Diagnosis of  By: Hassell Done MD, [place], CHRONIC 04/16/2006   Qualifier: Diagnosis of  By: Herma Ard     Screening for prostate cancer 08/12/2019   TENNIS ELBOW 04/16/2006   Qualifier: Diagnosis of  By: Herma Ard     Tinea corporis 06/01/2017    Family History  Problem Relation Age of Onset   High blood pressure Father    Kidney disease Cousin    Diabetes Neg Hx    Early death Neg Hx    Heart disease Neg Hx    Hyperlipidemia Neg Hx    Hypertension Neg Hx     Past Surgical History:  Procedure Laterality Date   APPENDECTOMY  1981   CHOLECYSTECTOMY  10/15/10   Path results revealed chronic cholecystitis   ELBOW SURGERY  2005   right   SHOULDER ARTHROSCOPY WITH SUBACROMIAL DECOMPRESSION, ROTATOR CUFF  REPAIR AND BICEP TENDON REPAIR Right 02/05/2021   Procedure: RIGHT SHOULDER ARTHROSCOPY, SUBACROMIAL DECOMPRESSION, DEBRIDEMENT, MINI OPEN ROTATOR CUFF TEAR REPAIR, BICEPS TENODESIS;  Surgeon: Cammy Copa, MD;  Location: [facility] OR;  Service: Orthopedics;  Laterality: Right;   UPPER GASTROINTESTINAL ENDOSCOPY     Social History   Occupational History   Occupation: Financial planner: [employer]    Comment: [employer]  Tobacco Use   Smoking status: Never   Smokeless tobacco: Never  Vaping Use   Vaping Use: Never used  Substance and Sexual Activity   Alcohol use: No   Drug use: No   Sexual activity: Yes

## 2022-05-02 ENCOUNTER — Ambulatory Visit: Payer: No Typology Code available for payment source | Admitting: Family Medicine

## 2022-05-23 ENCOUNTER — Other Ambulatory Visit (HOSPITAL_COMMUNITY): Payer: Self-pay

## 2022-06-02 ENCOUNTER — Telehealth: Payer: Self-pay | Admitting: Orthopedic Surgery

## 2022-06-02 NOTE — Telephone Encounter (Signed)
Received call from [facility] w/ [employer]. He is inquiring about patients last visit and work status. I advised that patients last visit was 03/10/22 and patient was given note to be oow x 2 weeks then regular duty,putting him back to work 03/24/22. Maisie Fus ph [phone] ext [phone]

## 2022-06-05 ENCOUNTER — Telehealth: Payer: Self-pay | Admitting: Physical Medicine and Rehabilitation

## 2022-06-05 NOTE — Telephone Encounter (Signed)
Patient wanting to speak to [facility]

## 2022-06-05 NOTE — Telephone Encounter (Signed)
Patient states he needs a note stating he was supposed to get an injection for his long term disability. After talking with him more, he wants a note keeping him out of work. Can Dr. August Saucer take care of that?

## 2022-06-06 NOTE — Telephone Encounter (Signed)
Work note.  Plan on 122 was to see Dr. Alvester Morin for neck injection and he did not do that and also let him go back to work after that.  He has been out of work since then so I cannot really keep him out of work at this time with any type of note that he is requesting.

## 2022-06-09 NOTE — Telephone Encounter (Signed)
Tried calling to advise. No answer. LMVM to [facility]

## 2023-06-01 ENCOUNTER — Ambulatory Visit: Payer: Self-pay | Attending: Obstetrics and Gynecology

## 2023-06-01 DIAGNOSIS — Z31440 Encounter of male for testing for genetic disease carrier status for procreative management: Secondary | ICD-10-CM

## 2023-06-09 IMAGING — CT CT HEAD W/O CM
3 series · 16 of 47 positions shown, 19 images · non-contrast
Comparison: 05/27/2007

CLINICAL DATA: Fall.  Patient struck his head.

EXAM:
CT HEAD WITHOUT CONTRAST
TECHNIQUE: Contiguous axial images were obtained from the base of the skull
through the vertex without intravenous contrast.

[Series 3: head 5.0 h30s · axial · 0.46mm/px · z∈[-79,+66]mm · 10 of 35 slices shown, 13 images]
[im 3/35  brain]
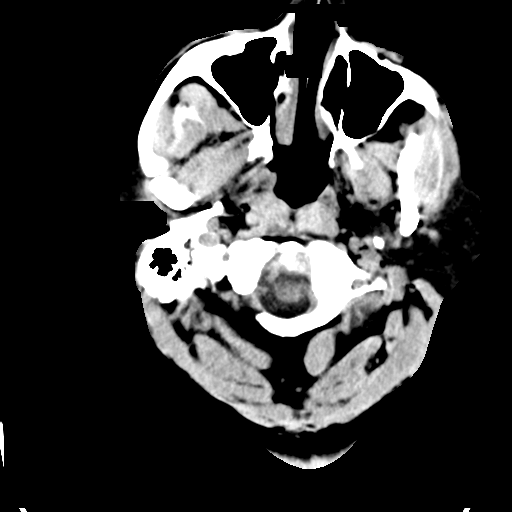
[im 3/35  bone]
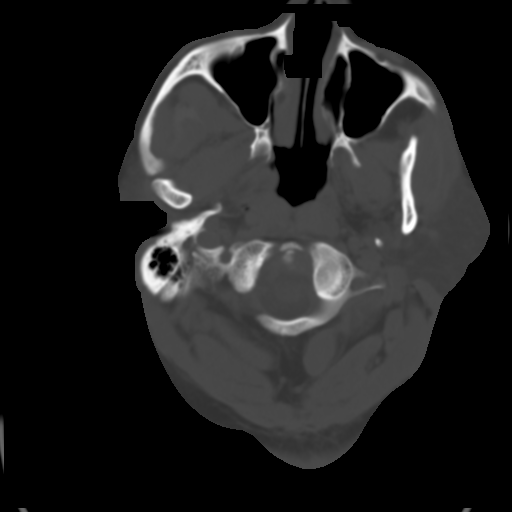
[im 6/35  brain]
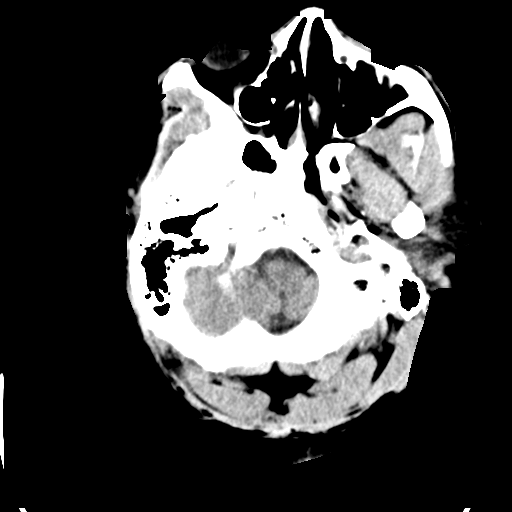
[im 10/35  brain]
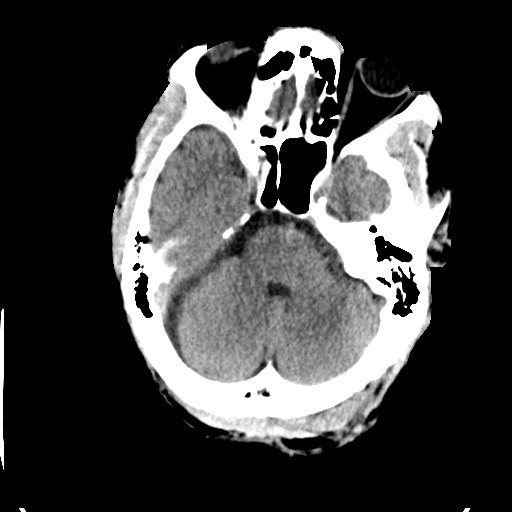
[im 12/35  brain]
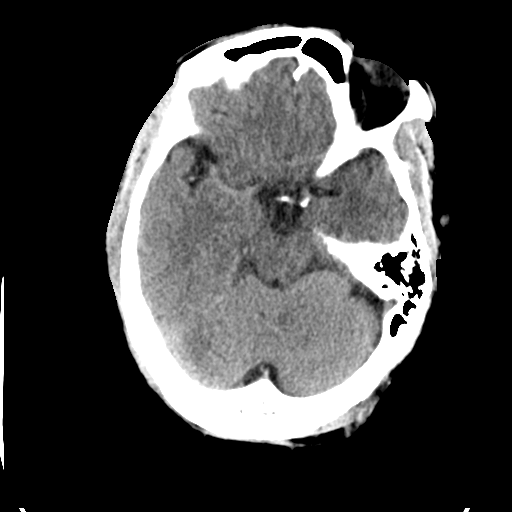
[im 16/35  brain]
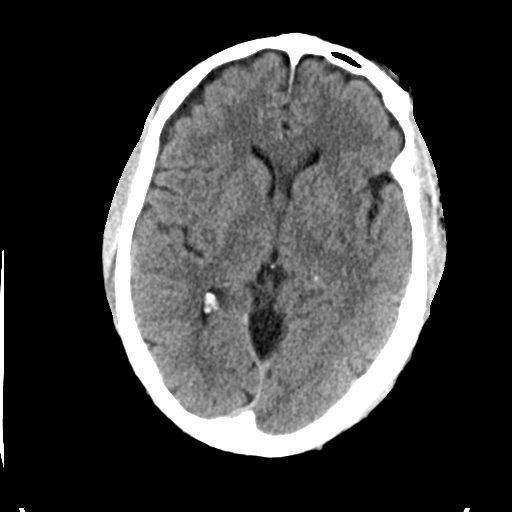
[im 16/35  bone]
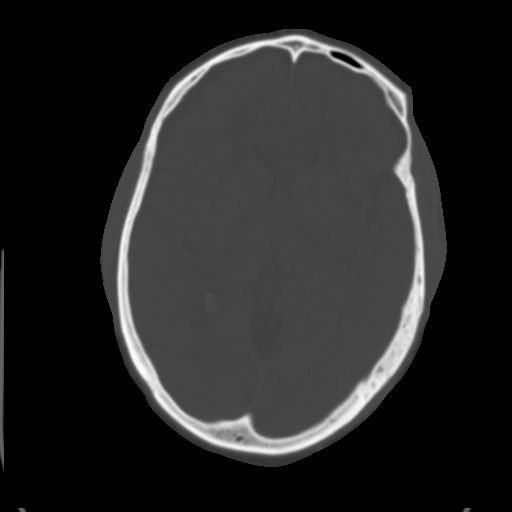
[im 19/35  brain]
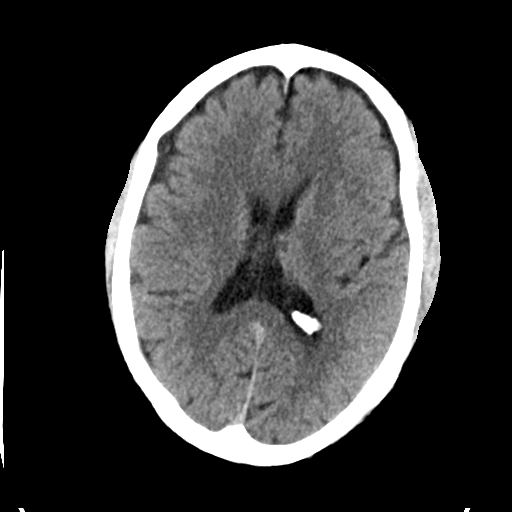
[im 23/35  brain]
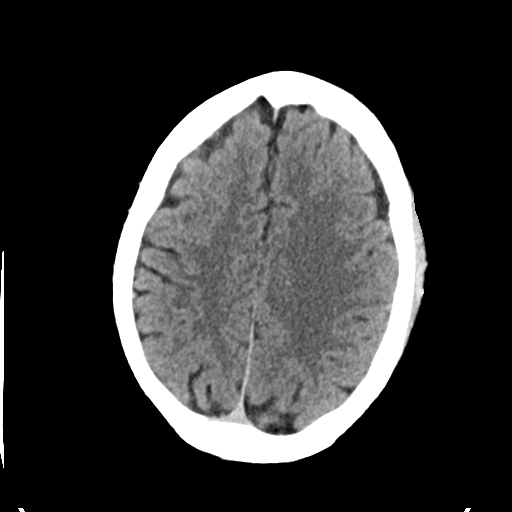
[im 26/35  brain]
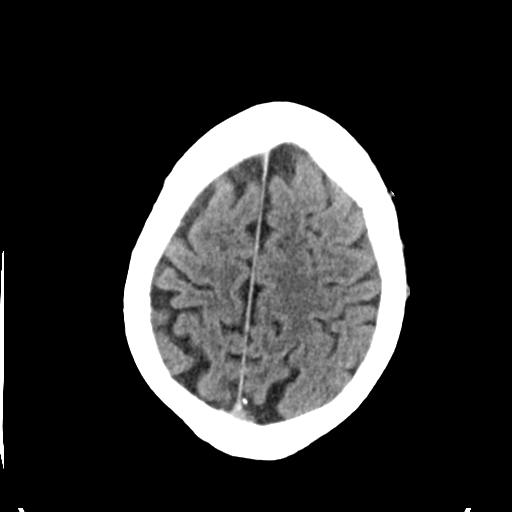
[im 29/35  brain]
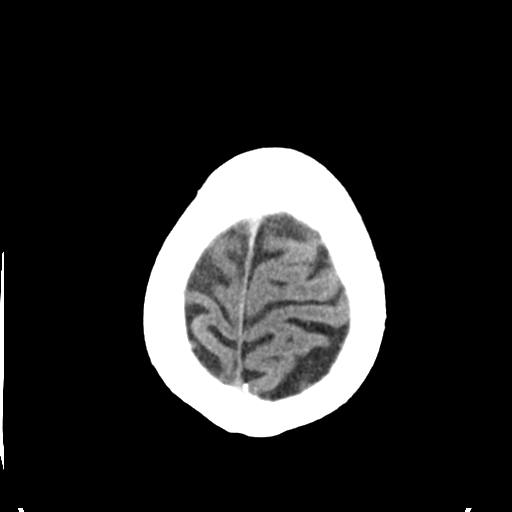
[im 29/35  bone]
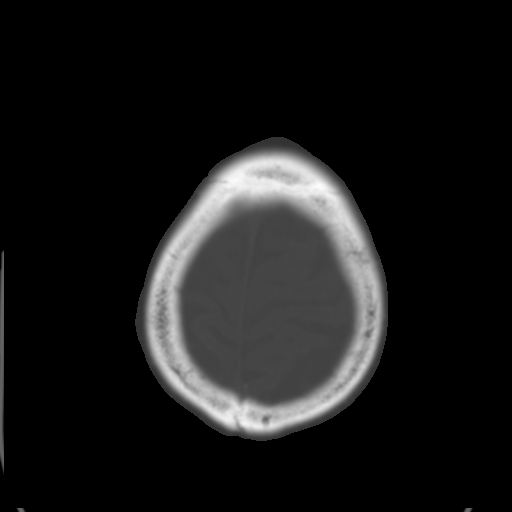
[im 32/35  brain]
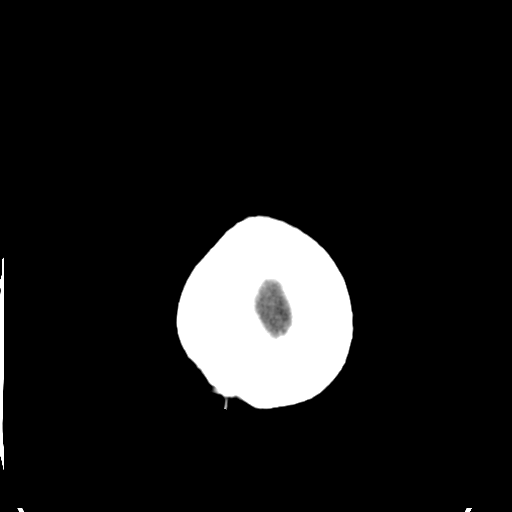

[Series 5: head 3.0 mpr cor · coronal · 0.35mm/px · 3 of 73 slices shown]
[im 25/73  brain]
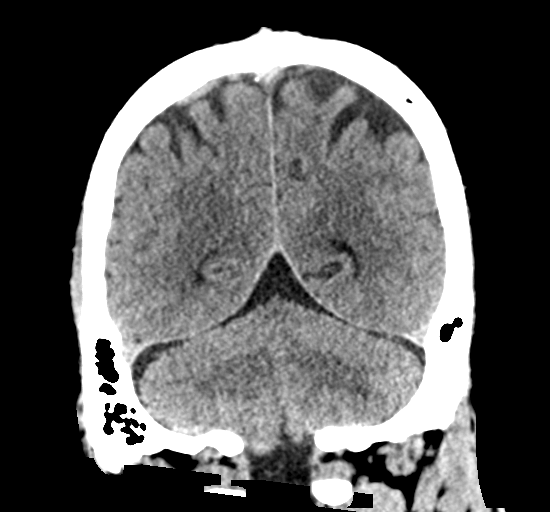
[im 33/73  brain]
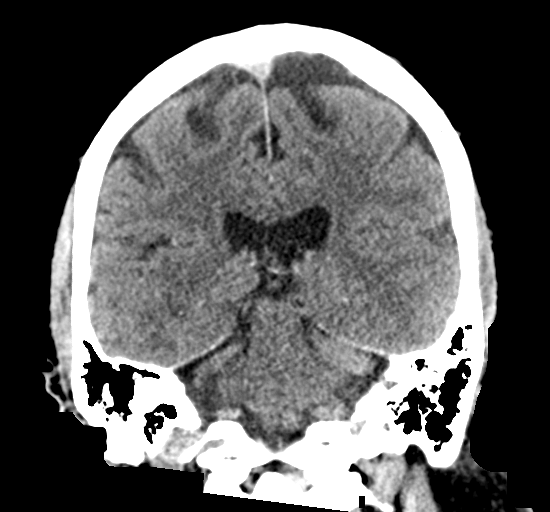
[im 41/73  brain]
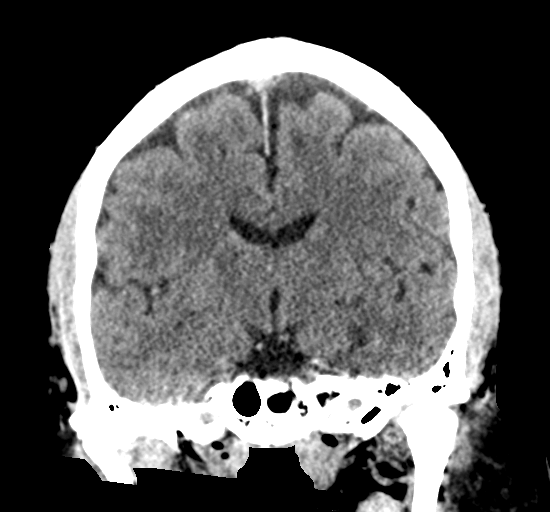

[Series 6: head 3.0 mpr sag · sagittal · 0.35mm/px · 3 of 62 slices shown]
[im 21/62  brain]
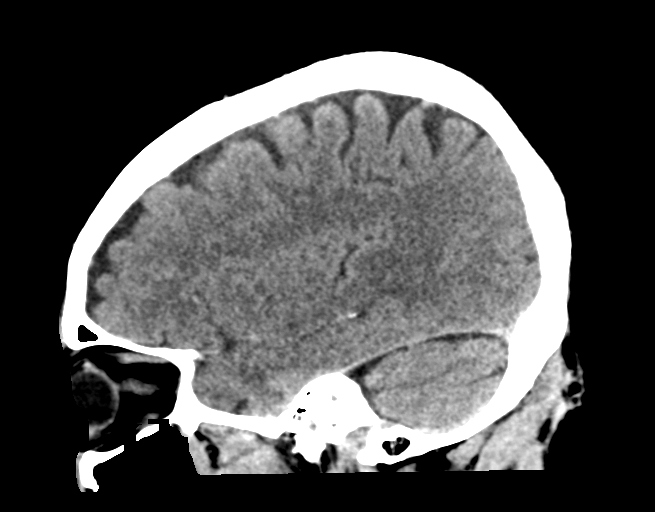
[im 31/62  brain]
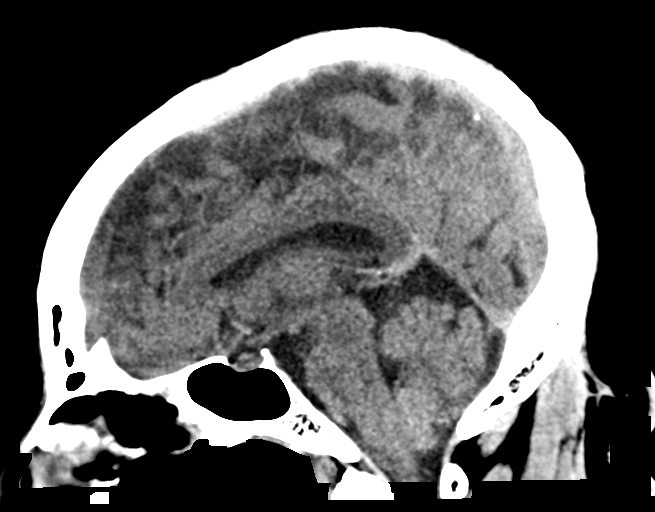
[im 41/62  brain]
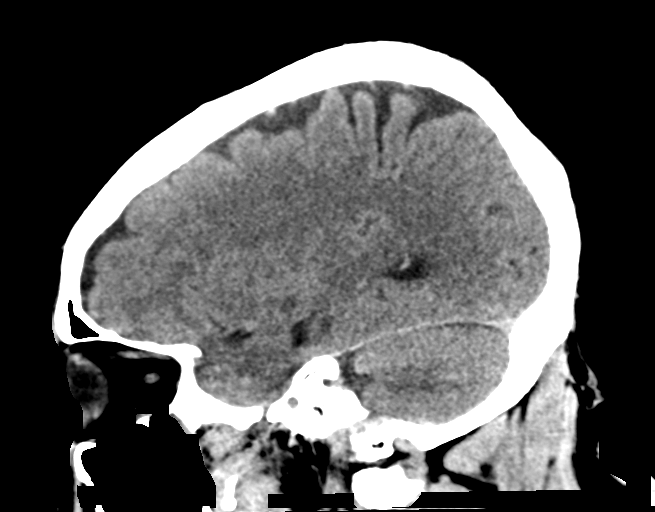

[16 of 47 positions shown; findings below may reference images not displayed]

FINDINGS: Brain: No evidence of acute infarction, hemorrhage, hydrocephalus,
extra-axial collection or mass lesion/mass effect.

Vascular: No hyperdense vessel or unexpected calcification.

Skull: Normal. Negative for fracture or focal lesion.

Sinuses/Orbits: Normal globes and orbits. Single opacified right
ethmoid air cell. Remaining sinuses are clear.

Other: No visualized scalp contusion/hematoma.
IMPRESSION: 1. No intracranial abnormality.  No skull fracture.

## 2023-06-10 IMAGING — DX DG CHEST 1V
1 series · 1 of 1 positions shown · non-contrast
Comparison: 04/26/2006.

CLINICAL DATA: Weakness and cough.

EXAM:
CHEST  1 VIEW

[chest ap]
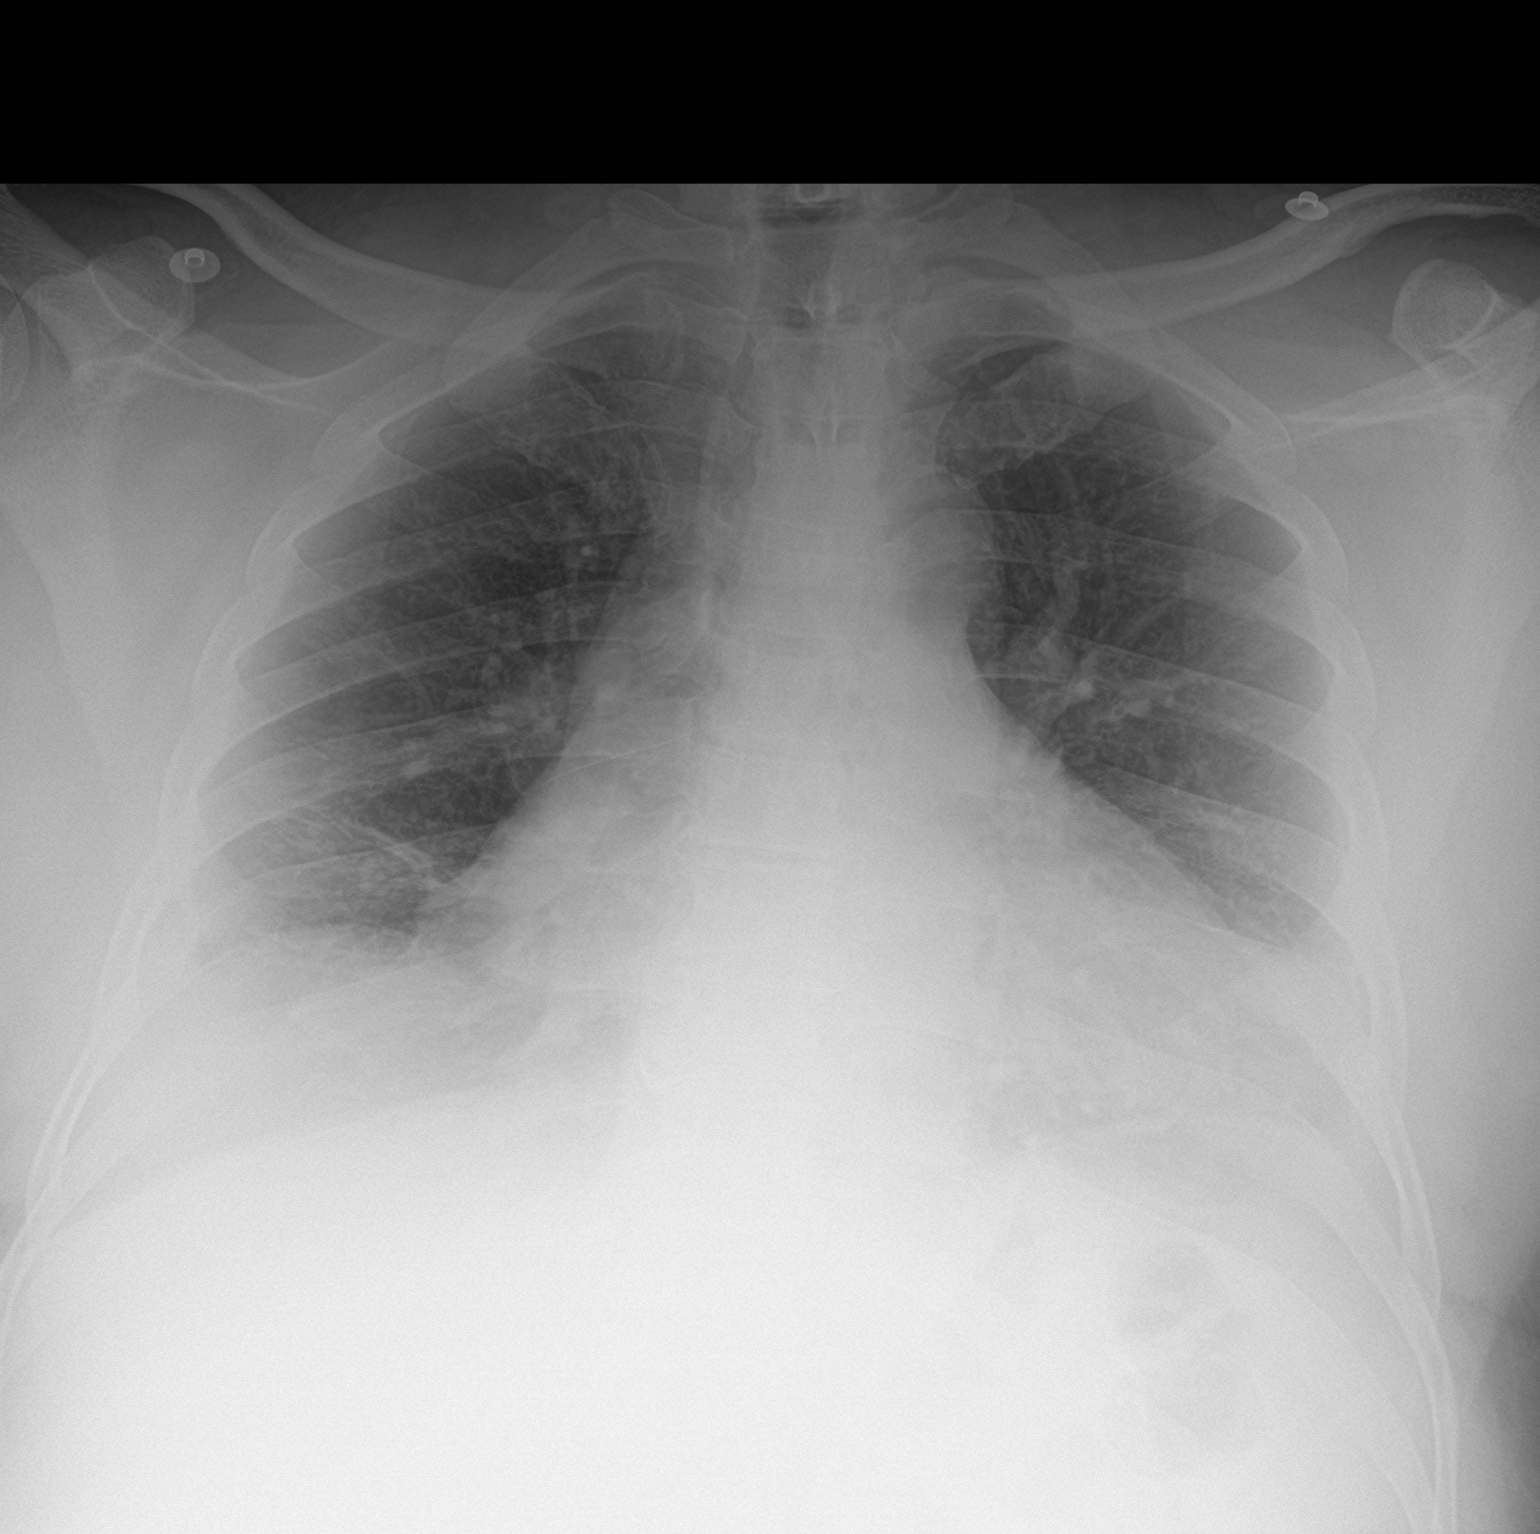

[1 of 1 positions shown; findings below may reference images not displayed]

FINDINGS: The heart is enlarged and mediastinal contours are within normal
limits. Lung volumes are low with scattered airspace disease at the
lung bases. There are small bilateral pleural effusions. No
pneumothorax. No acute osseous abnormality.
IMPRESSION: 1. Cardiomegaly.
2. Patchy airspace disease at the lung bases, possible atelectasis
or infiltrate.
3. Small bilateral pleural effusions.

## 2023-06-16 LAB — HORIZON CUSTOM: REPORT SUMMARY: NEGATIVE

## 2023-10-24 ENCOUNTER — Emergency Department (HOSPITAL_COMMUNITY)

## 2023-10-24 ENCOUNTER — Emergency Department (HOSPITAL_COMMUNITY)
Admission: EM | Admit: 2023-10-24 | Discharge: 2023-10-24 | Disposition: A | Discharge status: Home / Self Care | Attending: Emergency Medicine | Admitting: Emergency Medicine

## 2023-10-24 ENCOUNTER — Encounter (HOSPITAL_COMMUNITY): Payer: Self-pay | Admitting: *Deleted

## 2023-10-24 ENCOUNTER — Other Ambulatory Visit: Payer: Self-pay

## 2023-10-24 DIAGNOSIS — M25561 Pain in right knee: Secondary | ICD-10-CM | POA: Insufficient documentation

## 2023-10-24 MED ORDER — NAPROXEN 250 MG PO TABS
500.0000 mg | ORAL_TABLET | Freq: Once | ORAL | Status: AC
  Administered 2023-10-24: 500 mg via ORAL
  Filled 2023-10-24: qty 2

## 2023-10-24 MED ORDER — NAPROXEN 500 MG PO TABS: 500.0000 mg | ORAL_TABLET | Freq: Two times a day (BID) | ORAL | 0 refills | Status: AC

## 2023-10-24 MED ORDER — IBUPROFEN 400 MG PO TABS
400.0000 mg | ORAL_TABLET | Freq: Once | ORAL | Status: AC
  Administered 2023-10-24: 400 mg via ORAL

## 2023-10-24 MED ORDER — IBUPROFEN 400 MG PO TABS
ORAL_TABLET | ORAL | Status: AC
  Filled 2023-10-24: qty 1

## 2023-10-24 MED ORDER — ACETAMINOPHEN 325 MG PO TABS
650.0000 mg | ORAL_TABLET | Freq: Once | ORAL | Status: AC
  Administered 2023-10-24: 650 mg via ORAL
  Filled 2023-10-24: qty 2

## 2023-10-24 NOTE — ED Provider Notes (Signed)
 [facility] AT [facility] Provider Note   CSN: [id number] Arrival date & time: 10/24/23  0108     Patient presents with: Knee Pain   Tony Nguyen is a 63 y.o. male.   The history is provided by the patient.  Knee Pain  He complains of pain in the medial aspect of his right knee for the last week.  Pain seems to radiate down towards his foot.  It is worse with walking.  He denies any trauma.  He has taken ibuprofen  which has given some slight, temporary relief.    Prior to Admission medications   Medication Sig Start Date End Date Taking? Authorizing Provider  naproxen  (NAPROSYN ) 500 MG tablet Take 1 tablet (500 mg total) by mouth 2 (two) times daily. 10/24/23  Yes Raford Lenis, MD  acetaminophen  (TYLENOL ) 500 MG tablet Take 500 mg by mouth every 6 (six) hours as needed for mild pain.    [provider]  ibuprofen  (ADVIL ) 800 MG tablet Take 1 tablet (800 mg total) by mouth every 8 (eight) hours as needed for moderate pain. 05/05/21   Magnant, Carlin CROME, PA-C  sildenafil  (VIAGRA ) 100 MG tablet Take 0.5-1 tablets (50-100 mg total) by mouth daily as needed for erectile dysfunction. 08/27/20   Brimage, Vondra, DO    Allergies: Pork-derived products    Review of Systems  All other systems reviewed and are negative.   Updated Vital Signs BP (!) 142/79   Pulse (!) 58   Temp 98.3 F (36.8 C)   Resp 20   Ht 6' (1.829 m)   Wt 121.6 kg   SpO2 100%   BMI 36.36 kg/m   Physical Exam Vitals and nursing note reviewed.   63 year old male, resting comfortably and in no acute distress. Vital signs are significant for borderline elevated blood pressure and borderline slow heart rate. Oxygen saturation is 100%, which is normal. Head is normocephalic and atraumatic. PERRLA, EOMI.  Lungs are clear without rales, wheezes, or rhonchi. Chest is nontender. Heart has regular rate and rhythm without murmur. Extremities: Small effusion is present in the  left knee.  There is tenderness palpation over the medial joint line.  Pain is elicited with varus stress but not valgus stress.  There is no instability on valgus or varus stress.  Lachman test is negative, but McMurray's test is suggestive of a click in the medial compartment.  There is no calf tenderness, calf circumferences symmetric.  T[place]a' sign is negative. Skin is warm and dry without rash. Neurologic: Mental status is normal, cranial nerves are intact, moves all extremities equally.   Radiology: DG Knee Complete 4 Views Right Result Date: 10/24/2023 CLINICAL DATA:  Right knee pain for 1 week.  No known injury. EXAM: RIGHT KNEE - COMPLETE 4+ VIEW COMPARISON:  None Available. FINDINGS: No evidence of fracture, dislocation, or joint effusion. No evidence of arthropathy or other focal bone abnormality. Soft tissues are unremarkable. IMPRESSION: Negative. Electronically Signed   By: Francis Quam M.D.   On: 10/24/2023 02:30     Procedures   Medications Ordered in the ED  naproxen  (NAPROSYN ) tablet 500 mg (has no administration in time range)  acetaminophen  (TYLENOL ) tablet 650 mg (has no administration in time range)  ibuprofen  (ADVIL ) tablet 400 mg (400 mg Oral Given 10/24/23 0144)  Medical Decision Making Amount and/or Complexity of Data Reviewed Radiology: ordered.  Risk OTC drugs. Prescription drug management.   Right knee pain with exam suggestive of possible meniscus injury, consider partial ligament tear.  X-rays of the right knee are negative.  I have independently viewed the images, and agree with the radiologist's interpretation.  I have ordered knee immobilizer and crutches and I am discharging him with a prescription for naproxen  and I am referring him to orthopedics for follow-up.  Advised on ice, told to use knee immobilizer and crutches as needed, add acetaminophen  as needed.     Final diagnoses:  Acute pain of right knee     ED Discharge Orders          Ordered    naproxen  (NAPROSYN ) 500 MG tablet  2 times daily        10/24/23 0604               Raford Lenis, MD 10/24/23 [phone]

## 2023-10-24 NOTE — Discharge Instructions (Signed)
 I suspect that you either have a torn ligament or torn cartilage in your knee.  Please apply ice for 30 minutes at a time, 4 times a day.  Wear the knee immobilizer and use the crutches as needed.  Take the naproxen  as prescribed.  You may add acetaminophen  as needed to get additional pain relief.  Please follow-up with the orthopedic doctor for further evaluation.

## 2023-10-24 NOTE — ED Notes (Signed)
 Ortho tech notified on patient's knee immobilizer and crutches orders.

## 2023-10-24 NOTE — ED Triage Notes (Signed)
 Pt in ambulatory w/internal R knee pain down to the bone. Ongoing x 1 wk, denies any known injury

## 2023-10-24 NOTE — ED Notes (Signed)
 Pt sleeping when rounding for vitals.

## 2023-10-24 NOTE — Progress Notes (Signed)
 Orthopedic Tech Progress Note Patient Details:  Tony Nguyen [birth year] [id number]  Ortho Devices Type of Ortho Device: Knee Immobilizer, Crutches Ortho Device/Splint Location: R KNEE Ortho Device/Splint Interventions: Ordered, Application, Adjustment   Post Interventions Patient Tolerated: Well Instructions Provided: Care of device   [name] [name] 10/24/2023, 7:06 AM

## 2024-03-25 ENCOUNTER — Encounter: Payer: Self-pay | Admitting: Family Medicine

## 2024-03-25 NOTE — Progress Notes (Unsigned)
" ° ° °  SUBJECTIVE:   Chief compliant/HPI: annual examination  Tony Nguyen is a 64 y.o. who presents today for an annual exam.   History tabs reviewed and updated.   OBJECTIVE:   There were no vitals taken for this visit.  General: Awake and Alert in NAD HEENT: NCAT. Sclera anicteric. No rhinorrhea. Cardiovascular: RRR. No M/R/G Respiratory: CTAB, normal WOB on RA. No wheezing, crackles, rhonchi, or diminished breath sounds. Abdomen: Soft, non-tender, non-distended. Bowel sounds normoactive/hypoactive/hyperactive. *** Extremities: Able to move all extremities. No BLE edema, no deformities or significant joint findings. Skin: Warm and dry. No abrasions or rashes noted. Neuro: A&Ox***. No focal neurological deficits.  ASSESSMENT/PLAN:   Assessment & Plan  Annual Examination  PHQ score ***, reviewed and discussed.  Blood pressure value is *** goal, discussed.   Considered the following screening exams based upon USPSTF recommendations: Diabetes screening: ordered HIV testing:{FMCANNUALORDERED:33692} Hepatitis C: {FMCANNUALORDERED:33692} Hepatitis B:{FMCANNUALORDERED:33692} Syphilis if at high risk: {FMCANNUALORDERED:33692} GC/CT {GC/CT screening :23818} Lipid panel (nonfasting or fasting) discussed based upon AHA recommendations and ordered.  Consider repeat every 4-6 years.   Cancer Screening Discussion  Lung cancer screening:{FMCLUNGCANCERSCREENIN:33695}.  See documentation below regarding indications/risks/benefits.  Colorectal cancer screening: {crcscreen:23821::discussed, colonoscopy ordered}.  PSA discussed and after engaging in discussion of possible risks, benefits and complications of screening. PSA: {not indicated/requested/declined:14582} Vaccinations: Recommended PNA, COVID and Flu, patient ***.  Recommended Shingles from pharmacy.  Follow up in 1 year or sooner if indicated.  MyChart Activation: Already signed up  Kathrine Melena, DO [facility]  "

## 2024-04-18 ENCOUNTER — Encounter: Admitting: Family Medicine
